# Patient Record
Sex: Female | Born: 1940 | Race: Black or African American | Hispanic: No | Marital: Married | Smoking: Never smoker
Health system: Southern US, Community
[De-identification: ages and names within clinical notes are randomized; demographics above are authoritative.]

## PROBLEM LIST (undated history)

## (undated) DIAGNOSIS — E079 Disorder of thyroid, unspecified: Secondary | ICD-10-CM

## (undated) DIAGNOSIS — G309 Alzheimer's disease, unspecified: Secondary | ICD-10-CM

## (undated) DIAGNOSIS — N189 Chronic kidney disease, unspecified: Secondary | ICD-10-CM

## (undated) DIAGNOSIS — M1990 Unspecified osteoarthritis, unspecified site: Secondary | ICD-10-CM

## (undated) DIAGNOSIS — I10 Essential (primary) hypertension: Secondary | ICD-10-CM

## (undated) DIAGNOSIS — M549 Dorsalgia, unspecified: Secondary | ICD-10-CM

## (undated) DIAGNOSIS — H5462 Unqualified visual loss, left eye, normal vision right eye: Secondary | ICD-10-CM

## (undated) DIAGNOSIS — N390 Urinary tract infection, site not specified: Secondary | ICD-10-CM

## (undated) DIAGNOSIS — E785 Hyperlipidemia, unspecified: Secondary | ICD-10-CM

## (undated) DIAGNOSIS — W19XXXA Unspecified fall, initial encounter: Secondary | ICD-10-CM

## (undated) DIAGNOSIS — R1310 Dysphagia, unspecified: Secondary | ICD-10-CM

## (undated) DIAGNOSIS — S42309A Unspecified fracture of shaft of humerus, unspecified arm, initial encounter for closed fracture: Secondary | ICD-10-CM

## (undated) DIAGNOSIS — E860 Dehydration: Secondary | ICD-10-CM

## (undated) DIAGNOSIS — D509 Iron deficiency anemia, unspecified: Secondary | ICD-10-CM

## (undated) DIAGNOSIS — F0390 Unspecified dementia without behavioral disturbance: Secondary | ICD-10-CM

## (undated) DIAGNOSIS — F0280 Dementia in other diseases classified elsewhere without behavioral disturbance: Secondary | ICD-10-CM

## (undated) DIAGNOSIS — M2550 Pain in unspecified joint: Secondary | ICD-10-CM

## (undated) DIAGNOSIS — R296 Repeated falls: Secondary | ICD-10-CM

## (undated) DIAGNOSIS — R6251 Failure to thrive (child): Secondary | ICD-10-CM

## (undated) DIAGNOSIS — N289 Disorder of kidney and ureter, unspecified: Secondary | ICD-10-CM

## (undated) DIAGNOSIS — M069 Rheumatoid arthritis, unspecified: Secondary | ICD-10-CM

## (undated) HISTORY — DX: Disorder of kidney and ureter, unspecified: N28.9

## (undated) HISTORY — DX: Hyperlipidemia, unspecified: E78.5

## (undated) HISTORY — DX: Pain in unspecified joint: M25.50

## (undated) HISTORY — PX: TOTAL ABDOMINAL HYSTERECTOMY: SHX209

## (undated) HISTORY — DX: Unspecified dementia, unspecified severity, without behavioral disturbance, psychotic disturbance, mood disturbance, and anxiety: F03.90

## (undated) HISTORY — PX: ESOPHAGOGASTRODUODENOSCOPY: SHX1529

## (undated) HISTORY — DX: Unqualified visual loss, left eye, normal vision right eye: H54.62

## (undated) HISTORY — PX: COLONOSCOPY: SHX174

---

## 2001-01-12 ENCOUNTER — Encounter (HOSPITAL_COMMUNITY): Admission: RE | Admit: 2001-01-12 | Discharge: 2001-02-11 | Payer: Self-pay | Admitting: Orthopedic Surgery

## 2001-10-09 ENCOUNTER — Inpatient Hospital Stay (HOSPITAL_COMMUNITY): Admission: AD | Admit: 2001-10-09 | Discharge: 2001-10-13 | Payer: Self-pay | Admitting: Family Medicine

## 2001-10-12 ENCOUNTER — Encounter: Payer: Self-pay | Admitting: Family Medicine

## 2002-01-30 ENCOUNTER — Encounter: Payer: Self-pay | Admitting: Internal Medicine

## 2002-01-30 ENCOUNTER — Emergency Department (HOSPITAL_COMMUNITY): Admission: EM | Admit: 2002-01-30 | Discharge: 2002-01-30 | Payer: Self-pay | Admitting: Internal Medicine

## 2002-03-04 ENCOUNTER — Ambulatory Visit (HOSPITAL_COMMUNITY): Admission: RE | Admit: 2002-03-04 | Discharge: 2002-03-04 | Payer: Self-pay | Admitting: Family Medicine

## 2002-03-04 ENCOUNTER — Encounter: Payer: Self-pay | Admitting: Family Medicine

## 2003-07-07 ENCOUNTER — Encounter (HOSPITAL_COMMUNITY): Admission: RE | Admit: 2003-07-07 | Discharge: 2003-08-06 | Payer: Self-pay | Admitting: Rheumatology

## 2004-10-31 ENCOUNTER — Emergency Department (HOSPITAL_COMMUNITY): Admission: EM | Admit: 2004-10-31 | Discharge: 2004-10-31 | Payer: Self-pay | Admitting: Emergency Medicine

## 2007-01-28 ENCOUNTER — Emergency Department (HOSPITAL_COMMUNITY): Admission: EM | Admit: 2007-01-28 | Discharge: 2007-01-28 | Payer: Self-pay | Admitting: Emergency Medicine

## 2007-10-22 ENCOUNTER — Ambulatory Visit (HOSPITAL_COMMUNITY): Admission: RE | Admit: 2007-10-22 | Discharge: 2007-10-22 | Payer: Self-pay | Admitting: Family Medicine

## 2008-04-15 ENCOUNTER — Encounter: Payer: Self-pay | Admitting: Orthopedic Surgery

## 2008-04-15 ENCOUNTER — Ambulatory Visit (HOSPITAL_COMMUNITY): Admission: RE | Admit: 2008-04-15 | Discharge: 2008-04-15 | Payer: Self-pay | Admitting: Family Medicine

## 2008-05-02 ENCOUNTER — Encounter: Payer: Self-pay | Admitting: Orthopedic Surgery

## 2008-05-03 ENCOUNTER — Ambulatory Visit: Payer: Self-pay | Admitting: Orthopedic Surgery

## 2008-05-03 DIAGNOSIS — M25569 Pain in unspecified knee: Secondary | ICD-10-CM | POA: Insufficient documentation

## 2008-06-19 ENCOUNTER — Emergency Department (HOSPITAL_COMMUNITY): Admission: EM | Admit: 2008-06-19 | Discharge: 2008-06-19 | Payer: Self-pay | Admitting: Emergency Medicine

## 2008-07-22 ENCOUNTER — Emergency Department (HOSPITAL_COMMUNITY): Admission: EM | Admit: 2008-07-22 | Discharge: 2008-07-22 | Payer: Self-pay | Admitting: Emergency Medicine

## 2009-04-11 ENCOUNTER — Encounter (INDEPENDENT_AMBULATORY_CARE_PROVIDER_SITE_OTHER): Payer: Self-pay | Admitting: *Deleted

## 2009-04-19 ENCOUNTER — Encounter: Payer: Self-pay | Admitting: Internal Medicine

## 2009-04-20 ENCOUNTER — Ambulatory Visit: Payer: Self-pay | Admitting: Internal Medicine

## 2009-04-20 ENCOUNTER — Encounter: Payer: Self-pay | Admitting: Internal Medicine

## 2009-04-20 ENCOUNTER — Ambulatory Visit (HOSPITAL_COMMUNITY): Admission: RE | Admit: 2009-04-20 | Discharge: 2009-04-20 | Payer: Self-pay | Admitting: Internal Medicine

## 2009-04-20 DIAGNOSIS — Z8719 Personal history of other diseases of the digestive system: Secondary | ICD-10-CM

## 2009-04-26 ENCOUNTER — Ambulatory Visit (HOSPITAL_COMMUNITY): Admission: RE | Admit: 2009-04-26 | Discharge: 2009-04-26 | Payer: Self-pay | Admitting: Family Medicine

## 2009-04-30 ENCOUNTER — Encounter: Payer: Self-pay | Admitting: Internal Medicine

## 2009-05-17 ENCOUNTER — Ambulatory Visit: Payer: Self-pay | Admitting: Internal Medicine

## 2009-05-17 DIAGNOSIS — A048 Other specified bacterial intestinal infections: Secondary | ICD-10-CM | POA: Insufficient documentation

## 2009-05-18 ENCOUNTER — Encounter: Payer: Self-pay | Admitting: Internal Medicine

## 2009-05-22 DIAGNOSIS — D509 Iron deficiency anemia, unspecified: Secondary | ICD-10-CM

## 2009-06-02 ENCOUNTER — Encounter (INDEPENDENT_AMBULATORY_CARE_PROVIDER_SITE_OTHER): Payer: Self-pay

## 2009-06-06 ENCOUNTER — Ambulatory Visit (HOSPITAL_COMMUNITY): Payer: Self-pay | Admitting: Oncology

## 2009-06-06 ENCOUNTER — Encounter (HOSPITAL_COMMUNITY): Admission: RE | Admit: 2009-06-06 | Discharge: 2009-07-05 | Payer: Self-pay | Admitting: Oncology

## 2009-06-26 ENCOUNTER — Ambulatory Visit (HOSPITAL_COMMUNITY): Admission: RE | Admit: 2009-06-26 | Discharge: 2009-06-26 | Payer: Self-pay | Admitting: Rheumatology

## 2009-09-16 ENCOUNTER — Inpatient Hospital Stay (HOSPITAL_COMMUNITY): Admission: EM | Admit: 2009-09-16 | Discharge: 2009-09-22 | Payer: Self-pay | Admitting: Emergency Medicine

## 2009-09-22 ENCOUNTER — Inpatient Hospital Stay: Admission: AD | Admit: 2009-09-22 | Discharge: 2009-10-23 | Payer: Self-pay | Admitting: Internal Medicine

## 2009-10-23 ENCOUNTER — Ambulatory Visit (HOSPITAL_COMMUNITY): Admission: RE | Admit: 2009-10-23 | Discharge: 2009-10-23 | Payer: Self-pay | Admitting: Internal Medicine

## 2009-11-27 ENCOUNTER — Encounter (HOSPITAL_COMMUNITY): Admission: RE | Admit: 2009-11-27 | Discharge: 2009-12-27 | Payer: Self-pay | Admitting: Specialist

## 2009-12-28 ENCOUNTER — Encounter (HOSPITAL_COMMUNITY): Admission: RE | Admit: 2009-12-28 | Discharge: 2010-01-27 | Payer: Self-pay | Admitting: Specialist

## 2010-03-21 ENCOUNTER — Ambulatory Visit (HOSPITAL_COMMUNITY): Admission: RE | Admit: 2010-03-21 | Discharge: 2010-03-21 | Payer: Self-pay | Admitting: Family Medicine

## 2010-04-02 ENCOUNTER — Ambulatory Visit (HOSPITAL_COMMUNITY): Admission: RE | Admit: 2010-04-02 | Discharge: 2010-04-02 | Payer: Self-pay | Admitting: Family Medicine

## 2010-05-18 ENCOUNTER — Ambulatory Visit (HOSPITAL_COMMUNITY): Admission: RE | Admit: 2010-05-18 | Discharge: 2010-05-18 | Payer: Self-pay | Admitting: Family Medicine

## 2010-06-01 ENCOUNTER — Encounter: Payer: Self-pay | Admitting: Internal Medicine

## 2010-07-05 ENCOUNTER — Ambulatory Visit (HOSPITAL_COMMUNITY): Admission: RE | Admit: 2010-07-05 | Discharge: 2010-07-05 | Payer: Self-pay | Admitting: Family Medicine

## 2010-08-06 ENCOUNTER — Telehealth (INDEPENDENT_AMBULATORY_CARE_PROVIDER_SITE_OTHER): Payer: Self-pay

## 2010-08-08 ENCOUNTER — Encounter (INDEPENDENT_AMBULATORY_CARE_PROVIDER_SITE_OTHER): Payer: Self-pay

## 2010-10-28 ENCOUNTER — Encounter: Payer: Self-pay | Admitting: Family Medicine

## 2010-10-29 ENCOUNTER — Encounter (HOSPITAL_COMMUNITY): Payer: Self-pay | Admitting: Oncology

## 2010-11-06 NOTE — Progress Notes (Signed)
Summary: repeat tcs?  Phone Note Outgoing Call   Summary of Call: pt is on recall list for repeat tcs. pt had tcs/egd 04/2009. When is pt due for repeat tcs? Initial call taken by: Hendricks Limes LPN,  August 06, 2010 2:57 PM     Appended Document: repeat tcs? see opt note append

## 2010-11-06 NOTE — Letter (Signed)
Summary: EMSI  EMSI   Imported By: Rexene Alberts 06/01/2010 15:15:26  _____________________________________________________________________  External Attachment:    Type:   Image     Comment:   External Document

## 2010-11-06 NOTE — Letter (Addendum)
Summary: Plan of Care, Need to Discuss  [facility]  [address]   [place], [place] [address]   Phone: [phone]  Fax: [phone]    August 08, 2010  KAYLAANN MOUNTZ [address] [place], [place]  [address] May 31, 1941   Dear Ms. Glasper,   We are writing this letter since we were unable to reach you by phone. Dr. Jena Gauss would like to know what kind of cancer your family had a history of. Please call us at [phone] as soon as possible and give [place] this information. Thank you very much and look forward to hearing  from you soon.     Sincerely,    Cloria Spring LPN  [facility] Ph: [phone]    Fax: [phone]   Appended Document: Plan of Care, Need to Discuss Per pt's husband no family history of cancer.

## 2010-12-22 LAB — GLUCOSE, CAPILLARY
Glucose-Capillary: 103 mg/dL — ABNORMAL HIGH (ref 70–99)
Glucose-Capillary: 103 mg/dL — ABNORMAL HIGH (ref 70–99)
Glucose-Capillary: 106 mg/dL — ABNORMAL HIGH (ref 70–99)
Glucose-Capillary: 106 mg/dL — ABNORMAL HIGH (ref 70–99)
Glucose-Capillary: 118 mg/dL — ABNORMAL HIGH (ref 70–99)
Glucose-Capillary: 130 mg/dL — ABNORMAL HIGH (ref 70–99)
Glucose-Capillary: 138 mg/dL — ABNORMAL HIGH (ref 70–99)
Glucose-Capillary: 140 mg/dL — ABNORMAL HIGH (ref 70–99)
Glucose-Capillary: 152 mg/dL — ABNORMAL HIGH (ref 70–99)
Glucose-Capillary: 152 mg/dL — ABNORMAL HIGH (ref 70–99)
Glucose-Capillary: 188 mg/dL — ABNORMAL HIGH (ref 70–99)
Glucose-Capillary: 243 mg/dL — ABNORMAL HIGH (ref 70–99)
Glucose-Capillary: 75 mg/dL (ref 70–99)
Glucose-Capillary: 89 mg/dL (ref 70–99)
Glucose-Capillary: 91 mg/dL (ref 70–99)
Glucose-Capillary: 92 mg/dL (ref 70–99)

## 2010-12-23 LAB — GLUCOSE, CAPILLARY: Glucose-Capillary: 104 mg/dL — ABNORMAL HIGH (ref 70–99)

## 2011-01-07 LAB — GLUCOSE, CAPILLARY
Glucose-Capillary: 103 mg/dL — ABNORMAL HIGH (ref 70–99)
Glucose-Capillary: 116 mg/dL — ABNORMAL HIGH (ref 70–99)
Glucose-Capillary: 117 mg/dL — ABNORMAL HIGH (ref 70–99)
Glucose-Capillary: 119 mg/dL — ABNORMAL HIGH (ref 70–99)
Glucose-Capillary: 122 mg/dL — ABNORMAL HIGH (ref 70–99)
Glucose-Capillary: 123 mg/dL — ABNORMAL HIGH (ref 70–99)
Glucose-Capillary: 128 mg/dL — ABNORMAL HIGH (ref 70–99)
Glucose-Capillary: 141 mg/dL — ABNORMAL HIGH (ref 70–99)
Glucose-Capillary: 154 mg/dL — ABNORMAL HIGH (ref 70–99)
Glucose-Capillary: 158 mg/dL — ABNORMAL HIGH (ref 70–99)
Glucose-Capillary: 161 mg/dL — ABNORMAL HIGH (ref 70–99)
Glucose-Capillary: 165 mg/dL — ABNORMAL HIGH (ref 70–99)
Glucose-Capillary: 188 mg/dL — ABNORMAL HIGH (ref 70–99)
Glucose-Capillary: 191 mg/dL — ABNORMAL HIGH (ref 70–99)
Glucose-Capillary: 204 mg/dL — ABNORMAL HIGH (ref 70–99)
Glucose-Capillary: 212 mg/dL — ABNORMAL HIGH (ref 70–99)
Glucose-Capillary: 219 mg/dL — ABNORMAL HIGH (ref 70–99)
Glucose-Capillary: 226 mg/dL — ABNORMAL HIGH (ref 70–99)
Glucose-Capillary: 233 mg/dL — ABNORMAL HIGH (ref 70–99)
Glucose-Capillary: 268 mg/dL — ABNORMAL HIGH (ref 70–99)
Glucose-Capillary: 45 mg/dL — ABNORMAL LOW (ref 70–99)
Glucose-Capillary: 74 mg/dL (ref 70–99)
Glucose-Capillary: 86 mg/dL (ref 70–99)
Glucose-Capillary: 88 mg/dL (ref 70–99)
Glucose-Capillary: 95 mg/dL (ref 70–99)
Glucose-Capillary: 97 mg/dL (ref 70–99)
Glucose-Capillary: 120 mg/dL — ABNORMAL HIGH (ref 70–99)
Glucose-Capillary: 169 mg/dL — ABNORMAL HIGH (ref 70–99)
Glucose-Capillary: 266 mg/dL — ABNORMAL HIGH (ref 70–99)
Glucose-Capillary: 74 mg/dL (ref 70–99)
Glucose-Capillary: 83 mg/dL (ref 70–99)

## 2011-01-07 LAB — BASIC METABOLIC PANEL
CO2: 20 mEq/L (ref 19–32)
Calcium: 8.9 mg/dL (ref 8.4–10.5)
Chloride: 111 mEq/L (ref 96–112)
GFR calc Af Amer: 43 mL/min — ABNORMAL LOW (ref >60)
Glucose, Bld: 98 mg/dL (ref 70–99)
Potassium: 4.3 mEq/L (ref 3.5–5.1)
Sodium: 139 mEq/L (ref 135–145)

## 2011-01-08 LAB — BASIC METABOLIC PANEL WITH GFR
BUN: 22 mg/dL (ref 6–23)
CO2: 21 meq/L (ref 19–32)
Calcium: 9.1 mg/dL (ref 8.4–10.5)
Chloride: 103 meq/L (ref 96–112)
Creatinine, Ser: 1.59 mg/dL — ABNORMAL HIGH (ref 0.4–1.2)
GFR calc non Af Amer: 32 mL/min — ABNORMAL LOW
Glucose, Bld: 549 mg/dL (ref 70–99)
Potassium: 5.4 meq/L — ABNORMAL HIGH (ref 3.5–5.1)
Sodium: 133 meq/L — ABNORMAL LOW (ref 135–145)

## 2011-01-08 LAB — GLUCOSE, CAPILLARY
Glucose-Capillary: 105 mg/dL — ABNORMAL HIGH (ref 70–99)
Glucose-Capillary: 110 mg/dL — ABNORMAL HIGH (ref 70–99)
Glucose-Capillary: 123 mg/dL — ABNORMAL HIGH (ref 70–99)
Glucose-Capillary: 126 mg/dL — ABNORMAL HIGH (ref 70–99)
Glucose-Capillary: 129 mg/dL — ABNORMAL HIGH (ref 70–99)
Glucose-Capillary: 131 mg/dL — ABNORMAL HIGH (ref 70–99)
Glucose-Capillary: 151 mg/dL — ABNORMAL HIGH (ref 70–99)
Glucose-Capillary: 155 mg/dL — ABNORMAL HIGH (ref 70–99)
Glucose-Capillary: 172 mg/dL — ABNORMAL HIGH (ref 70–99)
Glucose-Capillary: 178 mg/dL — ABNORMAL HIGH (ref 70–99)
Glucose-Capillary: 211 mg/dL — ABNORMAL HIGH (ref 70–99)
Glucose-Capillary: 241 mg/dL — ABNORMAL HIGH (ref 70–99)
Glucose-Capillary: 244 mg/dL — ABNORMAL HIGH (ref 70–99)
Glucose-Capillary: 248 mg/dL — ABNORMAL HIGH (ref 70–99)
Glucose-Capillary: 299 mg/dL — ABNORMAL HIGH (ref 70–99)
Glucose-Capillary: 436 mg/dL — ABNORMAL HIGH (ref 70–99)
Glucose-Capillary: 455 mg/dL — ABNORMAL HIGH (ref 70–99)
Glucose-Capillary: 564 mg/dL (ref 70–99)
Glucose-Capillary: 95 mg/dL (ref 70–99)
Glucose-Capillary: 96 mg/dL (ref 70–99)
Glucose-Capillary: 97 mg/dL (ref 70–99)

## 2011-01-08 LAB — COMPREHENSIVE METABOLIC PANEL
ALT: 14 U/L (ref 0–35)
AST: 17 U/L (ref 0–37)
Albumin: 2.9 g/dL — ABNORMAL LOW (ref 3.5–5.2)
Alkaline Phosphatase: 99 U/L (ref 39–117)
Chloride: 113 mEq/L — ABNORMAL HIGH (ref 96–112)
Creatinine, Ser: 1.3 mg/dL — ABNORMAL HIGH (ref 0.4–1.2)
GFR calc Af Amer: 50 mL/min — ABNORMAL LOW (ref >60)
Potassium: 4.6 mEq/L (ref 3.5–5.1)
Sodium: 141 mEq/L (ref 135–145)
Total Bilirubin: 0.6 mg/dL (ref 0.3–1.2)

## 2011-01-08 LAB — CBC
HCT: 27 % — ABNORMAL LOW (ref 36.0–46.0)
HCT: 31.2 % — ABNORMAL LOW (ref 36.0–46.0)
Hemoglobin: 10.6 g/dL — ABNORMAL LOW (ref 12.0–15.0)
Hemoglobin: 9.3 g/dL — ABNORMAL LOW (ref 12.0–15.0)
MCHC: 34.1 g/dL (ref 30.0–36.0)
MCHC: 34.3 g/dL (ref 30.0–36.0)
MCV: 83.6 fL (ref 78.0–100.0)
MCV: 83.8 fL (ref 78.0–100.0)
Platelets: 214 K/uL (ref 150–400)
Platelets: 234 10*3/uL (ref 150–400)
RBC: 3.23 MIL/uL — ABNORMAL LOW (ref 3.87–5.11)
RBC: 3.72 MIL/uL — ABNORMAL LOW (ref 3.87–5.11)
RDW: 14.1 % (ref 11.5–15.5)
RDW: 14.1 % (ref 11.5–15.5)
WBC: 13.8 K/uL — ABNORMAL HIGH (ref 4.0–10.5)
WBC: 14.5 10*3/uL — ABNORMAL HIGH (ref 4.0–10.5)

## 2011-01-08 LAB — URINALYSIS, ROUTINE W REFLEX MICROSCOPIC
Bilirubin Urine: NEGATIVE
Glucose, UA: >1000 mg/dL — AB
Hgb urine dipstick: NEGATIVE
Ketones, ur: NEGATIVE mg/dL
Nitrite: NEGATIVE
Protein, ur: NEGATIVE mg/dL
Specific Gravity, Urine: 1.017 (ref 1.005–1.030)
Urobilinogen, UA: 0.2 mg/dL (ref 0.0–1.0)
pH: 6 (ref 5.0–8.0)

## 2011-01-08 LAB — DIFFERENTIAL
Basophils Absolute: 0 10*3/uL (ref 0.0–0.1)
Eosinophils Absolute: 0 10*3/uL (ref 0.0–0.7)
Lymphocytes Relative: 7 % — ABNORMAL LOW (ref 12–46)
Lymphs Abs: 1.1 10*3/uL (ref 0.7–4.0)
Neutrophils Relative %: 88 % — ABNORMAL HIGH (ref 43–77)

## 2011-01-08 LAB — BASIC METABOLIC PANEL
BUN: 29 mg/dL — ABNORMAL HIGH (ref 6–23)
Calcium: 9.2 mg/dL (ref 8.4–10.5)
Chloride: 112 mEq/L (ref 96–112)
GFR calc Af Amer: 40 mL/min — ABNORMAL LOW (ref >60)
GFR calc non Af Amer: 23 mL/min — ABNORMAL LOW (ref >60)
Glucose, Bld: 153 mg/dL — ABNORMAL HIGH (ref 70–99)
Potassium: 4.4 mEq/L (ref 3.5–5.1)

## 2011-01-08 LAB — LIPID PANEL
Cholesterol: 181 mg/dL (ref 0–200)
LDL Cholesterol: 109 mg/dL — ABNORMAL HIGH (ref 0–99)
Total CHOL/HDL Ratio: 3.4 RATIO
Triglycerides: 92 mg/dL (ref <150)

## 2011-01-08 LAB — GLUCOSE, RANDOM: Glucose, Bld: 432 mg/dL — ABNORMAL HIGH (ref 70–99)

## 2011-01-08 LAB — HEMOGLOBIN A1C: Hgb A1c MFr Bld: 12.3 % — ABNORMAL HIGH (ref 4.6–6.1)

## 2011-01-08 LAB — RETICULOCYTES
RBC.: 3.4 MIL/uL — ABNORMAL LOW (ref 3.87–5.11)
Retic Count, Absolute: 54.4 K/uL (ref 19.0–186.0)
Retic Ct Pct: 1.6 % (ref 0.4–3.1)

## 2011-01-08 LAB — TSH: TSH: 1.706 u[IU]/mL (ref 0.350–4.500)

## 2011-01-08 LAB — URINE MICROSCOPIC-ADD ON

## 2011-01-08 LAB — KETONES, QUALITATIVE: Acetone, Bld: NEGATIVE

## 2011-01-08 LAB — FERRITIN: Ferritin: 121 ng/mL (ref 10–291)

## 2011-01-08 LAB — HIV ANTIBODY (ROUTINE TESTING W REFLEX): HIV: NONREACTIVE

## 2011-01-10 ENCOUNTER — Other Ambulatory Visit (HOSPITAL_COMMUNITY): Payer: Self-pay | Admitting: Family Medicine

## 2011-01-10 DIAGNOSIS — F09 Unspecified mental disorder due to known physiological condition: Secondary | ICD-10-CM

## 2011-01-12 LAB — FERRITIN: Ferritin: 134 ng/mL (ref 10–291)

## 2011-01-12 LAB — COMPREHENSIVE METABOLIC PANEL
ALT: 18 U/L (ref 0–35)
AST: 23 U/L (ref 0–37)
Albumin: 3.7 g/dL (ref 3.5–5.2)
CO2: 21 mEq/L (ref 19–32)
Calcium: 9.8 mg/dL (ref 8.4–10.5)
GFR calc Af Amer: 51 mL/min — ABNORMAL LOW (ref >60)
GFR calc non Af Amer: 42 mL/min — ABNORMAL LOW (ref >60)
Sodium: 142 mEq/L (ref 135–145)
Total Protein: 7.8 g/dL (ref 6.0–8.3)

## 2011-01-12 LAB — DIFFERENTIAL
Eosinophils Absolute: 0.1 10*3/uL (ref 0.0–0.7)
Eosinophils Relative: 1 % (ref 0–5)
Lymphs Abs: 4.9 10*3/uL — ABNORMAL HIGH (ref 0.7–4.0)
Monocytes Absolute: 0.6 10*3/uL (ref 0.1–1.0)
Monocytes Relative: 4 % (ref 3–12)

## 2011-01-12 LAB — RETICULOCYTES
RBC.: 3.81 MIL/uL — ABNORMAL LOW (ref 3.87–5.11)
Retic Ct Pct: 0.9 % (ref 0.4–3.1)

## 2011-01-12 LAB — IRON AND TIBC
Saturation Ratios: 26 % (ref 20–55)
UIBC: 231 ug/dL

## 2011-01-12 LAB — CBC
MCHC: 34.4 g/dL (ref 30.0–36.0)
Platelets: 279 10*3/uL (ref 150–400)
RBC: 3.81 MIL/uL — ABNORMAL LOW (ref 3.87–5.11)

## 2011-01-12 LAB — HEMOGLOBINOPATHY EVALUATION
Hgb A2 Quant: 2.9 % (ref 2.2–3.2)
Hgb S Quant: 39.3 % — ABNORMAL HIGH (ref 0.0–0.0)

## 2011-01-12 LAB — LACTATE DEHYDROGENASE: LDH: 164 U/L (ref 94–250)

## 2011-01-12 LAB — DIRECT ANTIGLOBULIN TEST (NOT AT ARMC)
DAT, IgG: NEGATIVE
DAT, complement: NEGATIVE

## 2011-01-12 LAB — HAPTOGLOBIN: Haptoglobin: 321 mg/dL — ABNORMAL HIGH (ref 16–200)

## 2011-01-17 ENCOUNTER — Ambulatory Visit (HOSPITAL_COMMUNITY): Payer: Medicare Other

## 2011-01-24 ENCOUNTER — Encounter (HOSPITAL_COMMUNITY): Payer: Self-pay

## 2011-01-24 ENCOUNTER — Ambulatory Visit (HOSPITAL_COMMUNITY)
Admission: RE | Admit: 2011-01-24 | Discharge: 2011-01-24 | Disposition: A | Discharge status: Home / Self Care | Payer: Medicare Other | Source: Ambulatory Visit | Attending: Family Medicine | Admitting: Family Medicine

## 2011-01-24 DIAGNOSIS — G319 Degenerative disease of nervous system, unspecified: Secondary | ICD-10-CM | POA: Insufficient documentation

## 2011-01-24 DIAGNOSIS — E119 Type 2 diabetes mellitus without complications: Secondary | ICD-10-CM | POA: Insufficient documentation

## 2011-01-24 DIAGNOSIS — R4182 Altered mental status, unspecified: Secondary | ICD-10-CM | POA: Insufficient documentation

## 2011-01-24 DIAGNOSIS — F09 Unspecified mental disorder due to known physiological condition: Secondary | ICD-10-CM

## 2011-02-19 NOTE — Op Note (Signed)
Caitlin Riddle, Caitlin Riddle             ACCOUNT NO.:  [id number]   MEDICAL RECORD NO.:  [id number]          PATIENT TYPE:  AMB   LOCATION:  DAY                           FACILITY:  [facility]   PHYSICIAN:  R. Roetta Sessions, M.D. DATE OF BIRTH:  07-Aug-1941   DATE OF PROCEDURE:  04/20/2009  DATE OF DISCHARGE:                               OPERATIVE REPORT   PROCEDURES:  Diagnostic colonoscopy followed by EGD biopsy.   INDICATIONS FOR PROCEDURE:  A 70 year old [place] American female with  iron-deficiency anemia, some weight loss and vague early satiety  symptoms.  She is not experiencing melena, hematemesis or hematochezia.  Colonoscopy is now being done, with EGD to follow.  The risks, benefits,  alternatives, and limitations have been discussed and questions  answered.  Please see the documentation in the medical record.   PROCEDURE NOTE:  Her O2 saturation, blood pressure, pulse, and  respirations were monitored throughout the entire procedure.   MEDICATIONS:  Conscious sedation Versed 6 mg IV and Demerol 75 mg IV in  divided doses.   INSTRUMENT:  Pentax video chip system.   FINDINGS ON COLONOSCOPY:  Digital rectal exam revealed no abnormalities.  The prep was good.  The colonic mucosa was surveyed from the  rectosigmoid junction through the left transverse and right colon, the  appendiceal orifice, ileocecal valve/cecum.  These structures were well  seen and photographed for the record.  From this level, the scope was  slowly and cautiously withdrawn.  All previously mentioned mucosal  surfaces were again seen.  The colonic mucosa appeared normal.  I did  not note any diverticula, although some diverticula were noted on the  left side on a prior exam some 10 years ago.  The scope was pulled down  into the rectum, with thorough examination of the rectal mucosa,  including retroflexed view of the anal verge demonstrated no  abnormalities.  Cecal withdrawal time was 7 minutes.   EGD:   Subsequent EGD has been performed.  Examination of the tubular  esophagus revealed no mucosal abnormalities and was easily traversed  into the stomach and the gastric cavity was empty and insufflated well  with air.  Thorough examination of the gastric mucosa, including  retroflexion of proximal stomach and esophagogastric junction  demonstrated only a small hiatal hernia and a couple of tiny antral  erosions.  There was no ulcer infiltrating process.  Pylorus patent,  easily traversed and examination of the bulb and second portion revealed  no abnormalities.  Therapeutic/diagnostic maneuvers were performed.  Antral biopsies were taken for histologic study.  The patient tolerated  both procedures and was reactive after endoscopy.   IMPRESSION:  1. Colonoscopy:  Normal rectum. Normal colon.  2. EGD:  Normal esophagus.  Small hiatal hernia.  Tiny antral erosions      of uncertain significance, status post biopsy.  Otherwise normal      stomach, patent pylorus, normal duodenum.   RECOMMENDATIONS:  1. Follow up on path.  2. Further recommendations to follow.      Jonathon Bellows, M.D.  Electronically Signed     RMR/MEDQ  D:  04/20/2009  T:  04/20/2009  Job:  [id number]   cc:   Lorin Picket A. Gerda Diss, MD  Fax: [phone]

## 2011-02-22 NOTE — Discharge Summary (Signed)
[facility]  Patient:    Caitlin Riddle, Caitlin Riddle Visit Number: [id number] MRN: [id number]          Service Type: MED Location: 3A A326 01 Attending Physician:  Harlow Asa Dictated by:   Lilyan Punt, M.D. Admit Date:  10/09/2001 Discharge Date: 10/13/2001                             Discharge Summary  DISCHARGE DIAGNOSIS:  Pyelonephritis.  HOSPITAL COURSE:  A 70 year old black female with diabetes admitted after a several day history of back pain, fever, vomiting, and weakness.  She had been seen approximately 1 week prior to admission in the hospital in [place] and they diagnosed a UTI for which she was given IV fluids and started on Cipro 500 b.i.d.  She had taken 1 weeks worth of this when she presented to the office in fever and discomfort and distress.  She had a temperature of 100.2, and she was felt to be mildly dehydrated and also had flank tenderness but the abdomen itself was soft.  WBC initially 20,000 with a left shift and urinalysis showed multiple WBCs. Urine culture came back being negative but it should be of note that the patient had been on Cipro for 1 week before this was taken.  She did have an ultrasound while in the hospital and that did not show any perinephric abscess and did not show any gallstones.  It did show some small hepatic cysts of nonconsequence.  WBCs were repeated several times and showed a moderate decline to 13,000 on the time of discharge.  The patient was afebrile at the time of discharge.  MEDICATIONS: 1. She was instructed to take Augmentin 875 b.i.d. with a snack for 7 days    along with her 2. Glynase 3 mg q.a.m. and 1/2 of this q. supper. Once her sugars have come    down to a range she normally has them she is to revert back to just Glynase    3 mg q.a.m. 3. The lipitor she may restart in 1 week.  FOLLOWUP:  She is to follow up in the office in approximately 2 weeks or sooner if any problems. Dictated  by:   Lilyan Punt, M.D. Attending Physician:  Harlow Asa DD:  10/13/01 TD:  10/13/01 Job: [id number]

## 2011-02-22 NOTE — Consult Note (Signed)
NAMEKAROLYNE, TIMMONS                         ACCOUNT NO.:  [id number]   MEDICAL RECORD NO.:  [id number]                  PATIENT TYPE:   LOCATION:                                       FACILITY:   PHYSICIAN:  Aundra Dubin, M.D.            DATE OF BIRTH:   DATE OF CONSULTATION:  07/07/2003  DATE OF DISCHARGE:                                   CONSULTATION   CHIEF COMPLAINT:  Hands and wrists.   HISTORY:  Dr. Lorin Picket,  Thank you for allowing me to help in Ms. Bina's care.  She is a 70-year-  old black female with a history of NIDDM.  In June, she started becoming  sore and aching in the sides of her neck, shoulders, and both hands and  wrists.  By early August, she was seen by Dr. Gerda Diss who felt that she had  arthritis, particularly involving her hands.  There was concern that this  may be RA.  He placed her on prednisone at a small dose with some  improvement, but she continued to have some achiness and soreness.  Approximately 10 days ago, she had to go to an emergency room because she  was having hives, itching, throat and tongue swelling, along with her lips  swelling.  There was no shortness of breath.  An IV was placed and she was  given some medicine which I suspect contained steroids.  She was felt to  have an allergic reaction, but the cause was not known.  She is some better  at this time.  Laboratories on May 09, 2003, showed a negative RF and an  ESR 99.   REVIEW OF SYSTEMS:  On further review of systems, she does not feel she has  gained any appreciable weight, but does feel puffy.  Her energy level is  somewhat low.  She has some nonrestorative sleep.  She is stiff in the  mornings for about an hour.  She denies fever or rashes, other than the  hives from last week.  There has been no psoriasis, photosensitivity, oral  ulcers, alopecia, pleurisy, headaches, or Raynaud's.  She has some  constipation, but denies diarrhea or blood in the mucous.  She has had  no  chest pain.  Her feet, ankles, and knees do not bother her.   PAST MEDICAL AND SURGICAL HISTORY:  Hysterectomy, diabetes for nine years,  arthritis as above.   MEDICATIONS:  1. Prednisone 5 mg daily.  2. Lipitor 20 mg daily.  3. Avandia 4 mg daily.  4. Naproxen sodium p.r.n.  5. Calcium 600 mg b.i.d.   DRUG INTOLERANCES:  None.   FAMILY HISTORY:  Her father died of some type of cancer.  Her mother is also  deceased with complications of diabetes.  She has three brothers and one  sister who have had various cancers.   SOCIAL HISTORY:  She is here today with her husband.  She has one child from  a previous relationship.  She has been laid of from [employer] and  is not presently working.  She did not smoke heavily and has not smoked any  for greater than 17 years.  No alcohol.   PHYSICAL EXAMINATION:  VITAL SIGNS:  Weight 143 pounds, height 5 feet 2  inches, blood pressure 120/70, respirations 16.  GENERAL:  She is mildly overweight, but is in no distress.  SKIN:  There are no active rashes and the skin is clear.  No hives.  HEENT:  EOMI.  Mouth clear.  NECK:  No adenopathy, normal thyroid.  LUNGS:  Clear.  HEART:  Regular, no murmur.  ABDOMEN:  Negative HSM, nontender.  MUSCULOSKELETAL:  The hands have a swollen appearance.  They are not overtly  swollen, but there is puffiness.  She is tender across the MCPs bilaterally,  and the wrists also have slight warmth, fullness, and tenderness.  Elbows,  good range of motion.  The shoulders move stiffly.  She can perform internal  and external rotations almost fully.  Abduction is stiff, uncomfortable, and  limited to 130 degrees bilaterally.  Trigger points around the shoulder,  neck, occiput, anterior chest, and upper paraspinous muscles were nontender.  Low back was nontender.  Hips, good range of motion.  The knees were cool  and flexed easily to 135 degrees.  Ankles were nonswollen and nontender.  There was no  tenderness with compression across the MTPs.  NEURO:  Strength is 5/5, DTRs are 2+ throughout, negative SLRs.   ASSESSMENT AND PLAN:  1. Symmetric polyarthritis.  The pattern that I see involves primarily today     hands, wrists, and shoulders to some degree.  She is likely better than I     would have seen a few days ago and she has received a steroid injection.     I have explained to them that I would need to see the swelling more fully     in a pattern that is consistent with rheumatoid arthritis before I would     treat it more vigorously.  The plan is for her to stay on prednisone 5 mg     daily until July 17, 2003.  She will then take 5 mg every other day     for four doses and stop the medicine.  The understanding in knowing that     she may worsen to a degree, but I have explained that if this is     rheumatoid arthritis, then at the point that we change the medicine, she     will likely feel better in a few days.  If this is consistent with     rheumatoid arthritis, then we will move on to a remitting medicine.  I     will not x-ray the hands at this time.  2. Diabetes.  This is always problematic with the potential use of     prednisone.  3. Hysterectomy.  She is taking calcium.   DISCUSSION:  Lorin Picket, as you known, Ms. Engdahl is coming to [place] from  [place].  My clinic is stopping at the end of October, which I have  explained this to them.  I am setting them up for a return visit with me in  [place] for early November.  If she worsens before then, then I may see  her at [facility].  I have also discussed, with them, that there is a  rheumatologist in  [place], and, if needed, she could follow up with him.  I  will be glad to work with them and appreciate the opportunity to do so.   Thank you, sincerely,      ___________________________________________                                           Aundra Dubin, M.D.   WWT/MEDQ  D:  07/07/2003  T:  07/07/2003   Job:  [id number]

## 2011-02-22 NOTE — H&P (Signed)
[facility]  Patient:    Caitlin Riddle, Caitlin Riddle Visit Number: [id number] MRN: [id number]          Service Type: MED Location: 3A A326 01 Attending Physician:  Harlow Asa Dictated by:   Donna Bernard, M.D. Admit Date:  10/09/2001                           History and Physical  CHIEF COMPLAINT:  Back pain, fever, vomiting, weakness.  HISTORY OF PRESENT ILLNESS:  This patient is a 70 year old black female, with a history of type 2 diabetes, hyperlipidemia, who presented to the office in significant distress.  She notes approximately a week prior to admission she went to the hospital in [place], [place].  They diagnosed urinary tract infection.  She was given IV fluids and started on Cipro 500 mg b.i.d.  She has taken a one week course, finished her last tablet this morning.  She states she is feeling terrible.  She has had episodes of vomiting and nausea. She notes frequent urination.  Her sugars are in poor control, often ranging over 400.  The patient has felt achy and extremely weak.  The patient claims compliance with medications.  MEDICATIONS:  1.  Glynase 3 mg p.o. q.d.  2.  Lipitor 20 mg p.o. q.d.  3.  Cipro 500 mg b.i.d. last seven days.  PAST SURGICAL HISTORY:  1.  Remote oophorectomy.  2.  Hysterectomy.  FAMILY HISTORY:  Positive for hypertension, diabetes, and colon cancer.  SOCIAL HISTORY:  The patient is married and has one child.  No tobacco use. No alcohol use.  REVIEW OF SYSTEMS:  Otherwise negative.  PHYSICAL EXAMINATION:  VITAL SIGNS:  TEMP 100.2 degrees.  GENERAL:  The patient is alert, with significant malaise.  Oriented x 3.  HEENT:  Normal.  Mucous membranes quite dry.  NECK:  Supple.  LUNGS:  Clear.  HEART:  Slight tachycardia.  No significant murmurs.  Significant right CVA tenderness.  Minimal left CVA tenderness.  ABDOMEN:  Bowel sounds present.  No organomegaly.  No rebound.  No guarding.  SKIN:  Dry  and tenting.  EXTREMITIES:  Thin.  LABORATORY DATA:  WBC 20,000 with left shift.  Urinalysis, multiple wbc/hpf. Blood culture, urine culture obtained.  MET 7 potassium normal.  Please see chart for further numbers.  IMPRESSION:  1.  Pyelonephritis with failure on outpatient management.  2.  Type 2 diabetes, poor control, aggravating management of #1.  3.  Dehydration secondary to #1 and #2.  4.  Hyperlipidemia.  PLAN:  Admit for IV fluids, IV antibiotics.  Further orders as noted on the chart. Dictated by:   Donna Bernard, M.D. Attending Physician:  Harlow Asa DD:  10/09/01 TD:  10/10/01 Job: [id number]

## 2011-11-12 ENCOUNTER — Other Ambulatory Visit: Payer: Self-pay | Admitting: Family Medicine

## 2011-11-12 ENCOUNTER — Ambulatory Visit (HOSPITAL_COMMUNITY)
Admission: RE | Admit: 2011-11-12 | Discharge: 2011-11-12 | Disposition: A | Discharge status: Home / Self Care | Payer: Medicare Other | Source: Ambulatory Visit | Attending: Family Medicine | Admitting: Family Medicine

## 2011-11-12 DIAGNOSIS — R05 Cough: Secondary | ICD-10-CM | POA: Insufficient documentation

## 2011-11-12 DIAGNOSIS — R059 Cough, unspecified: Secondary | ICD-10-CM | POA: Insufficient documentation

## 2011-11-12 DIAGNOSIS — J189 Pneumonia, unspecified organism: Secondary | ICD-10-CM

## 2011-11-12 DIAGNOSIS — E119 Type 2 diabetes mellitus without complications: Secondary | ICD-10-CM | POA: Insufficient documentation

## 2011-12-10 ENCOUNTER — Emergency Department (HOSPITAL_COMMUNITY): Payer: Medicare Other

## 2011-12-10 ENCOUNTER — Encounter (HOSPITAL_COMMUNITY): Payer: Self-pay

## 2011-12-10 ENCOUNTER — Inpatient Hospital Stay (HOSPITAL_COMMUNITY)
Admission: EM | Admit: 2011-12-10 | Discharge: 2011-12-12 | DRG: 690 | Disposition: A | Discharge status: Home / Self Care | Payer: Medicare Other | Attending: Internal Medicine | Admitting: Internal Medicine

## 2011-12-10 DIAGNOSIS — M1711 Unilateral primary osteoarthritis, right knee: Secondary | ICD-10-CM

## 2011-12-10 DIAGNOSIS — N390 Urinary tract infection, site not specified: Principal | ICD-10-CM | POA: Diagnosis present

## 2011-12-10 DIAGNOSIS — D72829 Elevated white blood cell count, unspecified: Secondary | ICD-10-CM | POA: Diagnosis present

## 2011-12-10 DIAGNOSIS — M25569 Pain in unspecified knee: Secondary | ICD-10-CM | POA: Diagnosis present

## 2011-12-10 DIAGNOSIS — E119 Type 2 diabetes mellitus without complications: Secondary | ICD-10-CM | POA: Diagnosis present

## 2011-12-10 DIAGNOSIS — M069 Rheumatoid arthritis, unspecified: Secondary | ICD-10-CM | POA: Diagnosis present

## 2011-12-10 DIAGNOSIS — I10 Essential (primary) hypertension: Secondary | ICD-10-CM | POA: Diagnosis present

## 2011-12-10 DIAGNOSIS — M1710 Unilateral primary osteoarthritis, unspecified knee: Secondary | ICD-10-CM | POA: Diagnosis present

## 2011-12-10 DIAGNOSIS — R509 Fever, unspecified: Secondary | ICD-10-CM | POA: Diagnosis present

## 2011-12-10 DIAGNOSIS — F068 Other specified mental disorders due to known physiological condition: Secondary | ICD-10-CM | POA: Diagnosis present

## 2011-12-10 DIAGNOSIS — E860 Dehydration: Secondary | ICD-10-CM | POA: Diagnosis present

## 2011-12-10 DIAGNOSIS — F0390 Unspecified dementia without behavioral disturbance: Secondary | ICD-10-CM | POA: Diagnosis present

## 2011-12-10 HISTORY — DX: Essential (primary) hypertension: I10

## 2011-12-10 HISTORY — DX: Unspecified osteoarthritis, unspecified site: M19.90

## 2011-12-10 LAB — URINALYSIS, ROUTINE W REFLEX MICROSCOPIC
Glucose, UA: >1000 mg/dL — AB
Specific Gravity, Urine: 1.01 (ref 1.005–1.030)

## 2011-12-10 LAB — DIFFERENTIAL
Basophils Absolute: 0 10*3/uL (ref 0.0–0.1)
Eosinophils Relative: 0 % (ref 0–5)
Lymphocytes Relative: 8 % — ABNORMAL LOW (ref 12–46)
Lymphs Abs: 1.4 10*3/uL (ref 0.7–4.0)
Monocytes Absolute: 1 10*3/uL (ref 0.1–1.0)
Neutro Abs: 15.4 10*3/uL — ABNORMAL HIGH (ref 1.7–7.7)

## 2011-12-10 LAB — COMPREHENSIVE METABOLIC PANEL
AST: 18 U/L (ref 0–37)
Albumin: 3.2 g/dL — ABNORMAL LOW (ref 3.5–5.2)
Alkaline Phosphatase: 119 U/L — ABNORMAL HIGH (ref 39–117)
CO2: 21 mEq/L (ref 19–32)
Chloride: 101 mEq/L (ref 96–112)
Creatinine, Ser: 1.3 mg/dL — ABNORMAL HIGH (ref 0.50–1.10)
GFR calc non Af Amer: 41 mL/min — ABNORMAL LOW (ref >90)
Potassium: 3.8 mEq/L (ref 3.5–5.1)
Total Bilirubin: 0.3 mg/dL (ref 0.3–1.2)

## 2011-12-10 LAB — CBC
HCT: 32.6 % — ABNORMAL LOW (ref 36.0–46.0)
MCV: 78.9 fL (ref 78.0–100.0)
Platelets: 332 10*3/uL (ref 150–400)
RBC: 4.13 MIL/uL (ref 3.87–5.11)
WBC: 17.9 10*3/uL — ABNORMAL HIGH (ref 4.0–10.5)

## 2011-12-10 LAB — URINE MICROSCOPIC-ADD ON

## 2011-12-10 MED ORDER — SODIUM CHLORIDE 0.9 % IV BOLUS (SEPSIS)
1000.0000 mL | Freq: Once | INTRAVENOUS | Status: AC
  Administered 2011-12-10: 1000 mL via INTRAVENOUS

## 2011-12-10 MED ORDER — DEXTROSE 5 % IV SOLN
INTRAVENOUS | Status: AC
  Filled 2011-12-10: qty 10

## 2011-12-10 MED ORDER — SODIUM CHLORIDE 0.9 % IV SOLN: INTRAVENOUS | Status: DC

## 2011-12-10 MED ORDER — DEXTROSE 5 % IV SOLN
1.0000 g | Freq: Once | INTRAVENOUS | Status: AC
  Administered 2011-12-10: 1 g via INTRAVENOUS
  Filled 2011-12-10: qty 10

## 2011-12-10 MED ORDER — ACETAMINOPHEN 325 MG PO TABS
650.0000 mg | ORAL_TABLET | Freq: Once | ORAL | Status: AC
  Administered 2011-12-10: 650 mg via ORAL

## 2011-12-10 MED ORDER — ACETAMINOPHEN 325 MG PO TABS
ORAL_TABLET | ORAL | Status: AC
  Administered 2011-12-10: 650 mg via ORAL
  Filled 2011-12-10: qty 2

## 2011-12-10 NOTE — ED Provider Notes (Signed)
History  This chart was scribed for Caitlin Lennert, MD by Bennett Scrape. This patient was seen in room [facility] and the patient's care was started at 9:08PM.  CSN: [id number]  Arrival date & time 12/10/11  2030   First MD Initiated Contact with Patient 12/10/11 2105     Level 5 Caveat-Pt has Alzheimer's Disease.   Chief Complaint  Patient presents with  . Fatigue    Patient is a 71 y.o. female presenting with seizures. The history is provided by a relative. No language interpreter was used.  Seizures  This is a new problem. The current episode started less than 1 hour ago. The problem has not changed since onset.There was 1 seizure. The most recent episode lasted 30 to 120 seconds. Associated symptoms include sleepiness and confusion. Characteristics include rhythmic jerking (of bilateral hands). The episode was witnessed. The seizures did not continue in the ED. The maximum temperature recorded prior to her arrival was 103 to 104 F.   Caitlin Riddle is a 71 y.o. female who presents to the Emergency Department complaining of confusion and fatigue after a seizure like episode that occurred about one hour PTA. Pt also has a fever of 103.1 upon arrival to the ED. Per son, pt was watching TV when she appeared to "zone out" and her hands began shaking uncontrollably. Son states that the episode lasted for about a minute. He reports that the pt is still fatigued and mildly confused. She is not oriented to person or time upon arrival to the ED. Son denies any previous episodes of similar symptoms. The pt also has a h/o diabetes and HTN. Son denies current smoking and alcohol use.  Dr. Gerda Diss is Pt's PCP.  Past Medical History  Diagnosis Date  . Diabetes mellitus   . Hypertension   . Arthritis     History reviewed. No pertinent past surgical history.  No family history on file.  History  Substance Use Topics  . Smoking status: Not on file  . Smokeless tobacco: Not on file  .  Alcohol Use:     Review of Systems  Unable to perform ROS: Other  Constitutional: Positive for fever and fatigue.  Neurological: Positive for seizures.  Psychiatric/Behavioral: Positive for confusion.    Allergies  Review of patient's allergies indicates no known allergies.  Home Medications  No current outpatient prescriptions on file.  Triage Vitals: BP 158/82  Pulse 122  Temp(Src) 103.1 F (39.5 C) (Oral)  Resp 20  SpO2 98%  Physical Exam  Nursing note and vitals reviewed. Constitutional: She appears well-developed.  HENT:  Head: Normocephalic and atraumatic.  Eyes: Conjunctivae and EOM are normal. No scleral icterus.       Caitlin Riddle is clouded out on the left side; pt is blind in the left eye  Neck: Neck supple. No thyromegaly present.  Cardiovascular: Normal rate and regular rhythm.  Exam reveals no gallop and no friction rub.   No murmur heard. Pulmonary/Chest: No stridor. She has no wheezes. She has no rales. She exhibits no tenderness.  Abdominal: She exhibits no distension. There is no tenderness. There is no rebound.  Musculoskeletal: Normal range of motion. She exhibits no edema.  Lymphadenopathy:    She has no cervical adenopathy.  Neurological: Coordination normal.       Mildly confused; oriented to place but no person or time  Skin: No rash noted. No erythema.  Psychiatric: She has a normal mood and affect. Her behavior is normal.  ED Course  Procedures (including critical care time)  DIAGNOSTIC STUDIES: Oxygen Saturation is 98% on room air, normal by my interpretation.    COORDINATION OF CARE: 9:13PM-Discussed treatment plan with son and son agreed to plan. 11:02PM-Pt rechecked and she is awake, alert and wants to go home. Discussed radiology results with family and family acknowledged results.     Labs Reviewed  URINALYSIS, ROUTINE W REFLEX MICROSCOPIC  CBC  DIFFERENTIAL  COMPREHENSIVE METABOLIC PANEL   Caitlin Riddle Contrast  12/10/2011   *RADIOLOGY REPORT*  Clinical Data:   Seizure.  MRI HEAD WITHOUT CONTRAST  Technique:  Multiplanar, multiecho pulse sequences of the brain and surrounding structures were obtained according to standard protocol without intravenous contrast.  Comparison: Head CT 01/24/2011  Findings: The study is degraded by motion.  Diffusion imaging does not show any acute or subacute infarction.  The brainstem and cerebellum appear unremarkable.  The cerebral hemispheres show generalized atrophy but there is no evidence of focal infarction, mass lesion, hemorrhage, hydrocephalus or extra-axial collection. The calvarium is unremarkable.  There are some mucosal inflammatory changes of the sphenoid sinus.  IMPRESSION: Motion degraded exam.  No acute or focal finding.  Generalized brain atrophy.  Original Report Authenticated By: Thomasenia Sales, M.D.     No diagnosis found.    MDM  Admit, for uti     The chart was scribed for me under my direct supervision.  I personally performed the history, physical, and medical decision making and all procedures in the evaluation of this patient.Caitlin Lennert, MD 12/10/11 [phone]

## 2011-12-10 NOTE — ED Notes (Signed)
Pt from home where family report pt has been more sleepy lately.   Pt denies complaints, nad on arrival

## 2011-12-10 NOTE — ED Notes (Signed)
Pt transported to radiology for MRI 

## 2011-12-11 ENCOUNTER — Encounter (HOSPITAL_COMMUNITY): Payer: Self-pay | Admitting: *Deleted

## 2011-12-11 DIAGNOSIS — D72829 Elevated white blood cell count, unspecified: Secondary | ICD-10-CM | POA: Diagnosis present

## 2011-12-11 DIAGNOSIS — M25469 Effusion, unspecified knee: Secondary | ICD-10-CM

## 2011-12-11 DIAGNOSIS — M1711 Unilateral primary osteoarthritis, right knee: Secondary | ICD-10-CM | POA: Diagnosis present

## 2011-12-11 DIAGNOSIS — F0390 Unspecified dementia without behavioral disturbance: Secondary | ICD-10-CM | POA: Diagnosis present

## 2011-12-11 DIAGNOSIS — R509 Fever, unspecified: Secondary | ICD-10-CM | POA: Diagnosis present

## 2011-12-11 DIAGNOSIS — N390 Urinary tract infection, site not specified: Secondary | ICD-10-CM | POA: Diagnosis present

## 2011-12-11 DIAGNOSIS — M069 Rheumatoid arthritis, unspecified: Secondary | ICD-10-CM | POA: Diagnosis present

## 2011-12-11 LAB — COMPREHENSIVE METABOLIC PANEL
ALT: 10 U/L (ref 0–35)
AST: 23 U/L (ref 0–37)
CO2: 20 mEq/L (ref 19–32)
Calcium: 8.9 mg/dL (ref 8.4–10.5)
Chloride: 102 mEq/L (ref 96–112)
Creatinine, Ser: 1.21 mg/dL — ABNORMAL HIGH (ref 0.50–1.10)
GFR calc Af Amer: 52 mL/min — ABNORMAL LOW (ref >90)
GFR calc non Af Amer: 45 mL/min — ABNORMAL LOW (ref >90)
Glucose, Bld: 277 mg/dL — ABNORMAL HIGH (ref 70–99)
Sodium: 134 mEq/L — ABNORMAL LOW (ref 135–145)
Total Bilirubin: 0.4 mg/dL (ref 0.3–1.2)

## 2011-12-11 LAB — CBC
MCH: 26.5 pg (ref 26.0–34.0)
MCHC: 33.8 g/dL (ref 30.0–36.0)
Platelets: 318 10*3/uL (ref 150–400)
RDW: 14.2 % (ref 11.5–15.5)

## 2011-12-11 LAB — GLUCOSE, CAPILLARY
Glucose-Capillary: 209 mg/dL — ABNORMAL HIGH (ref 70–99)
Glucose-Capillary: 161 mg/dL — ABNORMAL HIGH (ref 70–99)
Glucose-Capillary: 117 mg/dL — ABNORMAL HIGH (ref 70–99)

## 2011-12-11 LAB — PHOSPHORUS: Phosphorus: 2.5 mg/dL (ref 2.3–4.6)

## 2011-12-11 LAB — APTT: aPTT: 34 seconds (ref 24–37)

## 2011-12-11 LAB — MRSA PCR SCREENING: MRSA by PCR: NEGATIVE

## 2011-12-11 LAB — HEMOGLOBIN A1C: Hgb A1c MFr Bld: 9.4 % — ABNORMAL HIGH (ref <5.7)

## 2011-12-11 LAB — TSH: TSH: 0.963 u[IU]/mL (ref 0.350–4.500)

## 2011-12-11 MED ORDER — ZOLPIDEM TARTRATE 5 MG PO TABS
5.0000 mg | ORAL_TABLET | Freq: Every evening | ORAL | Status: DC | PRN
  Administered 2011-12-11: 5 mg via ORAL
  Filled 2011-12-11: qty 1

## 2011-12-11 MED ORDER — HYDROCODONE-ACETAMINOPHEN 5-325 MG PO TABS: 1.0000 | ORAL_TABLET | ORAL | Status: DC | PRN

## 2011-12-11 MED ORDER — PNEUMOCOCCAL VAC POLYVALENT 25 MCG/0.5ML IJ INJ
0.5000 mL | INJECTION | INTRAMUSCULAR | Status: AC
  Administered 2011-12-12: 0.5 mL via INTRAMUSCULAR
  Filled 2011-12-11: qty 0.5

## 2011-12-11 MED ORDER — INSULIN ASPART 100 UNIT/ML ~~LOC~~ SOLN
0.0000 [IU] | Freq: Three times a day (TID) | SUBCUTANEOUS | Status: DC
  Administered 2011-12-11: 3 [IU] via SUBCUTANEOUS
  Administered 2011-12-11: 2 [IU] via SUBCUTANEOUS
  Administered 2011-12-11: 5 [IU] via SUBCUTANEOUS
  Filled 2011-12-11: qty 3

## 2011-12-11 MED ORDER — MORPHINE SULFATE 2 MG/ML IJ SOLN
2.0000 mg | INTRAMUSCULAR | Status: DC | PRN
  Administered 2011-12-11: 2 mg via INTRAVENOUS
  Filled 2011-12-11: qty 1

## 2011-12-11 MED ORDER — ONDANSETRON HCL 4 MG/2ML IJ SOLN: 4.0000 mg | Freq: Four times a day (QID) | INTRAMUSCULAR | Status: DC | PRN

## 2011-12-11 MED ORDER — VANCOMYCIN HCL IN DEXTROSE 1-5 GM/200ML-% IV SOLN
INTRAVENOUS | Status: AC
  Filled 2011-12-11: qty 200

## 2011-12-11 MED ORDER — ASPIRIN EC 81 MG PO TBEC
81.0000 mg | DELAYED_RELEASE_TABLET | Freq: Every day | ORAL | Status: DC
  Administered 2011-12-11 – 2011-12-12 (×2): 81 mg via ORAL
  Filled 2011-12-11 (×2): qty 1

## 2011-12-11 MED ORDER — ACETAMINOPHEN 650 MG RE SUPP: 650.0000 mg | Freq: Four times a day (QID) | RECTAL | Status: DC | PRN

## 2011-12-11 MED ORDER — SODIUM CHLORIDE 0.9 % IV SOLN
INTRAVENOUS | Status: DC
  Administered 2011-12-11: 75 mL via INTRAVENOUS
  Administered 2011-12-11: 03:00:00 via INTRAVENOUS

## 2011-12-11 MED ORDER — VANCOMYCIN HCL 1000 MG IV SOLR
750.0000 mg | INTRAVENOUS | Status: DC
  Administered 2011-12-11: 750 mg via INTRAVENOUS
  Filled 2011-12-11 (×2): qty 750

## 2011-12-11 MED ORDER — ADULT MULTIVITAMIN W/MINERALS CH
1.0000 | ORAL_TABLET | Freq: Every day | ORAL | Status: DC
  Administered 2011-12-11 – 2011-12-12 (×2): 1 via ORAL
  Filled 2011-12-11 (×2): qty 1

## 2011-12-11 MED ORDER — SENNA 8.6 MG PO TABS
1.0000 | ORAL_TABLET | Freq: Two times a day (BID) | ORAL | Status: DC
  Administered 2011-12-11 – 2011-12-12 (×3): 8.6 mg via ORAL
  Filled 2011-12-11 (×3): qty 1

## 2011-12-11 MED ORDER — VANCOMYCIN HCL IN DEXTROSE 1-5 GM/200ML-% IV SOLN
1000.0000 mg | Freq: Once | INTRAVENOUS | Status: AC
  Administered 2011-12-11: 1000 mg via INTRAVENOUS
  Filled 2011-12-11: qty 200

## 2011-12-11 MED ORDER — LEVOFLOXACIN IN D5W 500 MG/100ML IV SOLN
500.0000 mg | Freq: Once | INTRAVENOUS | Status: AC
  Administered 2011-12-11: 500 mg via INTRAVENOUS
  Filled 2011-12-11: qty 100

## 2011-12-11 MED ORDER — DOCUSATE SODIUM 100 MG PO CAPS
100.0000 mg | ORAL_CAPSULE | Freq: Two times a day (BID) | ORAL | Status: DC
  Administered 2011-12-11 – 2011-12-12 (×3): 100 mg via ORAL
  Filled 2011-12-11 (×3): qty 1

## 2011-12-11 MED ORDER — ACETAMINOPHEN 325 MG PO TABS: 650.0000 mg | ORAL_TABLET | Freq: Four times a day (QID) | ORAL | Status: DC | PRN

## 2011-12-11 MED ORDER — INSULIN GLARGINE 100 UNIT/ML ~~LOC~~ SOLN
4.0000 [IU] | Freq: Every day | SUBCUTANEOUS | Status: DC
  Administered 2011-12-11: 4 [IU] via SUBCUTANEOUS
  Filled 2011-12-11: qty 3

## 2011-12-11 MED ORDER — METOPROLOL TARTRATE 25 MG PO TABS
12.5000 mg | ORAL_TABLET | Freq: Two times a day (BID) | ORAL | Status: DC
  Administered 2011-12-11 – 2011-12-12 (×4): 12.5 mg via ORAL
  Filled 2011-12-11 (×4): qty 1

## 2011-12-11 MED ORDER — ALUM & MAG HYDROXIDE-SIMETH 200-200-20 MG/5ML PO SUSP: 30.0000 mL | Freq: Four times a day (QID) | ORAL | Status: DC | PRN

## 2011-12-11 MED ORDER — LEVOFLOXACIN IN D5W 500 MG/100ML IV SOLN
INTRAVENOUS | Status: AC
  Filled 2011-12-11: qty 100

## 2011-12-11 MED ORDER — PANTOPRAZOLE SODIUM 40 MG PO TBEC
40.0000 mg | DELAYED_RELEASE_TABLET | Freq: Every day | ORAL | Status: DC
  Administered 2011-12-11: 40 mg via ORAL
  Filled 2011-12-11: qty 1

## 2011-12-11 MED ORDER — LEVOFLOXACIN IN D5W 250 MG/50ML IV SOLN
250.0000 mg | INTRAVENOUS | Status: DC
  Filled 2011-12-11: qty 50

## 2011-12-11 MED ORDER — ONDANSETRON HCL 4 MG PO TABS: 4.0000 mg | ORAL_TABLET | Freq: Four times a day (QID) | ORAL | Status: DC | PRN

## 2011-12-11 NOTE — Progress Notes (Signed)
ANTIBIOTIC CONSULT NOTE  Pharmacy Consult for Vancomycin and Levaquin Indication:   No Known Allergies  Patient Measurements: Height: 5\' 5"  (165.1 cm) Weight: 125 lb 10.6 oz (57 kg) IBW/kg (Calculated) : 57  Adjusted Body Weight:   Vital Signs: Temp: 99.2 F (37.3 C) (03/06 0400) Temp src: Oral (03/06 0400) BP: 151/79 mmHg (03/06 0400) Pulse Rate: 108  (03/06 0400) Intake/Output from previous day: 03/05 0701 - 03/06 0700 In: 496.3 [P.O.:240; I.V.:6.3] Out: -  Intake/Output from this shift:    Labs:  Basename 12/11/11 0325 12/10/11 2205  WBC 20.1* 17.9*  HGB 10.0* 11.0*  PLT 318 332  LABCREA -- --  CREATININE 1.21* 1.30*   Estimated Creatinine Clearance: 40 ml/min (by C-G formula based on Cr of 1.21).    Microbiology: Recent Results (from the past 720 hour(s))  MRSA PCR SCREENING     Status: Normal   Collection Time   12/11/11  1:15 AM      Component Value Range Status Comment   MRSA by PCR NEGATIVE  NEGATIVE  Final   CULTURE, BLOOD (ROUTINE X 2)     Status: Normal (Preliminary result)   Collection Time   12/11/11  2:49 AM      Component Value Range Status Comment   Specimen Description BLOOD LEFT ARM   Final    Special Requests BOTTLES DRAWN AEROBIC AND ANAEROBIC [facility] EACH   Final    Culture PENDING   Incomplete    Report Status PENDING   Incomplete   CULTURE, BLOOD (ROUTINE X 2)     Status: Normal (Preliminary result)   Collection Time   12/11/11  3:25 AM      Component Value Range Status Comment   Specimen Description BLOOD RIGHT ANTECUBITAL   Final    Special Requests BOTTLES DRAWN AEROBIC ONLY 2CC   Final    Culture PENDING   Incomplete    Report Status PENDING   Incomplete     Medical History: Past Medical History  Diagnosis Date  . Diabetes mellitus   . Hypertension   . Arthritis     Medications:  Scheduled:    . acetaminophen  650 mg Oral Once  . aspirin EC  81 mg Oral Daily  . cefTRIAXone (ROCEPHIN) 1 GM IVPB  1 g Intravenous Once  .  cefTRIAXone (ROCEPHIN) IVPB 1 gram/50 mL D5W      . docusate sodium  100 mg Oral BID  . insulin aspart  0-9 Units Subcutaneous TID WC  . insulin glargine  4 Units Subcutaneous Q breakfast  . levofloxacin (LEVAQUIN) IV  250 mg Intravenous Q24H  . levofloxacin (LEVAQUIN) IV  500 mg Intravenous Once  . metoprolol tartrate  12.5 mg Oral BID  . mulitivitamin with minerals  1 tablet Oral Daily  . pantoprazole  40 mg Oral Q1200  . pneumococcal 23 valent vaccine  0.5 mL Intramuscular Tomorrow-1000  . senna  1 tablet Oral BID  . sodium chloride  1,000 mL Intravenous Once  . vancomycin  750 mg Intravenous Q24H  . vancomycin  1,000 mg Intravenous Once  . DISCONTD: sodium chloride   Intravenous STAT   Assessment: Empiric therapy for UTI  Goal of Therapy:  Vancomycin trough level 10-15 mcg/ml  Plan:  Vancomycin 750 mg IV every 24 hours. Levaquin 500 mg IV x 1 then 250 mg IV every 24 hours. Vancomycin level at steady state.  Caryl Asp 12/11/2011,8:03 AM

## 2011-12-11 NOTE — Consult Note (Signed)
Reason for Consult:knee pain swelling  Referring Physician:  Dr  Antony Haste Caitlin Riddle is an 71 y.o. female.  HPI: as noted in hpi   H/o uti sepsis work up for sepsis ? 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 Diabetes mellitus   . Hypertension   . Arthritis     History reviewed. No pertinent past surgical history.  History reviewed. No pertinent family history.  Social History:  reports that she has never smoked. She does not have any smokeless tobacco history on file. She reports that she does not drink alcohol or use illicit drugs.  Allergies: No Known Allergies  Medications: I have reviewed the patient's current medications.  Results for orders placed during the hospital encounter of 12/10/11 (from the past 48 hour(s))  CBC     Status: Abnormal   Collection Time   12/10/11 10:05 PM      Component Value Range Comment   WBC 17.9 (*) 4.0 - 10.5 (K/uL)    RBC 4.13  3.87 - 5.11 (MIL/uL)    Hemoglobin 11.0 (*) 12.0 - 15.0 (g/dL)    HCT 40.9 (*) 81.1 - 46.0 (%)    MCV 78.9  78.0 - 100.0 (fL)    MCH 26.6  26.0 - 34.0 (pg)    MCHC 33.7  30.0 - 36.0 (g/dL)    RDW 91.4  78.2 - 95.6 (%)    Platelets 332  150 - 400 (K/uL)   DIFFERENTIAL     Status: Abnormal   Collection Time   12/10/11 10:05 PM      Component Value Range Comment   Neutrophils Relative 86 (*) 43 - 77 (%)    Neutro Abs 15.4 (*) 1.7 - 7.7 (K/uL)    Lymphocytes Relative 8 (*) 12 - 46 (%)    Lymphs Abs 1.4  0.7 - 4.0 (K/uL)    Monocytes Relative 6  3 - 12 (%)    Monocytes Absolute 1.0  0.1 - 1.0 (K/uL)    Eosinophils Relative 0  0 - 5 (%)    Eosinophils Absolute 0.0  0.0 - 0.7 (K/uL)    Basophils Relative 0  0 - 1 (%)    Basophils Absolute 0.0  0.0 - 0.1 (K/uL)    WBC Morphology TOXIC GRANULATION     COMPREHENSIVE METABOLIC PANEL     Status: Abnormal   Collection Time   12/10/11 10:05 PM   Component Value Range Comment   Sodium 134 (*) 135 - 145 (mEq/L)    Potassium 3.8  3.5 - 5.1 (mEq/L)    Chloride 101  96 - 112 (mEq/L)    CO2 21  19 - 32 (mEq/L)    Glucose, Bld 290 (*) 70 - 99 (mg/dL)    BUN 13  6 - 23 (mg/dL)    Creatinine, Ser 2.13 (*) 0.50 - 1.10 (mg/dL)    Calcium 9.8  8.4 - 10.5 (mg/dL)    Total Protein 7.8  6.0 - 8.3 (g/dL)    Albumin 3.2 (*) 3.5 - 5.2 (g/dL)    AST 18  0 - 37 (U/L)    ALT 9  0 - 35 (U/L)    Alkaline Phosphatase 119 (*) 39 - 117 (U/L)    Total Bilirubin 0.3  0.3 - 1.2 (mg/dL)    GFR calc non Af Amer 41 (*) >  90 (mL/min)    GFR calc Af Amer 48 (*) >90 (mL/min)   URINALYSIS, ROUTINE W REFLEX MICROSCOPIC     Status: Abnormal   Collection Time   12/10/11 10:42 PM      Component Value Range Comment   Color, Urine YELLOW  YELLOW     APPearance HAZY (*) CLEAR     Specific Gravity, Urine 1.010  1.005 - 1.030     pH 6.0  5.0 - 8.0     Glucose, UA >1000 (*) NEGATIVE (mg/dL)    Hgb urine dipstick TRACE (*) NEGATIVE     Bilirubin Urine NEGATIVE  NEGATIVE     Ketones, ur NEGATIVE  NEGATIVE (mg/dL)    Protein, ur 213 (*) NEGATIVE (mg/dL)    Urobilinogen, UA 0.2  0.0 - 1.0 (mg/dL)    Nitrite POSITIVE (*) NEGATIVE     Leukocytes, UA TRACE (*) NEGATIVE    URINE MICROSCOPIC-ADD ON     Status: Abnormal   Collection Time   12/10/11 10:42 PM      Component Value Range Comment   WBC, UA TOO NUMEROUS TO COUNT  <3 (WBC/hpf)    Bacteria, UA MANY (*) RARE    MRSA PCR SCREENING     Status: Normal   Collection Time   12/11/11  1:15 AM      Component Value Range Comment   MRSA by PCR NEGATIVE  NEGATIVE    CULTURE, BLOOD (ROUTINE X 2)     Status: Normal (Preliminary result)   Collection Time   12/11/11  2:49 AM      Component Value Range Comment   Specimen Description BLOOD LEFT ARM      Special Requests BOTTLES DRAWN AEROBIC AND ANAEROBIC 2CC EACH      Culture NO GROWTH <24 HRS      Report Status PENDING     MAGNESIUM     Status: Normal   Collection Time    12/11/11  3:00 AM      Component Value Range Comment   Magnesium 1.6  1.5 - 2.5 (mg/dL)   PHOSPHORUS     Status: Normal   Collection Time   12/11/11  3:00 AM      Component Value Range Comment   Phosphorus 2.5  2.3 - 4.6 (mg/dL)   TSH     Status: Normal   Collection Time   12/11/11  3:00 AM      Component Value Range Comment   TSH 0.963  0.350 - 4.500 (uIU/mL)   PROTIME-INR     Status: Normal   Collection Time   12/11/11  3:00 AM      Component Value Range Comment   Prothrombin Time 15.1  11.6 - 15.2 (seconds)    INR 1.17  0.00 - 1.49    APTT     Status: Normal   Collection Time   12/11/11  3:00 AM      Component Value Range Comment   aPTT 34  24 - 37 (seconds)   HEMOGLOBIN A1C     Status: Abnormal   Collection Time   12/11/11  3:00 AM      Component Value Range Comment   Hemoglobin A1C 9.4 (*) <5.7 (%)    Mean Plasma Glucose 223 (*) <117 (mg/dL)   COMPREHENSIVE METABOLIC PANEL     Status: Abnormal   Collection Time   12/11/11  3:25 AM      Component Value Range Comment   Sodium 134 (*) 135 -  145 (mEq/L)    Potassium 4.0  3.5 - 5.1 (mEq/L)    Chloride 102  96 - 112 (mEq/L)    CO2 20  19 - 32 (mEq/L)    Glucose, Bld 277 (*) 70 - 99 (mg/dL)    BUN 11  6 - 23 (mg/dL)    Creatinine, Ser 1.61 (*) 0.50 - 1.10 (mg/dL)    Calcium 8.9  8.4 - 10.5 (mg/dL)    Total Protein 7.1  6.0 - 8.3 (g/dL)    Albumin 2.9 (*) 3.5 - 5.2 (g/dL)    AST 23  0 - 37 (U/L)    ALT 10  0 - 35 (U/L)    Alkaline Phosphatase 108  39 - 117 (U/L)    Total Bilirubin 0.4  0.3 - 1.2 (mg/dL)    GFR calc non Af Amer 45 (*) >90 (mL/min)    GFR calc Af Amer 52 (*) >90 (mL/min)   CBC     Status: Abnormal   Collection Time   12/11/11  3:25 AM      Component Value Range Comment   WBC 20.1 (*) 4.0 - 10.5 (K/uL)    RBC 3.77 (*) 3.87 - 5.11 (MIL/uL)    Hemoglobin 10.0 (*) 12.0 - 15.0 (g/dL)    HCT 09.6 (*) 04.5 - 46.0 (%)    MCV 78.5  78.0 - 100.0 (fL)    MCH 26.5  26.0 - 34.0 (pg)    MCHC 33.8  30.0 - 36.0 (g/dL)    RDW  40.9  81.1 - 91.4 (%)    Platelets 318  150 - 400 (K/uL)   CULTURE, BLOOD (ROUTINE X 2)     Status: Normal (Preliminary result)   Collection Time   12/11/11  3:25 AM      Component Value Range Comment   Specimen Description BLOOD RIGHT ANTECUBITAL      Special Requests BOTTLES DRAWN AEROBIC ONLY 2CC      Culture NO GROWTH <24 HRS      Report Status PENDING     GLUCOSE, CAPILLARY     Status: Abnormal   Collection Time   12/11/11  7:17 AM      Component Value Range Comment   Glucose-Capillary 277 (*) 70 - 99 (mg/dL)    Comment 1 Notify RN      Comment 2 Documented in Chart     GLUCOSE, CAPILLARY     Status: Abnormal   Collection Time   12/11/11 11:45 AM      Component Value Range Comment   Glucose-Capillary 209 (*) 70 - 99 (mg/dL)    Comment 1 Notify RN      Comment 2 Documented in Chart       Mr Brain Wo Contrast  12/10/2011  *RADIOLOGY REPORT*  Clinical Data:   Seizure.  MRI HEAD WITHOUT CONTRAST  Technique:  Multiplanar, multiecho pulse sequences of the brain and surrounding structures were obtained according to standard protocol without intravenous contrast.  Comparison: Head CT 01/24/2011  Findings: The study is degraded by motion.  Diffusion imaging does not show any acute or subacute infarction.  The brainstem and cerebellum appear unremarkable.  The cerebral hemispheres show generalized atrophy but there is no evidence of focal infarction, mass lesion, hemorrhage, hydrocephalus or extra-axial collection. The calvarium is unremarkable.  There are some mucosal inflammatory changes of the sphenoid sinus.  IMPRESSION: Motion degraded exam.  No acute or focal finding.  Generalized brain atrophy.  Original Report Authenticated By: Janeece Riggers  [facility], M.D.   Dg Chest Portable 1 View  12/10/2011  *RADIOLOGY REPORT*  Clinical Data: Fever, fatigue, history hypertension, diabetes  PORTABLE CHEST - 1 VIEW  Comparison: Portable exam 2325 hours compared to 11/12/2011  Findings: Upper-normal size of cardiac  silhouette. Mediastinal contours and pulmonary vascularity normal. Lungs clear. No pleural effusion or pneumothorax. Bones demineralized. Left glenohumeral degenerative changes.  IMPRESSION: No acute abnormalities.  Original Report Authenticated By: Lollie Marrow, M.D.    Review of Systems  Constitutional: Positive for fever, chills and malaise/fatigue.       These have resolved   HENT: Negative.   Eyes: Negative.   Respiratory: Negative.   Cardiovascular: Negative.   Gastrointestinal: Negative.   Genitourinary: Positive for dysuria.  Musculoskeletal: Positive for myalgias and joint pain.  Skin: Negative.   Neurological: Negative.   Psychiatric/Behavioral: Negative.    Blood pressure 130/66, pulse 93, temperature 98.4 F (36.9 C), temperature source Oral, resp. rate 20, height 5\' 5"  (1.651 m), weight 57 kg (125 lb 10.6 oz), SpO2 96.00%. Physical Exam  Constitutional: She is oriented to person, place, and time. She appears well-developed and well-nourished.  Musculoskeletal:       Vital signs are stable as recorded  General appearance is normal  The patient is alert and oriented x3  The patient's mood and affect are normal  Gait assessment: lying in bed  The cardiovascular exam reveals normal pulses and temperature without edema swelling.  The lymphatic system is negative for palpable lymph nodes  The sensory exam is normal.  There are no pathologic reflexes.  Balance is normal.   Exam of the right knee  Inspection knee effusion 1+, no tenderness  Range of motion 5-115, no pain  Stability normal  Strength normal  Skin normal   Left knee .  rom normal  Strength normal Stability normal  No tenderness    Neurological: She is alert and oriented to person, place, and time. She has normal reflexes.    Assessment/Plan: Knee effusion   Not septic knee   Signing off   Xray if further work up required   [employer] 12/11/2011, 5:35 PM

## 2011-12-11 NOTE — Plan of Care (Signed)
Called Dr. Mort Sawyers office and spoke with Steward Drone to notify Dr. Romeo Apple of request for consult for questionable right knee sepsis.

## 2011-12-11 NOTE — Progress Notes (Signed)
CARE MANAGEMENT NOTE 12/11/2011  Patient:  Caitlin Riddle, Caitlin Riddle   Account Number:  [id number]  Date Initiated:  12/11/2011  Documentation initiated by:  Rosemary Holms  Subjective/Objective Assessment:   Pt admitted with UTI, swollen knee. PTA pt, spouse and son live together.     Action/Plan:   Spoke to spouse at bedside. Husband asked if the staff knew pt had Alzheimers. He states that he is ok w/ PO meds for BS and checking her BS but did not know anything past that.   Anticipated DC Date:  12/16/2011   Anticipated DC Plan:  HOME W HOME HEALTH SERVICES      DC Planning Services  CM consult      Choice offered to / List presented to:          [facility] arranged  HH-1 RN  HH-10 DISEASE MANAGEMENT      HH agency  [facility]   Status of service:  In process, will continue to follow Medicare Important Message given?   (If response is "NO", the following Medicare IM given date fields will be blank) Date Medicare IM given:   Date Additional Medicare IM given:    Discharge Disposition:    Per UR Regulation:    Comments:  12/11/11 [address] Leanord Hawking RN BSN Husband picked [facility] for [facility] RN needs. If she is DC'd home, would like home PT as well. He stated that he is concerned that because of her knee she is not getting up and will get weak.

## 2011-12-11 NOTE — Progress Notes (Signed)
Patient admitted earlier this morning for fever and possible seizure activity.  Patient seen and examined, database reviewed Husband at bedside, able to give more history  Work up has revealed a urinary tract infection, will follow up culture, she is on antibiotics.  There was concern for possible septic arthritis.  She has a right knee joint effusion that is warm, not particularly tender, no erythema.  She does have a history of rheumatoid arthritis. Her husband reports she has had joint effusions on and off for many years now.  I am doubtful that the source of her infection is a septic joint.  Orthopedic consultation has been ordered to see if patient requires arthrocentesis.  If joint infection felt to be less likely, will de escalate antibiotics.  She is on chronic medications that will need to be verified by pharmacy

## 2011-12-11 NOTE — H&P (Signed)
PCP:   Lilyan Punt, MD, MD   Chief Complaint:  "knee pain"  HPI: 71 year old female with dementia, admitted with initial suspicion of a seizure. Apparently she was shaking, which could be due to the high grade fever. Patient denies any chills, she seems not to know much, and does not know for how long the knees have been hurting.She was given ceftriaxone and referred for admission.  Review of Systems:  The patient denies anorexia,  weight loss,, vision loss, decreased hearing, hoarseness, chest pain, syncope, dyspnea on exertion, peripheral edema, balance deficits, hemoptysis, abdominal pain, melena, hematochezia, severe indigestion/heartburn, hematuria, incontinence, genital sores, muscle weakness, suspicious skin lesions, transient blindness, difficulty walking, depression, unusual weight change, abnormal bleeding, enlarged lymph nodes, angioedema, and breast masses.  Past Medical History: Past Medical History  Diagnosis Date  . Diabetes mellitus   . Hypertension   . Arthritis    History reviewed. No pertinent past surgical history.  Medications: Prior to Admission medications   Not on File    Allergies:  No Known Allergies  Social History:  does not have a smoking history on file. She does not have any smokeless tobacco history on file. She reports that she does not use illicit drugs. Her alcohol history not on file.   Family History: History reviewed. No pertinent family history.  Physical Exam: Filed Vitals:   12/10/11 2039 12/10/11 2222 12/10/11 2241 12/11/11 0120  BP: 158/82  160/69 148/72  Pulse: 122  114 102  Temp: 103.1 F (39.5 C) 100 F (37.8 C)  99.5 F (37.5 C)  TempSrc: Oral Oral Oral Oral  Resp: 20  20 20   Height:    5\' 5"  (1.651 m)  Weight:    56.5 kg (124 lb 9 oz)  SpO2: 98%  98% 98%   Sitting up in bed. Not in distress. RS- Bilateral air entry. Basilar rhonchi, more on the left side. No jvd or carotid bruits. S1S2.Regular tachycardia. No murmurs.  Abdomen- unremarkable. CNS- pleasantly confused. Moving all extremities. Extremities- swollen right knee, somewhat tender to palpation. No pedal edema.   Labs on Admission:   [facility] 12/10/11 2205  NA 134*  K 3.8  CL 101  CO2 21  GLUCOSE 290*  BUN 13  CREATININE 1.30*  CALCIUM 9.8  MG --  PHOS --    Basename 12/10/11 2205  AST 18  ALT 9  ALKPHOS 119*  BILITOT 0.3  PROT 7.8  ALBUMIN 3.2*   No results found for this basename: LIPASE:2,AMYLASE:2 in the last 72 hours  Basename 12/10/11 2205  WBC 17.9*  NEUTROABS 15.4*  HGB 11.0*  HCT 32.6*  MCV 78.9  PLT 332   No results found for this basename: CKTOTAL:3,CKMB:3,CKMBINDEX:3,TROPONINI:3 in the last 72 hours No results found for this basename: TSH,T4TOTAL,FREET3,T3FREE,THYROIDAB in the last 72 hours No results found for this basename: VITAMINB12:2,FOLATE:2,FERRITIN:2,TIBC:2,IRON:2,RETICCTPCT:2 in the last 72 hours  Radiological Exams on Admission: Dg Chest 2 View  11/12/2011  *RADIOLOGY REPORT*  Clinical Data: Cough and congestion for 4 weeks, history diabetes, question pneumonia  CHEST - 2 VIEW  Comparison: 07/05/2010  Findings: Upper-normal size of cardiac silhouette. Mediastinal contours and pulmonary vascularity normal. Lungs clear. No pleural effusion or pneumothorax. Bones demineralized.  IMPRESSION: No acute abnormalities.  Original Report Authenticated By: Lollie Marrow, M.D.   Mr Brain Wo Contrast  12/10/2011  *RADIOLOGY REPORT*  Clinical Data:   Seizure.  MRI HEAD WITHOUT CONTRAST  Technique:  Multiplanar, multiecho pulse sequences of the brain and surrounding  structures were obtained according to standard protocol without intravenous contrast.  Comparison: Head CT 01/24/2011  Findings: The study is degraded by motion.  Diffusion imaging does not show any acute or subacute infarction.  The brainstem and cerebellum appear unremarkable.  The cerebral hemispheres show generalized atrophy but there is no evidence of focal  infarction, mass lesion, hemorrhage, hydrocephalus or extra-axial collection. The calvarium is unremarkable.  There are some mucosal inflammatory changes of the sphenoid sinus.  IMPRESSION: Motion degraded exam.  No acute or focal finding.  Generalized brain atrophy.  Original Report Authenticated By: Thomasenia Sales, M.D.   Dg Chest Portable 1 View  12/10/2011  *RADIOLOGY REPORT*  Clinical Data: Fever, fatigue, history hypertension, diabetes  PORTABLE CHEST - 1 VIEW  Comparison: Portable exam 2325 hours compared to 11/12/2011  Findings: Upper-normal size of cardiac silhouette. Mediastinal contours and pulmonary vascularity normal. Lungs clear. No pleural effusion or pneumothorax. Bones demineralized. Left glenohumeral degenerative changes.  IMPRESSION: No acute abnormalities.  Original Report Authenticated By: Lollie Marrow, M.D.    Assessment Elderly lady with dementia, hence unreliable historian, came in with shaking, initially thought to be seizure activity. However, she was found tp have fever/leukocytosis/uti. She also has swollen right knee. The differential is sepsis of urinary source versus septic arthritis. She is also coughing but cxr unremarkable so far. Plan .UTI (lower urinary tract infection)- will pursue septic work up. Vanc/zosyn, as high wbc. Blood culturesx2 sets. .Arthritis of knee, right- consult ortho for arthrocentesis if they confer. Pain control. .Dementia- stable. .Fever- as above .Leukocytosis-as above. .DM2 uncontrolled- ?home meds. Lantus/aspart for now. Check hba1c .htn- uncontrolled. Add beta blocker. Will ask pharmacy to confirm home meds.   Condition guarded. Camila Maita [id number].. 12/11/2011, 2:43 AM

## 2011-12-12 LAB — BASIC METABOLIC PANEL
Chloride: 109 mEq/L (ref 96–112)
GFR calc Af Amer: 57 mL/min — ABNORMAL LOW (ref >90)
GFR calc non Af Amer: 49 mL/min — ABNORMAL LOW (ref >90)
Potassium: 3.7 mEq/L (ref 3.5–5.1)
Sodium: 139 mEq/L (ref 135–145)

## 2011-12-12 LAB — CBC
Platelets: 306 10*3/uL (ref 150–400)
RDW: 14 % (ref 11.5–15.5)
WBC: 13.8 10*3/uL — ABNORMAL HIGH (ref 4.0–10.5)

## 2011-12-12 MED ORDER — LEVOFLOXACIN 750 MG PO TABS: 750.0000 mg | ORAL_TABLET | Freq: Every day | ORAL | Status: AC

## 2011-12-12 MED ORDER — METOPROLOL TARTRATE 12.5 MG HALF TABLET: 25.0000 mg | ORAL_TABLET | Freq: Two times a day (BID) | ORAL | Status: DC

## 2011-12-12 MED ORDER — METFORMIN HCL 500 MG PO TABS: 500.0000 mg | ORAL_TABLET | Freq: Two times a day (BID) | ORAL | Status: DC

## 2011-12-12 NOTE — Discharge Summary (Signed)
Physician Discharge Summary  Patient ID: Caitlin Riddle MRN: [id number] DOB/AGE: [birth year] 69 y.o.  Admit date: 12/10/2011 Discharge date: 12/12/2011  Primary Care Physician:  Lilyan Punt, MD, MD   Discharge Diagnoses:    Active Problems:  UTI (lower urinary tract infection)  Arthritis of knee, right  Dementia  Fever  Leukocytosis  Rheumatoid arthritis Metabolic encephalopathy due to underlying infection, improved Dehydration, improved with IVF HTN Diabetes  Medication List  As of 12/12/2011  9:48 AM   TAKE these medications         donepezil 10 MG tablet   Commonly known as: ARICEPT   Take 10 mg by mouth at bedtime.      HYDROcodone-acetaminophen 7.5-325 MG per tablet   Commonly known as: NORCO   Take 1 tablet by mouth 4 (four) times daily.      levofloxacin 750 MG tablet   Commonly known as: LEVAQUIN   Take 1 tablet (750 mg total) by mouth daily.      metFORMIN 500 MG tablet   Commonly known as: GLUCOPHAGE   Take 1 tablet (500 mg total) by mouth 2 (two) times daily with a meal.      metoprolol tartrate 12.5 mg Tabs   Commonly known as: LOPRESSOR   Take 1 tablet (25 mg total) by mouth 2 (two) times daily.           Discharge Exam: Blood pressure 158/72, pulse 92, temperature 98.4 F (36.9 C), temperature source Oral, resp. rate 18, height 5\' 5"  (1.651 m), weight 59.4 kg (130 lb 15.3 oz), SpO2 98.00%. Exam unchanged from 12/11/11  Disposition and Follow-up:  Follow up with primary doctor in 1-2 weeks  Consults:  Orthopedics, Dr. Romeo Apple   Significant Diagnostic Studies:  Mr Brain Wo Contrast  12/10/2011  *RADIOLOGY REPORT*  Clinical Data:   Seizure.  MRI HEAD WITHOUT CONTRAST  Technique:  Multiplanar, multiecho pulse sequences of the brain and surrounding structures were obtained according to standard protocol without intravenous contrast.  Comparison: Head CT 01/24/2011  Findings: The study is degraded by motion.  Diffusion imaging does not show any acute  or subacute infarction.  The brainstem and cerebellum appear unremarkable.  The cerebral hemispheres show generalized atrophy but there is no evidence of focal infarction, mass lesion, hemorrhage, hydrocephalus or extra-axial collection. The calvarium is unremarkable.  There are some mucosal inflammatory changes of the sphenoid sinus.  IMPRESSION: Motion degraded exam.  No acute or focal finding.  Generalized brain atrophy.  Original Report Authenticated By: Thomasenia Sales, M.D.   Dg Chest Portable 1 View  12/10/2011  *RADIOLOGY REPORT*  Clinical Data: Fever, fatigue, history hypertension, diabetes  PORTABLE CHEST - 1 VIEW  Comparison: Portable exam 2325 hours compared to 11/12/2011  Findings: Upper-normal size of cardiac silhouette. Mediastinal contours and pulmonary vascularity normal. Lungs clear. No pleural effusion or pneumothorax. Bones demineralized. Left glenohumeral degenerative changes.  IMPRESSION: No acute abnormalities.  Original Report Authenticated By: Lollie Marrow, M.D.    Brief H and P: For complete details please refer to admission H and P, but in brief 71 year old female with dementia, admitted with initial suspicion of a seizure. Apparently she was shaking, which could be due to the high grade fever. Patient denies any chills, she seems not to know much, and does not know for how long the knees have been hurting.She was given ceftriaxone and referred for admission.   Hospital Course:  Active Problems:  UTI (lower urinary tract infection)  Arthritis  of knee, right  Dementia  Fever  Leukocytosis  Rheumatoid arthritis  This is a 71 year old female who was brought into the emergency room with fever, change in mental status. She was admitted to the hospital for further evaluation. Patient was found to be dehydrated and have a urinary tract infection. Urine culture final results are currently pending. She was placed on broad spectrum antibiotics and has significantly improved. She  has remained afebrile. Her mental status is currently returned to baseline. There was concern that patient may have a septic joint. She does have a right knee effusion. She was evaluated by Dr. Romeo Apple from orthopedics and did not feel that this was an infected joint. I agree with his assessment as this effusion has been chronic in nature, was nontender, no erythema. She will be transitioned to oral Levaquin for empiric coverage and she may follow up with her primary care doctor for further management. She was also started on metoprolol for hypertension. Her blood sugars were mildly elevated. We have started metformin and this can be further followed up by her primary care doctor. Patient will be discharged home today in the care of her husband.   Time spent on Discharge:  Signed: Lillie Bollig [facility] Pager: [phone] 12/12/2011, 9:48 AM

## 2011-12-12 NOTE — Progress Notes (Signed)
Pt to be discharged home per MD order. All discharge instructions and prescriptions given and gone over with patient and family members. All questions and concerns answered. Pt discharged home via wheelchair.

## 2011-12-12 NOTE — Progress Notes (Signed)
CARE MANAGEMENT NOTE 12/12/2011  Patient:  Caitlin Riddle, Caitlin Riddle   Account Number:  [id number]  Date Initiated:  12/11/2011  Documentation initiated by:  Caitlin Riddle  Subjective/Objective Assessment:   Pt admitted with UTI, swollen knee. PTA pt, spouse and son live together.     Action/Plan:   Spoke to spouse at bedside. Husband asked if the staff knew pt had Alzheimers. He states that he is ok w/ PO meds for BS and checking her BS but did not know anything past that.   Anticipated DC Date:  12/16/2011   Anticipated DC Plan:  HOME W HOME HEALTH SERVICES      DC Planning Services  CM consult      Choice offered to / List presented to:          [facility] arranged  HH-1 RN  HH-10 DISEASE MANAGEMENT      HH agency  [facility]   Status of service:  Completed, signed off Medicare Important Message given?   (If response is "NO", the following Medicare IM given date fields will be blank) Date Medicare IM given:   Date Additional Medicare IM given:    Discharge Disposition:  HOME W HOME HEALTH SERVICES  Per UR Regulation:    Comments:  12/12/11 1000 Terisa Belardo Leanord Hawking RN BSN CM DCing with [facility] in place   12/11/11 1600 Jacquelinne Speak Leanord Hawking RN BSN Husband picked [facility] for [facility] RN needs. If she is DC'd home, would like home PT as well. He stated that he is concerned that because of her knee she is not getting up and will get weak.

## 2011-12-12 NOTE — Progress Notes (Signed)
Utilization review completed.  

## 2011-12-14 LAB — URINE CULTURE
Colony Count: >=100000
Culture  Setup Time: 201303060110

## 2011-12-16 LAB — CULTURE, BLOOD (ROUTINE X 2)

## 2012-12-24 ENCOUNTER — Telehealth: Payer: Self-pay | Admitting: Family Medicine

## 2012-12-24 NOTE — Telephone Encounter (Signed)
Written.

## 2012-12-24 NOTE — Telephone Encounter (Signed)
Patient notified that the rx is ready for pickup.

## 2012-12-24 NOTE — Telephone Encounter (Signed)
Pt. Needs a refill of her Oxycodone 5-325 mg

## 2012-12-25 ENCOUNTER — Telehealth: Payer: Self-pay | Admitting: Family Medicine

## 2013-01-07 NOTE — Telephone Encounter (Signed)
Closed encounter - erroneous error

## 2013-01-25 ENCOUNTER — Telehealth: Payer: Self-pay | Admitting: Family Medicine

## 2013-01-25 MED ORDER — OXYCODONE-ACETAMINOPHEN 5-325 MG PO TABS: 1.0000 | ORAL_TABLET | Freq: Four times a day (QID) | ORAL | Status: DC | PRN

## 2013-01-25 NOTE — Telephone Encounter (Signed)
Rx upfront for pickup. Patient notified and also advised to schedule mandatory OV asap. Patient verbalized understanding.

## 2013-01-25 NOTE — Telephone Encounter (Signed)
Needs refill on Oxycodone

## 2013-01-25 NOTE — Telephone Encounter (Signed)
#  1-schedule office visit mandatory. #2-may have refill of oxycodone 5 mg/325 mg #120. 1   4 times a day when necessary area

## 2013-02-23 ENCOUNTER — Telehealth: Payer: Self-pay | Admitting: Family Medicine

## 2013-02-23 NOTE — Telephone Encounter (Signed)
Message to pt of rx to be picked up at office

## 2013-02-23 NOTE — Telephone Encounter (Signed)
Ok may refill but needs ov

## 2013-02-23 NOTE — Telephone Encounter (Signed)
Patient needs a refill of oxycodone.

## 2013-03-26 ENCOUNTER — Telehealth: Payer: Self-pay | Admitting: Family Medicine

## 2013-03-26 NOTE — Telephone Encounter (Signed)
Percocet #60, needs OV before more

## 2013-03-26 NOTE — Telephone Encounter (Signed)
Last OV 10/26/12, Last Percocet RX written on 02/23/13 (takes 5/325 #120 1 po QID)

## 2013-03-26 NOTE — Telephone Encounter (Signed)
Pt needs refill on percocet 5/325

## 2013-03-26 NOTE — Telephone Encounter (Signed)
RX ready for pickup. Front desk notified patient only wrote for 60 because needs to schedule OV.

## 2013-04-12 ENCOUNTER — Ambulatory Visit (INDEPENDENT_AMBULATORY_CARE_PROVIDER_SITE_OTHER): Payer: Medicare Other | Admitting: Family Medicine

## 2013-04-12 ENCOUNTER — Encounter: Payer: Self-pay | Admitting: Family Medicine

## 2013-04-12 VITALS — BP 160/70 | HR 60 | Wt 109.8 lb

## 2013-04-12 DIAGNOSIS — E119 Type 2 diabetes mellitus without complications: Secondary | ICD-10-CM

## 2013-04-12 DIAGNOSIS — F0390 Unspecified dementia without behavioral disturbance: Secondary | ICD-10-CM

## 2013-04-12 DIAGNOSIS — Z0000 Encounter for general adult medical examination without abnormal findings: Secondary | ICD-10-CM

## 2013-04-12 DIAGNOSIS — M069 Rheumatoid arthritis, unspecified: Secondary | ICD-10-CM

## 2013-04-12 MED ORDER — OXYCODONE-ACETAMINOPHEN 5-325 MG PO TABS: 1.0000 | ORAL_TABLET | Freq: Four times a day (QID) | ORAL | Status: DC | PRN

## 2013-04-12 NOTE — Progress Notes (Signed)
  Subjective:    Patient ID: [name], female    DOB: 1941/07/25, 72 y.o.   MRN: [id number]  HPIHere for a check up. A1C 8.1. Concerns bilateral knee swelling. Patient has history of rheumatoid arthritis. Has a lot of pain discomfort takes Percocet for this up to 4 times a day does not cause diarrhea constipation or drowsiness does not abuse it her husband helps her with this Patient also has history of Alzheimer's difficulty staying focused. Gets easily confused. Husband helps her. Past medical history benign   Review of Systems see above.     Objective:   Physical Exam Lungs are clear hearts regular pulse normal extremities no edema severe osteo-arthritis and rheumatoid arthritis noted no significant swelling in the knees.       Assessment & Plan:  1- subpar DM control- improve diet/ avoid sugary drinks 2- pain meds for rheum given 3- alz/dementia stable 4 recheck 3 months mamo ordered

## 2013-04-12 NOTE — Progress Notes (Deleted)
  Subjective:    Patient ID: Caitlin Riddle, female    DOB: [birth year], 72 y.o.   MRN: [id number]  Diabetes  Bilateral knee pain and swelling    Review of Systems     Objective:   Physical Exam        Assessment & Plan:

## 2013-04-12 NOTE — Patient Instructions (Signed)
Diabetes Meal Planning Guide The diabetes meal planning guide is a tool to help you plan your meals and snacks. It is important for people with diabetes to manage their blood glucose (sugar) levels. Choosing the right foods and the right amounts throughout your day will help control your blood glucose. Eating right can even help you improve your blood pressure and reach or maintain a healthy weight. CARBOHYDRATE COUNTING MADE EASY When you eat carbohydrates, they turn to sugar. This raises your blood glucose level. Counting carbohydrates can help you control this level so you feel better. When you plan your meals by counting carbohydrates, you can have more flexibility in what you eat and balance your medicine with your food intake. Carbohydrate counting simply means adding up the total amount of carbohydrate grams in your meals and snacks. Try to eat about the same amount at each meal. Foods with carbohydrates are listed below. Each portion below is 1 carbohydrate serving or 15 grams of carbohydrates. Ask your dietician how many grams of carbohydrates you should eat at each meal or snack. Grains and Starches  1 slice bread.   English muffin or hotdog/hamburger bun.   cup cold cereal (unsweetened).   cup cooked pasta or rice.   cup starchy vegetables (corn, potatoes, peas, beans, winter squash).  1 tortilla (6 inches).   bagel.  1 waffle or pancake (size of a CD).   cup cooked cereal.  4 to 6 small crackers. *Whole grain is recommended. Fruit  1 cup fresh unsweetened berries, melon, papaya, pineapple.  1 small fresh fruit.   banana or mango.   cup fruit juice (4 oz unsweetened).   cup canned fruit in natural juice or water.  2 tbs dried fruit.  12 to 15 grapes or cherries. Milk and Yogurt  1 cup fat-free or 1% milk.  1 cup soy milk.  6 oz light yogurt with sugar-free sweetener.  6 oz low-fat soy yogurt.  6 oz plain yogurt. Vegetables  1 cup raw or  cup  cooked is counted as 0 carbohydrates or a "free" food.  If you eat 3 or more servings at 1 meal, count them as 1 carbohydrate serving. Other Carbohydrates   oz chips or pretzels.   cup ice cream or frozen yogurt.   cup sherbet or sorbet.  2 inch square cake, no frosting.  1 tbs honey, sugar, jam, jelly, or syrup.  2 small cookies.  3 squares of graham crackers.  3 cups popcorn.  6 crackers.  1 cup broth-based soup.  Count 1 cup casserole or other mixed foods as 2 carbohydrate servings.  Foods with less than 20 calories in a serving may be counted as 0 carbohydrates or a "free" food. You may want to purchase a book or computer software that lists the carbohydrate gram counts of different foods. In addition, the nutrition facts panel on the labels of the foods you eat are a good source of this information. The label will tell you how big the serving size is and the total number of carbohydrate grams you will be eating per serving. Divide this number by 15 to obtain the number of carbohydrate servings in a portion. Remember, 1 carbohydrate serving equals 15 grams of carbohydrate. SERVING SIZES Measuring foods and serving sizes helps you make sure you are getting the right amount of food. The list below tells how big or small some common serving sizes are.  1 oz.........4 stacked dice.  3 oz.........Deck of cards.  1 tsp........Tip   of little finger.  1 tbs........Thumb.  2 tbs........Golf ball.   cup.......Half of a fist.  1 cup........A fist. SAMPLE DIABETES MEAL PLAN Below is a sample meal plan that includes foods from the grain and starches, dairy, vegetable, fruit, and meat groups. A dietician can individualize a meal plan to fit your calorie needs and tell you the number of servings needed from each food group. However, controlling the total amount of carbohydrates in your meal or snack is more important than making sure you include all of the food groups at every  meal. You may interchange carbohydrate containing foods (dairy, starches, and fruits). The meal plan below is an example of a 2000 calorie diet using carbohydrate counting. This meal plan has 17 carbohydrate servings. Breakfast  1 cup oatmeal (2 carb servings).   cup light yogurt (1 carb serving).  1 cup blueberries (1 carb serving).   cup almonds. Snack  1 large apple (2 carb servings).  1 low-fat string cheese stick. Lunch  Chicken breast salad.  1 cup spinach.   cup chopped tomatoes.  2 oz chicken breast, sliced.  2 tbs low-fat Italian dressing.  12 whole-wheat crackers (2 carb servings).  12 to 15 grapes (1 carb serving).  1 cup low-fat milk (1 carb serving). Snack  1 cup carrots.   cup hummus (1 carb serving). Dinner  3 oz broiled salmon.  1 cup brown rice (3 carb servings). Snack  1  cups steamed broccoli (1 carb serving) drizzled with 1 tsp olive oil and lemon juice.  1 cup light pudding (2 carb servings). DIABETES MEAL PLANNING WORKSHEET Your dietician can use this worksheet to help you decide how many servings of foods and what types of foods are right for you.  BREAKFAST Food Group and Servings / Carb Servings Grain/Starches __________________________________ Dairy __________________________________________ Vegetable ______________________________________ Fruit ___________________________________________ Meat __________________________________________ Fat ____________________________________________ LUNCH Food Group and Servings / Carb Servings Grain/Starches ___________________________________ Dairy ___________________________________________ Fruit ____________________________________________ Meat ___________________________________________ Fat _____________________________________________ DINNER Food Group and Servings / Carb Servings Grain/Starches ___________________________________ Dairy  ___________________________________________ Fruit ____________________________________________ Meat ___________________________________________ Fat _____________________________________________ SNACKS Food Group and Servings / Carb Servings Grain/Starches ___________________________________ Dairy ___________________________________________ Vegetable _______________________________________ Fruit ____________________________________________ Meat ___________________________________________ Fat _____________________________________________ DAILY TOTALS Starches _________________________ Vegetable ________________________ Fruit ____________________________ Dairy ____________________________ Meat ____________________________ Fat ______________________________ Document Released: 06/20/2005 Document Revised: 12/16/2011 Document Reviewed: 05/01/2009 ExitCare Patient Information 2014 [employer]. DASH Diet The DASH diet stands for "Dietary Approaches to Stop Hypertension." It is a healthy eating plan that has been shown to reduce high blood pressure (hypertension) in as little as 14 days, while also possibly providing other significant health benefits. These other health benefits include reducing the risk of breast cancer after menopause and reducing the risk of type 2 diabetes, heart disease, colon cancer, and stroke. Health benefits also include weight loss and slowing kidney failure in patients with chronic kidney disease.  DIET GUIDELINES  Limit salt (sodium). Your diet should contain less than 1500 mg of sodium daily.  Limit refined or processed carbohydrates. Your diet should include mostly whole grains. Desserts and added sugars should be used sparingly.  Include small amounts of heart-healthy fats. These types of fats include nuts, oils, and tub margarine. Limit saturated and trans fats. These fats have been shown to be harmful in the body. CHOOSING FOODS  The following food groups  are based on a 2000 calorie diet. See your Registered Dietitian for individual calorie needs. Grains and Grain Products (6 to 8 servings daily)  Eat More Often:   Whole-wheat bread, brown rice, whole-grain or wheat pasta, quinoa, popcorn without added fat or salt (air popped).  Eat Less Often: White bread, white pasta, white rice, cornbread. Vegetables (4 to 5 servings daily)  Eat More Often: Fresh, frozen, and canned vegetables. Vegetables may be raw, steamed, roasted, or grilled with a minimal amount of fat.  Eat Less Often/Avoid: Creamed or fried vegetables. Vegetables in a cheese sauce. Fruit (4 to 5 servings daily)  Eat More Often: All fresh, canned (in natural juice), or frozen fruits. Dried fruits without added sugar. One hundred percent fruit juice ( cup [237 mL] daily).  Eat Less Often: Dried fruits with added sugar. Canned fruit in light or heavy syrup. Lean Meats, Fish, and Poultry (2 servings or less daily. One serving is 3 to 4 oz [85-114 g]).  Eat More Often: Ninety percent or leaner ground beef, tenderloin, sirloin. Round cuts of beef, chicken breast, turkey breast. All fish. Grill, bake, or broil your meat. Nothing should be fried.  Eat Less Often/Avoid: Fatty cuts of meat, turkey, or chicken leg, thigh, or wing. Fried cuts of meat or fish. Dairy (2 to 3 servings)  Eat More Often: Low-fat or fat-free milk, low-fat plain or light yogurt, reduced-fat or part-skim cheese.  Eat Less Often/Avoid: Milk (whole, 2%).Whole milk yogurt. Full-fat cheeses. Nuts, Seeds, and Legumes (4 to 5 servings per week)  Eat More Often: All without added salt.  Eat Less Often/Avoid: Salted nuts and seeds, canned beans with added salt. Fats and Sweets (limited)  Eat More Often: Vegetable oils, tub margarines without trans fats, sugar-free gelatin. Mayonnaise and salad dressings.  Eat Less Often/Avoid: Coconut oils, palm oils, butter, stick margarine, cream, half and half, cookies, candy,  pie. FOR MORE INFORMATION The Dash Diet Eating Plan: www.dashdiet.org Document Released: 09/12/2011 Document Revised: 12/16/2011 Document Reviewed: 09/12/2011 ExitCare Patient Information 2014 [employer].  

## 2013-04-13 ENCOUNTER — Encounter: Payer: Self-pay | Admitting: *Deleted

## 2013-04-16 ENCOUNTER — Encounter: Payer: Self-pay | Admitting: *Deleted

## 2013-04-16 ENCOUNTER — Ambulatory Visit (HOSPITAL_COMMUNITY): Payer: Medicare Other

## 2013-05-12 ENCOUNTER — Telehealth: Payer: Self-pay | Admitting: Family Medicine

## 2013-05-12 MED ORDER — OXYCODONE-ACETAMINOPHEN 5-325 MG PO TABS: 1.0000 | ORAL_TABLET | Freq: Four times a day (QID) | ORAL | Status: DC | PRN

## 2013-05-12 NOTE — Telephone Encounter (Signed)
Patient's husband states Caitlin Riddle lost her prescription for oxyCODONE-acetaminophen (PERCOCET/ROXICET) 5-325 MG per tablet.  Needs another prescription.  Please call Patient. Thanks

## 2013-05-12 NOTE — Telephone Encounter (Signed)
May refill medications Percocet 5 mg/325 mg, #120, one 4 times a day when necessary

## 2013-05-12 NOTE — Telephone Encounter (Signed)
Last filled and picked up percocet 5/325 #120 on 04/12/13

## 2013-05-12 NOTE — Telephone Encounter (Signed)
Rx up front for pick up 

## 2013-05-21 ENCOUNTER — Other Ambulatory Visit: Payer: Self-pay | Admitting: *Deleted

## 2013-05-21 MED ORDER — OXYCODONE-ACETAMINOPHEN 5-325 MG PO TABS: 1.0000 | ORAL_TABLET | Freq: Four times a day (QID) | ORAL | Status: DC | PRN

## 2013-05-26 ENCOUNTER — Other Ambulatory Visit: Payer: Self-pay | Admitting: Family Medicine

## 2013-06-17 ENCOUNTER — Other Ambulatory Visit: Payer: Self-pay | Admitting: Family Medicine

## 2013-06-17 NOTE — Telephone Encounter (Signed)
Ok plus 2 ref 

## 2013-06-25 ENCOUNTER — Other Ambulatory Visit: Payer: Self-pay | Admitting: Family Medicine

## 2013-07-14 ENCOUNTER — Encounter: Payer: Self-pay | Admitting: Family Medicine

## 2013-07-14 ENCOUNTER — Ambulatory Visit (INDEPENDENT_AMBULATORY_CARE_PROVIDER_SITE_OTHER): Payer: Medicare Other | Admitting: Family Medicine

## 2013-07-14 VITALS — BP 138/78 | Ht 65.0 in | Wt 104.0 lb

## 2013-07-14 DIAGNOSIS — Z79899 Other long term (current) drug therapy: Secondary | ICD-10-CM

## 2013-07-14 DIAGNOSIS — E119 Type 2 diabetes mellitus without complications: Secondary | ICD-10-CM

## 2013-07-14 DIAGNOSIS — E782 Mixed hyperlipidemia: Secondary | ICD-10-CM

## 2013-07-14 DIAGNOSIS — F0390 Unspecified dementia without behavioral disturbance: Secondary | ICD-10-CM

## 2013-07-14 DIAGNOSIS — M069 Rheumatoid arthritis, unspecified: Secondary | ICD-10-CM

## 2013-07-14 DIAGNOSIS — M25569 Pain in unspecified knee: Secondary | ICD-10-CM

## 2013-07-14 DIAGNOSIS — D509 Iron deficiency anemia, unspecified: Secondary | ICD-10-CM

## 2013-07-14 DIAGNOSIS — M25561 Pain in right knee: Secondary | ICD-10-CM

## 2013-07-14 DIAGNOSIS — R634 Abnormal weight loss: Secondary | ICD-10-CM

## 2013-07-14 MED ORDER — MEMANTINE HCL 10 MG PO TABS: 10.0000 mg | ORAL_TABLET | Freq: Two times a day (BID) | ORAL | Status: DC

## 2013-07-14 NOTE — Progress Notes (Signed)
  Subjective:    Patient ID: Caitlin Riddle, female    DOB: June 16, 1941, 72 y.o.   MRN: [id number]  HPI Patient arrives for a diabetic check up. Patient is here today with family members a sister and brother. They state that they're really not aware of her day-to-day condition but they do check in on her. They states that she seems to have right knee pain when she gets up and moves around they also state that her appetite doesn't seem to be as good as what they think it should be. Significant amount of time was spent discussing with the family this patient's past medical history.  Review of Systems Patient is unable to give a review of systems because of her dementia yeah but she denies being having any headaches chest pain shortness of breath denies rectal bleeding    Objective:   Physical Exam  Lungs are clear hearts regular severe rheumatoid arthritis as noted specifically in the right knee the wrists elbows diabetic foot exam normal patient unable to do neurosensory      Assessment & Plan:  #1 diabetes good control #2 monitor weight loss. Await findings of lab work I believe this is all due to dementia #3 dementia restart Namenda 10 mg twice a day. I offered to the brother and sister of the patient that they could come in to discuss end-of-life issues with dementia. Otherwise see patient back in 3 months

## 2013-07-16 LAB — CBC WITH DIFFERENTIAL/PLATELET
Basophils Absolute: 0 10*3/uL (ref 0.0–0.1)
Basophils Relative: 0 % (ref 0–1)
HCT: 32.8 % — ABNORMAL LOW (ref 36.0–46.0)
Lymphocytes Relative: 32 % (ref 12–46)
Lymphs Abs: 3.2 10*3/uL (ref 0.7–4.0)
MCV: 76.5 fL — ABNORMAL LOW (ref 78.0–100.0)
Neutro Abs: 5.7 10*3/uL (ref 1.7–7.7)
Platelets: 336 10*3/uL (ref 150–400)
RBC: 4.29 MIL/uL (ref 3.87–5.11)
RDW: 16.7 % — ABNORMAL HIGH (ref 11.5–15.5)
WBC: 10.1 10*3/uL (ref 4.0–10.5)

## 2013-07-16 LAB — HEPATIC FUNCTION PANEL
ALT: <8 U/L (ref 0–35)
AST: 16 U/L (ref 0–37)
Albumin: 3.7 g/dL (ref 3.5–5.2)
Alkaline Phosphatase: 103 U/L (ref 39–117)
Indirect Bilirubin: 0.2 mg/dL (ref 0.0–0.9)
Total Bilirubin: 0.3 mg/dL (ref 0.3–1.2)
Total Protein: 7.2 g/dL (ref 6.0–8.3)

## 2013-07-16 LAB — BASIC METABOLIC PANEL
Calcium: 9.3 mg/dL (ref 8.4–10.5)
Chloride: 107 mEq/L (ref 96–112)
Creat: 1.3 mg/dL — ABNORMAL HIGH (ref 0.50–1.10)
Sodium: 140 mEq/L (ref 135–145)

## 2013-07-16 LAB — LIPID PANEL
HDL: 44 mg/dL (ref >39)
Total CHOL/HDL Ratio: 4.6 Ratio
Triglycerides: 113 mg/dL (ref <150)

## 2013-07-20 ENCOUNTER — Other Ambulatory Visit: Payer: Self-pay

## 2013-07-20 MED ORDER — PRAVASTATIN SODIUM 20 MG PO TABS: 20.0000 mg | ORAL_TABLET | Freq: Every day | ORAL | Status: DC

## 2013-08-06 ENCOUNTER — Other Ambulatory Visit: Payer: Self-pay | Admitting: Family Medicine

## 2013-08-06 NOTE — Telephone Encounter (Signed)
Refill times 4 

## 2013-08-10 ENCOUNTER — Encounter: Payer: Self-pay | Admitting: Family Medicine

## 2013-08-10 ENCOUNTER — Ambulatory Visit (INDEPENDENT_AMBULATORY_CARE_PROVIDER_SITE_OTHER): Payer: Medicare Other | Admitting: Family Medicine

## 2013-08-10 VITALS — Ht 65.0 in | Wt 104.0 lb

## 2013-08-10 DIAGNOSIS — F0280 Dementia in other diseases classified elsewhere without behavioral disturbance: Secondary | ICD-10-CM

## 2013-08-10 DIAGNOSIS — M1711 Unilateral primary osteoarthritis, right knee: Secondary | ICD-10-CM

## 2013-08-10 DIAGNOSIS — M25569 Pain in unspecified knee: Secondary | ICD-10-CM

## 2013-08-10 DIAGNOSIS — M25561 Pain in right knee: Secondary | ICD-10-CM

## 2013-08-10 DIAGNOSIS — M1710 Unilateral primary osteoarthritis, unspecified knee: Secondary | ICD-10-CM

## 2013-08-10 NOTE — Progress Notes (Signed)
  Subjective:    Patient ID: Caitlin Riddle, female    DOB: 15-Feb-1941, 72 y.o.   MRN: [id number]  HPI 40 minutes was spent with the family discussing her dementia as well as her care. The patient is able to verbalize a little bit but cannot carry a conversation. In addition to this the patient is unable to do much in the way of walking and less assistance with getting up she is able to feed herself but does not eat much in addition to this she also has lack of bowel and bladder control. She is low weight she also has rheumatoid arthritis. Family had numerous questions regarding dementia as well as treatment and prognosis. Over half the time was spent discussing all of these aspects with them.  Past medical history regarding dementia yeah rheumatoid arthritis family history noncontributory patient does not smoke  She was under the care of her husband but it was very suspect that she was ever getting medicines he was not taking good care of her and could have been diverting her pain medicine. Review of Systems No history of headache chest pain shortness of breath nausea vomiting diarrhea.    Objective:   Physical Exam  Patient with severe osteoarthritis in her knees but she also has severe rheumatoid arthritis in her hands elbows and knees. Patient also affected significantly by dementia yeah she is pleasant she answers questions but she has not capable of representing herself or making decisions.      Assessment & Plan:  Dementia-40 minutes spent with family discussing these different aspects and issues. Currently she is in assisted living and that seems to be the best place for her. She may end up needing to be in a nursing home at some point. In addition to this it is possible that the assisted-living might have to do more frequent bathing plus also prompting her for bathroom use.  Severe osteoarthritis with rheumatoid arthritis of the right knee I did discuss the possibility of seeing a  rheumatologist family is going to hold off on this currently she does not have transportation to [place]. Therefore we'll set up with orthopedics for an injection in the knee we'll set up with Dr. Romeo Apple.

## 2013-08-19 DIAGNOSIS — F0280 Dementia in other diseases classified elsewhere without behavioral disturbance: Secondary | ICD-10-CM | POA: Insufficient documentation

## 2013-08-25 ENCOUNTER — Ambulatory Visit: Payer: PRIVATE HEALTH INSURANCE | Admitting: Family Medicine

## 2013-09-08 ENCOUNTER — Ambulatory Visit (HOSPITAL_COMMUNITY)
Admission: RE | Admit: 2013-09-08 | Discharge: 2013-09-08 | Disposition: A | Discharge status: Home / Self Care | Payer: Medicare Other | Source: Ambulatory Visit | Attending: Orthopedic Surgery | Admitting: Orthopedic Surgery

## 2013-09-08 ENCOUNTER — Ambulatory Visit (INDEPENDENT_AMBULATORY_CARE_PROVIDER_SITE_OTHER): Payer: Medicare Other | Admitting: Orthopedic Surgery

## 2013-09-08 ENCOUNTER — Other Ambulatory Visit: Payer: Self-pay | Admitting: Orthopedic Surgery

## 2013-09-08 VITALS — BP 144/86 | Ht 65.0 in | Wt 104.0 lb

## 2013-09-08 DIAGNOSIS — M25561 Pain in right knee: Secondary | ICD-10-CM

## 2013-09-08 DIAGNOSIS — M25469 Effusion, unspecified knee: Secondary | ICD-10-CM | POA: Insufficient documentation

## 2013-09-08 DIAGNOSIS — M259 Joint disorder, unspecified: Secondary | ICD-10-CM | POA: Insufficient documentation

## 2013-09-08 DIAGNOSIS — M25569 Pain in unspecified knee: Secondary | ICD-10-CM | POA: Insufficient documentation

## 2013-09-08 DIAGNOSIS — M1711 Unilateral primary osteoarthritis, right knee: Secondary | ICD-10-CM

## 2013-09-08 DIAGNOSIS — M1710 Unilateral primary osteoarthritis, unspecified knee: Secondary | ICD-10-CM

## 2013-09-08 NOTE — Patient Instructions (Signed)
Reschedule after x-ray

## 2013-09-08 NOTE — Progress Notes (Signed)
Patient ID: Caitlin Riddle, female   DOB: 03/03/1941, 72 y.o.   MRN: [id number] The patient will be seen in another time. She presented with x-ray. Does not walk does not stand. Her x-ray will have to be done at the hospital. We advised this for some reason that facility did not get this completed and dropped her off at the office. We will see her after her x-ray has been done

## 2013-09-09 ENCOUNTER — Telehealth: Payer: Self-pay | Admitting: Family Medicine

## 2013-09-09 NOTE — Telephone Encounter (Signed)
Patients guardian(her sister) calls with the concern that she is not sure if she should tell [place] that her husband(Caitlin Riddle) passed on 2023-02-26 night or if she should take her to the funeral. She would like Dr Lorin Picket to call her back with advice on how she should handle this situation with [place].

## 2013-09-09 NOTE — Telephone Encounter (Signed)
This patient has severe dementia. She does not remember anything minutes a minute. I talked for length of time with the sister. It does not appear that it would be of any benefit for this patient to be told that her former husband has passed away. Even though she would probably forget that I think it would cause significant emotional strain even  if for the time being. So therefore this topic will not be covered, family agrees

## 2013-09-09 NOTE — Telephone Encounter (Signed)
She said she needs answered today due to him being buried tomorrow.

## 2013-09-16 ENCOUNTER — Telehealth: Payer: Self-pay | Admitting: Orthopedic Surgery

## 2013-09-16 ENCOUNTER — Telehealth: Payer: Self-pay | Admitting: Family Medicine

## 2013-09-16 ENCOUNTER — Ambulatory Visit (INDEPENDENT_AMBULATORY_CARE_PROVIDER_SITE_OTHER): Payer: Medicare Other | Admitting: Orthopedic Surgery

## 2013-09-16 VITALS — Ht 65.0 in | Wt 104.0 lb

## 2013-09-16 DIAGNOSIS — R29898 Other symptoms and signs involving the musculoskeletal system: Secondary | ICD-10-CM

## 2013-09-16 DIAGNOSIS — IMO0002 Reserved for concepts with insufficient information to code with codable children: Secondary | ICD-10-CM

## 2013-09-16 DIAGNOSIS — M1710 Unilateral primary osteoarthritis, unspecified knee: Secondary | ICD-10-CM

## 2013-09-16 DIAGNOSIS — M1711 Unilateral primary osteoarthritis, right knee: Secondary | ICD-10-CM | POA: Insufficient documentation

## 2013-09-16 NOTE — Telephone Encounter (Signed)
Well, Dr. Mort Sawyers office should go ahead and set this up. I believe he is seeing her. If he has seen her then certainly his office can set this up. If for some reason this cannot be done by Dr. Mort Sawyers office then we can do so but it seems to me that a lot of extra work that may not be necessary on our part

## 2013-09-16 NOTE — Telephone Encounter (Signed)
Received call from [facility] from Dr. Lorin Picket Luk[place]'s office, ph# [phone], relaying that facility, [facility], ph# [phone], called Dr. Gerda Diss to request physical therapy orders for [facility], per Dr. Romeo Apple seeing patient today, 09/16/13;  Dr. Gerda Diss said he is fine to have Dr. Romeo Apple handle the therapy order.  Please advise.

## 2013-09-16 NOTE — Telephone Encounter (Signed)
Notified Lupita Leash that Dr. Mort Sawyers office should go ahead and set this up. She verbalized understanding.

## 2013-09-16 NOTE — Telephone Encounter (Signed)
Returned call to [facility], and spoke with [place]. I explained to her that the order for PT was written on the order form they send and signed by Dr. Romeo Apple. I asked what else they needed, and she said she didn't know and she would have Lupita Leash call me tomorrow 09/17/13.

## 2013-09-16 NOTE — Progress Notes (Signed)
Patient ID: Caitlin Riddle, female   DOB: 06/29/1941, 72 y.o.   MRN: [id number]  Chief Complaint  Patient presents with  . Knee Pain    Right knee pain. Referred by Dr. Lilyan Punt    HISTORY: I saw this patient without the ability to x-ray her last time so in review cubic centimeter by Dr. Lilyan Punt This patient fell 3 years ago fractured her proximal tibia he was treated closed secondary to hypoglycemia. The last month and a half the patient reports that she can ambulate. She can stand and pivot but cannot ambulate as she did prior to that. She complains of 10 out of 10 pain with walking no pain at rest pain is constant she is currently a nursing facility  Her reported review of systems is negative except for the musculoskeletal system  She has no allergies  She lists medical problems of dementia and diabetes she did not list any surgeries  Family history of arthritis and cancer diabetes. Social history disabled and again dementia and in nursing facility  Ht 5\' 5"  (1.651 m)  Wt 104 lb (47.174 kg)  BMI 17.31 kg/m2 She sits comfortably in the wheelchair she does not make any abnormal sounds. She answers questions appropriately. She is oriented to person and place. Her mood seems pleasant. She does not ambulate. She has tenderness and decreased range of motion with weakness in the right knee inability to fully extend the knee. She is tender around the joint without swelling skin is intact the knee is stable. She has good distal pulse normal sensation no lymphadenopathy no pathologic reflexes we cannot assess her balance Upper extremity exam  The right and left upper extremity:   Inspection and palpation revealed no abnormalities in the upper extremities.   Range of motion is full without contracture.  Motor exam is normal with grade 5 strength.  The joints are fully reduced without subluxation.  There is no atrophy or tremor and muscle tone is normal.  All joints are  stable.    Her x-rays show a healed proximal tibia fracture with some mild malalignment but overall alignment is acceptable  She has osteoarthritis of the joint  Encounter Diagnoses  Name Primary?  . Osteoarthritis of right knee Yes  . Weakness of right leg     At this point her symptoms x-rays and clinical picture do not match. She's not a candidate for knee replacement surgery without further workup. She is to have a neurologist evaluate her overall condition as well as a preop medical consult in case she needs surgery. She also needs physical therapy to see why she's not walking assess her gait problem and tried to address it with exercise.

## 2013-09-16 NOTE — Telephone Encounter (Signed)
Caitlin Riddle @ [facility] called to say that Dr. Romeo Apple would like pt to have PT for gait assessment & gait training.  Please advise

## 2013-09-17 ENCOUNTER — Telehealth: Payer: Self-pay | Admitting: Family Medicine

## 2013-09-17 NOTE — Telephone Encounter (Signed)
PATIENT SISTER CALLED TO REMIND YOU TO SEND ORDER TO NURSING HOME FOR PAITENT TO HAVE BATH EVERYDAY AND PLEASE SEND HER A COPY ALSO AND SHE WANTS SOMETHING ON DEMENTIA MAILED TO HER.

## 2013-09-28 ENCOUNTER — Emergency Department (HOSPITAL_COMMUNITY): Payer: Medicare Other

## 2013-09-28 ENCOUNTER — Emergency Department (HOSPITAL_COMMUNITY)
Admission: EM | Admit: 2013-09-28 | Discharge: 2013-09-28 | Disposition: A | Discharge status: Home / Self Care | Payer: Medicare Other | Attending: Emergency Medicine | Admitting: Emergency Medicine

## 2013-09-28 DIAGNOSIS — R55 Syncope and collapse: Secondary | ICD-10-CM | POA: Diagnosis not present

## 2013-09-28 DIAGNOSIS — E785 Hyperlipidemia, unspecified: Secondary | ICD-10-CM | POA: Diagnosis not present

## 2013-09-28 DIAGNOSIS — R1110 Vomiting, unspecified: Secondary | ICD-10-CM | POA: Diagnosis not present

## 2013-09-28 DIAGNOSIS — Z8669 Personal history of other diseases of the nervous system and sense organs: Secondary | ICD-10-CM | POA: Insufficient documentation

## 2013-09-28 DIAGNOSIS — R404 Transient alteration of awareness: Secondary | ICD-10-CM | POA: Diagnosis present

## 2013-09-28 DIAGNOSIS — E119 Type 2 diabetes mellitus without complications: Secondary | ICD-10-CM | POA: Diagnosis not present

## 2013-09-28 DIAGNOSIS — F0390 Unspecified dementia without behavioral disturbance: Secondary | ICD-10-CM | POA: Insufficient documentation

## 2013-09-28 DIAGNOSIS — Z87448 Personal history of other diseases of urinary system: Secondary | ICD-10-CM | POA: Insufficient documentation

## 2013-09-28 DIAGNOSIS — Z79899 Other long term (current) drug therapy: Secondary | ICD-10-CM | POA: Insufficient documentation

## 2013-09-28 DIAGNOSIS — I10 Essential (primary) hypertension: Secondary | ICD-10-CM | POA: Insufficient documentation

## 2013-09-28 DIAGNOSIS — Z8739 Personal history of other diseases of the musculoskeletal system and connective tissue: Secondary | ICD-10-CM | POA: Insufficient documentation

## 2013-09-28 LAB — CBC WITH DIFFERENTIAL/PLATELET
Basophils Relative: 0 % (ref 0–1)
Eosinophils Absolute: 0.3 10*3/uL (ref 0.0–0.7)
Eosinophils Relative: 3 % (ref 0–5)
MCH: 26.5 pg (ref 26.0–34.0)
MCHC: 33 g/dL (ref 30.0–36.0)
MCV: 80.2 fL (ref 78.0–100.0)
Monocytes Relative: 4 % (ref 3–12)
Neutrophils Relative %: 68 % (ref 43–77)
Platelets: 293 10*3/uL (ref 150–400)
RBC: 4.3 MIL/uL (ref 3.87–5.11)

## 2013-09-28 LAB — COMPREHENSIVE METABOLIC PANEL
ALT: 8 U/L (ref 0–35)
Albumin: 3.2 g/dL — ABNORMAL LOW (ref 3.5–5.2)
Alkaline Phosphatase: 144 U/L — ABNORMAL HIGH (ref 39–117)
BUN: 12 mg/dL (ref 6–23)
CO2: 24 mEq/L (ref 19–32)
Calcium: 9.3 mg/dL (ref 8.4–10.5)
Creatinine, Ser: 1.25 mg/dL — ABNORMAL HIGH (ref 0.50–1.10)
GFR calc Af Amer: 49 mL/min — ABNORMAL LOW (ref >90)
Potassium: 4.3 mEq/L (ref 3.5–5.1)
Sodium: 141 mEq/L (ref 135–145)
Total Protein: 7.8 g/dL (ref 6.0–8.3)

## 2013-09-28 LAB — URINALYSIS, ROUTINE W REFLEX MICROSCOPIC
Bilirubin Urine: NEGATIVE
Glucose, UA: NEGATIVE mg/dL
Ketones, ur: NEGATIVE mg/dL
Leukocytes, UA: NEGATIVE
Nitrite: NEGATIVE
Specific Gravity, Urine: 1.015 (ref 1.005–1.030)
Urobilinogen, UA: 0.2 mg/dL (ref 0.0–1.0)
pH: 6 (ref 5.0–8.0)

## 2013-09-28 LAB — URINE MICROSCOPIC-ADD ON

## 2013-09-28 LAB — GLUCOSE, CAPILLARY: Glucose-Capillary: 91 mg/dL (ref 70–99)

## 2013-09-28 LAB — TROPONIN I: Troponin I: <0.3 ng/mL (ref <0.30)

## 2013-09-28 MED ORDER — SODIUM CHLORIDE 0.9 % IV BOLUS (SEPSIS)
1000.0000 mL | Freq: Once | INTRAVENOUS | Status: AC
  Administered 2013-09-28: 1000 mL via INTRAVENOUS

## 2013-09-28 NOTE — ED Notes (Signed)
Pt continent of urine and bowel. Pt asked if needed to urinate. Pt denies having to void at this time. Will re-assess after returns from xray.

## 2013-09-28 NOTE — ED Notes (Signed)
MRI reported would get pt in about 20 minutes. Pt and EDP aware of delay.

## 2013-09-28 NOTE — ED Notes (Signed)
RN called [employer] for d/c transportation.

## 2013-09-28 NOTE — ED Provider Notes (Signed)
CSN: [id number]     Arrival date & time 09/28/13  0845 History   This chart was scribed for Caitlin Lennert, MD, by Yevette Edwards, ED Scribe. This patient was seen in room APA19/APA19 and the patient's care was started at 8:53 AM.  None    Chief Complaint  Patient presents with  . Loss of Consciousness    Patient is a 72 y.o. female presenting with syncope. The history is provided by the patient, medical records and the EMS personnel. No language interpreter was used.  Loss of Consciousness Episode history:  Single Most recent episode:  Today Duration:  2 minutes Timing:  Rare Progression:  Resolved Chronicity:  New Context: sitting down   Witnessed: yes   Worsened by:  Nothing tried Associated symptoms: dizziness and vomiting   Associated symptoms: no nausea and no weakness   Risk factors: no seizures     HPI Comments: Caitlin Riddle is a 72 y.o. female, with a h/o HTN and DM,  who was brought in via EMS  to the Emergency Department presenting with a witnessed syncopal episode which occurred at [employer] this morning and which lasted a few minutes. The episode occurred after the pt had taken medication at breakfast. She reports she experienced dizziness prior to the LOC.  Upon rousing, the pt experienced one episode of emesis. She denies any current pain, nausea, dizziness, or weakness. She also denies a h/o prior syncope. The pt is a non-smoker.   Past Medical History  Diagnosis Date  . Diabetes mellitus   . Hypertension   . Arthritis   . Hyperlipidemia   . Vision loss of left eye   . Arthralgia   . Renal insufficiency   . Dementia    Past Surgical History  Procedure Laterality Date  . Total abdominal hysterectomy    . Colonoscopy    . Esophagogastroduodenoscopy     No family history on file. History  Substance Use Topics  . Smoking status: Never Smoker   . Smokeless tobacco: Not on file  . Alcohol Use: No   No OB history provided.  Review of Systems   Cardiovascular: Positive for syncope.  Gastrointestinal: Positive for vomiting. Negative for nausea.  Neurological: Positive for dizziness. Negative for weakness.  All other systems reviewed and are negative.   Allergies  Review of patient's allergies indicates no known allergies.  Home Medications   Current Outpatient Rx  Name  Route  Sig  Dispense  Refill  . memantine (NAMENDA) 10 MG tablet   Oral   Take 1 tablet (10 mg total) by mouth 2 (two) times daily.   60 tablet   4   . metFORMIN (GLUCOPHAGE) 500 MG tablet      TAKE 1/2 TABLET (250mg ) BY MOUTH TWICE DAILY.   30 tablet   1   . pravastatin (PRAVACHOL) 20 MG tablet   Oral   Take 1 tablet (20 mg total) by mouth daily.   30 tablet   5   . traMADol (ULTRAM) 50 MG tablet      TAKE (1) TABLET BY MOUTH EVERY (6) HOURS AS NEEDED FOR PAIN.   30 tablet   4    Triage Vitals: BP 141/76  Pulse 96  Temp(Src) 98.5 F (36.9 C) (Oral)  Resp 13  Ht 5\' 5"  (1.651 m)  Wt 104 lb (47.174 kg)  BMI 17.31 kg/m2  SpO2 98%  Physical Exam  Nursing note and vitals reviewed. Constitutional: She is oriented to  person, place, and time.  HENT:  Head: Normocephalic.  Eyes: Conjunctivae and EOM are normal. No scleral icterus.  Neck: Neck supple. No thyromegaly present.  Cardiovascular: Normal rate and regular rhythm.  Exam reveals no gallop and no friction rub.   No murmur heard. Pulmonary/Chest: No stridor. She has no wheezes. She has no rales. She exhibits no tenderness.  Abdominal: She exhibits no distension. There is no tenderness. There is no rebound.  Musculoskeletal: Normal range of motion. She exhibits no edema.  Lymphadenopathy:    She has no cervical adenopathy.  Neurological: She is oriented to person, place, and time. She exhibits normal muscle tone. Coordination normal.  Skin: No rash noted. No erythema.  Psychiatric: She has a normal mood and affect. Her behavior is normal.    ED Course  Procedures (including  critical care time)  DIAGNOSTIC STUDIES: Oxygen Saturation is 98% on room air, normal by my interpretation.    COORDINATION OF CARE:  8:58 AM- Discussed treatment plan with patient, and the patient agreed to the plan.   Labs Review Labs Reviewed  CBC WITH DIFFERENTIAL - Abnormal; Notable for the following:    Hemoglobin 11.4 (*)    HCT 34.5 (*)    All other components within normal limits  COMPREHENSIVE METABOLIC PANEL - Abnormal; Notable for the following:    Glucose, Bld 114 (*)    Creatinine, Ser 1.25 (*)    Albumin 3.2 (*)    Alkaline Phosphatase 144 (*)    GFR calc non Af Amer 43 (*)    GFR calc Af Amer 49 (*)    All other components within normal limits  URINALYSIS, ROUTINE W REFLEX MICROSCOPIC - Abnormal; Notable for the following:    Protein, ur 100 (*)    All other components within normal limits  TROPONIN I  GLUCOSE, CAPILLARY  URINE MICROSCOPIC-ADD ON   Imaging Review Dg Chest 1 View  09/28/2013   CLINICAL DATA:  Loss of consciousness.  EXAM: CHEST - 1 VIEW  COMPARISON:  05/21/2013  FINDINGS: Heart, mediastinum and hila are within normal limits. Lungs are clear. No pleural effusion or pneumothorax.  Bony thorax is diffusely demineralized but intact.  IMPRESSION: No active disease.   Electronically Signed   By: Amie Portland M.D.   On: 09/28/2013 10:01   Ct Head Wo Contrast  09/28/2013   CLINICAL DATA:  Dizziness and syncope  EXAM: CT HEAD WITHOUT CONTRAST  TECHNIQUE: Contiguous axial images were obtained from the base of the skull through the vertex without intravenous contrast. Study was obtained within 24 hr of patient's arrival at the emergency department  COMPARISON:  Brain CT January 24, 2011 and brain MRI December 10, 2011  FINDINGS: There is moderate diffuse atrophy. There is no mass, hemorrhage, extra-axial fluid collection, or midline shift. There is a focal infarct in the inferolateral right putamen which may be recent. It was not present on prior studies. There is  a more subtle area of decreased attenuation in the right thalamus, felt to represent acute infarct. There is moderate small vessel disease throughout the centra semiovale bilaterally.  Bony calvarium appears intact. The mastoid air cells are clear. There is extensive sphenoid sinus disease, particularly on the right. There is an air-fluid level in the left sphenoid sinus. There is patchy ethmoid sinus disease bilaterally. There is a retention cyst in the visualize left maxillary antrum.  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 No hemorrhage. Atrophy with moderate periventricular  small vessel disease. Extensive sinusitis with air-fluid level in the left sphenoid sinus.   Electronically Signed   By: Bretta Bang M.D.   On: 09/28/2013 09:54    EKG Interpretation    Date/Time:  Tuesday September 28 2013 08:45:38 EST Ventricular Rate:  93 PR Interval:  124 QRS Duration: 76 QT Interval:  354 QTC Calculation: 440 R Axis:   37 Text Interpretation:  Normal sinus rhythm Normal ECG No previous ECGs available Confirmed by Kendryck Lacroix  MD, Malary Aylesworth ([id number]) on 09/28/2013 2:35:13 PM            MDM  Syncope,  Nl tests will have pt follow up with pcp.  Possible hypoglycemia    Caitlin Lennert, MD 09/28/13 1435

## 2013-09-28 NOTE — ED Notes (Signed)
Per EMS, pt had witnessed syncopal episode by [employer]. Pt was eating breakfast and took medicine and lost consciousness. When pt come to pt vomited x1. Per Facility staff upon EMS arrival pt at baseline now. Pt denies any pain,nausea,dizziness or weakness at this time. nad noted.

## 2013-10-01 NOTE — Telephone Encounter (Signed)
The letter was dictated. Please send it to the nursing home. Also send information that was printed off to the sister. This is coming to you on the front of the chart. Thank you.

## 2013-10-05 ENCOUNTER — Emergency Department (HOSPITAL_COMMUNITY): Payer: Medicare Other

## 2013-10-05 ENCOUNTER — Encounter (HOSPITAL_COMMUNITY): Payer: Self-pay | Admitting: Emergency Medicine

## 2013-10-05 ENCOUNTER — Emergency Department (HOSPITAL_COMMUNITY)
Admission: EM | Admit: 2013-10-05 | Discharge: 2013-10-05 | Disposition: A | Discharge status: Home / Self Care | Payer: Medicare Other | Attending: Emergency Medicine | Admitting: Emergency Medicine

## 2013-10-05 DIAGNOSIS — Z8669 Personal history of other diseases of the nervous system and sense organs: Secondary | ICD-10-CM | POA: Insufficient documentation

## 2013-10-05 DIAGNOSIS — M129 Arthropathy, unspecified: Secondary | ICD-10-CM | POA: Insufficient documentation

## 2013-10-05 DIAGNOSIS — E119 Type 2 diabetes mellitus without complications: Secondary | ICD-10-CM

## 2013-10-05 DIAGNOSIS — R748 Abnormal levels of other serum enzymes: Secondary | ICD-10-CM | POA: Insufficient documentation

## 2013-10-05 DIAGNOSIS — R509 Fever, unspecified: Secondary | ICD-10-CM

## 2013-10-05 DIAGNOSIS — R1110 Vomiting, unspecified: Secondary | ICD-10-CM | POA: Diagnosis not present

## 2013-10-05 DIAGNOSIS — M1710 Unilateral primary osteoarthritis, unspecified knee: Secondary | ICD-10-CM

## 2013-10-05 DIAGNOSIS — E785 Hyperlipidemia, unspecified: Secondary | ICD-10-CM | POA: Insufficient documentation

## 2013-10-05 DIAGNOSIS — R5381 Other malaise: Secondary | ICD-10-CM | POA: Diagnosis present

## 2013-10-05 DIAGNOSIS — I10 Essential (primary) hypertension: Secondary | ICD-10-CM | POA: Insufficient documentation

## 2013-10-05 DIAGNOSIS — Z79899 Other long term (current) drug therapy: Secondary | ICD-10-CM | POA: Insufficient documentation

## 2013-10-05 DIAGNOSIS — Z87448 Personal history of other diseases of urinary system: Secondary | ICD-10-CM | POA: Insufficient documentation

## 2013-10-05 DIAGNOSIS — F0390 Unspecified dementia without behavioral disturbance: Secondary | ICD-10-CM | POA: Insufficient documentation

## 2013-10-05 LAB — CBC WITH DIFFERENTIAL/PLATELET
Basophils Absolute: 0 10*3/uL (ref 0.0–0.1)
Basophils Relative: 0 % (ref 0–1)
Eosinophils Absolute: 0.2 10*3/uL (ref 0.0–0.7)
Eosinophils Relative: 1 % (ref 0–5)
HCT: 37.5 % (ref 36.0–46.0)
Hemoglobin: 12.5 g/dL (ref 12.0–15.0)
Lymphocytes Relative: 6 % — ABNORMAL LOW (ref 12–46)
MCH: 26.9 pg (ref 26.0–34.0)
MCHC: 33.3 g/dL (ref 30.0–36.0)
MCV: 80.8 fL (ref 78.0–100.0)
Monocytes Absolute: 0.4 10*3/uL (ref 0.1–1.0)
Monocytes Relative: 2 % — ABNORMAL LOW (ref 3–12)
Neutro Abs: 13.4 10*3/uL — ABNORMAL HIGH (ref 1.7–7.7)
Platelets: 294 10*3/uL (ref 150–400)
RDW: 14.9 % (ref 11.5–15.5)
WBC: 14.7 10*3/uL — ABNORMAL HIGH (ref 4.0–10.5)

## 2013-10-05 LAB — COMPREHENSIVE METABOLIC PANEL
ALT: 10 U/L (ref 0–35)
AST: 18 U/L (ref 0–37)
Albumin: 3 g/dL — ABNORMAL LOW (ref 3.5–5.2)
Alkaline Phosphatase: 135 U/L — ABNORMAL HIGH (ref 39–117)
BUN: 15 mg/dL (ref 6–23)
Calcium: 9.6 mg/dL (ref 8.4–10.5)
Creatinine, Ser: 1.25 mg/dL — ABNORMAL HIGH (ref 0.50–1.10)
GFR calc non Af Amer: 43 mL/min — ABNORMAL LOW (ref >90)
Glucose, Bld: 109 mg/dL — ABNORMAL HIGH (ref 70–99)
Potassium: 4.8 mEq/L (ref 3.7–5.3)
Sodium: 140 mEq/L (ref 137–147)
Total Bilirubin: 0.3 mg/dL (ref 0.3–1.2)
Total Protein: 8.1 g/dL (ref 6.0–8.3)

## 2013-10-05 LAB — URINALYSIS, ROUTINE W REFLEX MICROSCOPIC
Bilirubin Urine: NEGATIVE
Glucose, UA: NEGATIVE mg/dL
Protein, ur: 30 mg/dL — AB
Specific Gravity, Urine: 1.01 (ref 1.005–1.030)
Urobilinogen, UA: 0.2 mg/dL (ref 0.0–1.0)

## 2013-10-05 LAB — URINE MICROSCOPIC-ADD ON

## 2013-10-05 MED ORDER — PROMETHAZINE HCL 25 MG PO TABS: 25.0000 mg | ORAL_TABLET | Freq: Four times a day (QID) | ORAL | Status: DC | PRN

## 2013-10-05 MED ORDER — ONDANSETRON HCL 4 MG/2ML IJ SOLN
4.0000 mg | Freq: Once | INTRAMUSCULAR | Status: AC
  Administered 2013-10-05: 4 mg via INTRAVENOUS
  Filled 2013-10-05: qty 2

## 2013-10-05 MED ORDER — SODIUM CHLORIDE 0.9 % IV BOLUS (SEPSIS)
1000.0000 mL | Freq: Once | INTRAVENOUS | Status: AC
  Administered 2013-10-05: 1000 mL via INTRAVENOUS

## 2013-10-05 MED ORDER — HYDROCODONE-ACETAMINOPHEN 5-325 MG PO TABS: 1.0000 | ORAL_TABLET | ORAL | Status: DC | PRN

## 2013-10-05 MED ORDER — SODIUM CHLORIDE 0.9 % IV SOLN: Freq: Once | INTRAVENOUS | Status: DC

## 2013-10-05 MED ORDER — IOHEXOL 300 MG/ML  SOLN
100.0000 mL | Freq: Once | INTRAMUSCULAR | Status: AC | PRN
  Administered 2013-10-05: 100 mL via INTRAVENOUS

## 2013-10-05 NOTE — ED Notes (Signed)
Consulted EDP concerning CT with contrast. Pt lethargic and pt not alert enough to drink contrast. EDP give verbal order to do contrast with IV dye.

## 2013-10-05 NOTE — ED Notes (Signed)
Report called to staff at [facility]. Pt will be transported back to facility by EMS as facility has no personal available to transport.

## 2013-10-05 NOTE — ED Notes (Signed)
Pt from [facility]. Per EMS, pt sitting at lunch table and slid down in chair and vomited x2. Per EMS, facility denies LOC,syncope. Pt denies any pain at this time. Pt at baseline mentation. nad noted.

## 2013-10-05 NOTE — ED Notes (Signed)
EDP aware of pt rectal temperature. EDP reported to not discharge pt and that pt would be admitted per further tests. Pt family aware and verbalized understanding.

## 2013-10-05 NOTE — ED Notes (Signed)
Performed an In-and Out Cath on pt. Pt had a bowel movement and needed to be cleaned. Pt urinated on bed pad while getting cleaned and I was unable to get urine in catheter.

## 2013-10-05 NOTE — ED Provider Notes (Addendum)
CSN: [id number]     Arrival date & time 10/05/13  1239 History  This chart was scribed for Donnetta Hutching, MD by Dorothey Baseman, ED Scribe. This patient was seen in room APA03/APA03 and the patient's care was started at 1:48 PM.    Chief Complaint  Patient presents with  . Weakness  . Emesis   The history is provided by the patient and a relative (sister).   Level 5 caveat- dementia  HPI Comments: [place] T Bolander is a 72 y.o. female with a history of dementia who presents to the Emergency Department complaining of diffuse weakness with 2 episodes of associated emesis onset earlier today. Her sister states that the patient suddenly appeared to become very weak, so she helped the patient into a chair and the patient slid down in the chair. She denies falling or hitting her head. She states that the patient appears well and to be acting appropriately for her baseline. Patient also has a history of DM, HTN, hyperlipidemia.   Past Medical History  Diagnosis Date  . Diabetes mellitus   . Hypertension   . Arthritis   . Hyperlipidemia   . Vision loss of left eye   . Arthralgia   . Renal insufficiency   . Dementia    Past Surgical History  Procedure Laterality Date  . Total abdominal hysterectomy    . Colonoscopy    . Esophagogastroduodenoscopy     History reviewed. No pertinent family history. History  Substance Use Topics  . Smoking status: Never Smoker   . Smokeless tobacco: Not on file  . Alcohol Use: No   OB History   Grav Para Term Preterm Abortions TAB SAB Ect Mult Living                 Review of Systems  Unable to perform ROS   Allergies  Review of patient's allergies indicates no known allergies.  Home Medications   Current Outpatient Rx  Name  Route  Sig  Dispense  Refill  . memantine (NAMENDA) 10 MG tablet   Oral   Take 10 mg by mouth 2 (two) times daily.         . metFORMIN (GLUCOPHAGE) 500 MG tablet   Oral   Take 250 mg by mouth 2 (two) times daily with  a meal.         . oxyCODONE-acetaminophen (PERCOCET/ROXICET) 5-325 MG per tablet   Oral   Take 1 tablet by mouth 3 (three) times daily as needed for moderate pain or severe pain.         . pravastatin (PRAVACHOL) 20 MG tablet   Oral   Take 20 mg by mouth every evening.         . traMADol (ULTRAM) 50 MG tablet   Oral   Take 50 mg by mouth every 6 (six) hours as needed for moderate pain.         . promethazine (PHENERGAN) 25 MG tablet   Oral   Take 1 tablet (25 mg total) by mouth every 6 (six) hours as needed for nausea or vomiting.   10 tablet   0   . promethazine (PHENERGAN) 25 MG tablet   Oral   Take 1 tablet (25 mg total) by mouth every 6 (six) hours as needed for nausea or vomiting.   15 tablet   0    Triage Vitals: BP 105/88  Pulse 90  Temp(Src) 97.5 F (36.4 C) (Oral)  Resp 18  Ht 5'  5" (1.651 m)  Wt 105 lb (47.628 kg)  BMI 17.47 kg/m2  SpO2 95%  Physical Exam  Nursing note and vitals reviewed. Constitutional: She is oriented to person, place, and time. She appears well-developed and well-nourished. No distress.  HENT:  Head: Normocephalic and atraumatic.  Eyes: Conjunctivae are normal.  Neck: Normal range of motion. Neck supple.  Pulmonary/Chest: Effort normal. No respiratory distress.  Abdominal: She exhibits no distension.  Musculoskeletal: Normal range of motion.  Neurological: She is alert and oriented to person, place, and time.  Skin: Skin is warm and dry.  Psychiatric: She has a normal mood and affect. Her behavior is normal.    ED Course  Procedures (including critical care time)  DIAGNOSTIC STUDIES: Oxygen Saturation is 95% on room air, normal by my interpretation.    COORDINATION OF CARE: 1:50 PM- Will order blood labs. Will order IV fluids and Zofran to manage symptoms. Discussed treatment plan with patient at bedside and patient verbalized agreement.     Labs Review Labs Reviewed  COMPREHENSIVE METABOLIC PANEL - Abnormal;  Notable for the following:    Glucose, Bld 109 (*)    Creatinine, Ser 1.25 (*)    Albumin 3.0 (*)    Alkaline Phosphatase 135 (*)    GFR calc non Af Amer 43 (*)    GFR calc Af Amer 49 (*)    All other components within normal limits  CBC WITH DIFFERENTIAL - Abnormal; Notable for the following:    WBC 14.7 (*)    Neutrophils Relative % 91 (*)    Neutro Abs 13.4 (*)    Lymphocytes Relative 6 (*)    Monocytes Relative 2 (*)    All other components within normal limits  LIPASE, BLOOD - Abnormal; Notable for the following:    Lipase 113 (*)    All other components within normal limits  URINALYSIS, ROUTINE W REFLEX MICROSCOPIC - Abnormal; Notable for the following:    Hgb urine dipstick TRACE (*)    Protein, ur 30 (*)    All other components within normal limits  URINE MICROSCOPIC-ADD ON   Imaging Review Ct Abdomen Pelvis W Contrast  10/05/2013   CLINICAL DATA:  Elevated lipase, nausea  EXAM: CT ABDOMEN AND PELVIS WITH CONTRAST  TECHNIQUE: Multidetector CT imaging of the abdomen and pelvis was performed using the standard protocol following bolus administration of intravenous contrast.  CONTRAST:  OMNIPAQUE IOHEXOL 300 MG/ML  SOLN  COMPARISON:  CT 04/26/2009  FINDINGS: Lung bases are clear.  No pericardial fluid.  There are multiple innumerable hepatic cysts the throughout the liver. These reach confluence within the left lateral hepatic lobe. This pattern of cystic change is similar to comparison exam. The gallbladder, common bile duct, and pancreas are normal without evidence of inflammation. No intrahepatic biliary duct dilatation. The spleen and adrenal glands are normal. The kidneys have a lobular contour which is a combination of cortical scarring and cortical thinning. No focal mass lesions identified no hydronephrosis.  The stomach, small bowel, and colon are unremarkable.  The abdominal aorta is normal caliber. No retroperitoneal periportal lymphadenopathy. No free fluid the  pelvis. Post hysterectomy anatomy. The bladder is normal. No pelvic lymphadenopathy. There is remote fracture of the right inferior pubic ramus and superior pubic ramus with partial healing. Severe degenerate changes of the hips. No aggressive osseous lesion.  IMPRESSION: 1. Normal pancreas without evidence of pancreatitis. 2. Extensive multi cystic change within the liver is similar to comparison CT of 04/26/2009  3. Bilateral renal cortical thinning and scarring. 4. Fractures of the right superior and inferior pubic rami with partial union.   Electronically Signed   By: Genevive Bi M.D.   On: 10/05/2013 19:40   Dg Chest Portable 1 View  10/05/2013   CLINICAL DATA:  Weakness, vomiting.  EXAM: PORTABLE CHEST - 1 VIEW  COMPARISON:  09/28/2013  FINDINGS: Mild peribronchial thickening and interstitial prominence, likely mild bronchitic changes. Heart is normal size. Low lung volumes. No confluent opacities or effusions. No acute bony abnormality.  IMPRESSION: Mild bronchitic changes.   Electronically Signed   By: Charlett Nose M.D.   On: 10/05/2013 19:20    EKG Interpretation   None       MDM   1. Elevated lipase   2. Fever    Patient has no obvious epigastric pain. No vomiting in the emergency department. Discussed elevated lipase with the sister.   2130    Recheck chest x-ray negative. CT A/P shows no pancreatitis. Urinalysis normal. No acute abdomen. No vomiting. We'll discharge back to nursing home. Discharge medications Phenergan 25 mg I personally performed the services described in this documentation, which was scribed in my presence. The recorded information has been reviewed and is accurate.     Donnetta Hutching, MD 10/05/13 1757  Donnetta Hutching, MD 10/05/13 2132

## 2013-10-05 NOTE — ED Notes (Signed)
Lab obtained blood work earlier but sample was hemolyzed. Lab at bedside attempting to obtain another sample.

## 2013-10-12 ENCOUNTER — Ambulatory Visit (INDEPENDENT_AMBULATORY_CARE_PROVIDER_SITE_OTHER): Payer: Medicare Other | Admitting: Family Medicine

## 2013-10-12 ENCOUNTER — Encounter: Payer: Self-pay | Admitting: Family Medicine

## 2013-10-12 ENCOUNTER — Other Ambulatory Visit: Payer: Self-pay | Admitting: *Deleted

## 2013-10-12 VITALS — BP 128/86 | Ht 65.0 in | Wt 94.0 lb

## 2013-10-12 DIAGNOSIS — F0280 Dementia in other diseases classified elsewhere without behavioral disturbance: Secondary | ICD-10-CM

## 2013-10-12 DIAGNOSIS — E119 Type 2 diabetes mellitus without complications: Secondary | ICD-10-CM

## 2013-10-12 DIAGNOSIS — G309 Alzheimer's disease, unspecified: Secondary | ICD-10-CM

## 2013-10-12 DIAGNOSIS — R634 Abnormal weight loss: Secondary | ICD-10-CM

## 2013-10-12 LAB — POCT GLYCOSYLATED HEMOGLOBIN (HGB A1C): Hemoglobin A1C: 8

## 2013-10-12 MED ORDER — MEMANTINE HCL 5 MG PO TABS: 5.0000 mg | ORAL_TABLET | Freq: Two times a day (BID) | ORAL | Status: DC

## 2013-10-12 NOTE — Progress Notes (Signed)
   Subjective:    Patient ID: Caitlin Riddle, female    DOB: [birth year], 73 y.o.   MRN: [id number]  HPIHere for a check up. Was seen in ED for weakness on 10/05/13. No concerns. A1C today 8.0. Long discussion held with the sister as well as the nurse's aide patient seems to be eating fairly well but not as much is normal in addition to this dementia yeah fairly severe in addition to this walking is poor with the rheumatoid arthritis in her weakness. She often uses a wheelchair to get around.   Review of Systems Patient unable to give any review of systems but she politely answers. She denies any pain or discomfort currently family relates other than her getting weak with walking they feel like things are hanging in there.    Objective:   Physical Exam Her lungs are clear hearts regular neck no masses pulse normal she is getting thinner muscles are getting smaller       Assessment & Plan:  #1 Alzheimer's-progressive weight loss more than likely do to loss of appetite but could be related to the Namenda reduce the Namenda 5 mg twice daily. Recheck patient in 4 weeks. I don't find any evidence of any underlying cancer #2 weakness this is related to the fact that she is having a more difficult time eating she should walk as tolerated but use wheelchair when necessary  #3 I talked with the sister about the patient's condition and stated to her I would be open to having her come and talk with me further regarding the dementia yeah. As best I can tell they understand that this is a degenerative condition that has poor long-term outlook. This condition is terminal.  Check thyroid function do to weight loss  25 minutes spent with family followup 4 weeks

## 2013-10-12 NOTE — Progress Notes (Signed)
plz do, ok with me

## 2013-10-12 NOTE — Progress Notes (Signed)
Faxed over and notified

## 2013-10-12 NOTE — Telephone Encounter (Signed)
High Caitlin Riddle called and stated that [place] will not drink the Glucerna, nor can she afford the cost. They would like to know if you could write a prescription and fax over [phone]) for Sugar Free Carnation breakfast mix to try instead.

## 2013-10-15 ENCOUNTER — Ambulatory Visit (INDEPENDENT_AMBULATORY_CARE_PROVIDER_SITE_OTHER): Payer: Medicare Other | Admitting: Family Medicine

## 2013-10-15 ENCOUNTER — Encounter: Payer: Self-pay | Admitting: Family Medicine

## 2013-10-15 VITALS — BP 120/76 | Ht 65.0 in

## 2013-10-15 DIAGNOSIS — L8990 Pressure ulcer of unspecified site, unspecified stage: Secondary | ICD-10-CM

## 2013-10-15 LAB — BASIC METABOLIC PANEL
BUN: 13 mg/dL (ref 6–23)
CHLORIDE: 106 meq/L (ref 96–112)
CO2: 27 meq/L (ref 19–32)
Calcium: 9.6 mg/dL (ref 8.4–10.5)
Creat: 1.28 mg/dL — ABNORMAL HIGH (ref 0.50–1.10)
GLUCOSE: 99 mg/dL (ref 70–99)
Potassium: 5.6 mEq/L — ABNORMAL HIGH (ref 3.5–5.3)
SODIUM: 139 meq/L (ref 135–145)

## 2013-10-15 MED ORDER — CEPHALEXIN 500 MG PO CAPS: 500.0000 mg | ORAL_CAPSULE | Freq: Four times a day (QID) | ORAL | Status: AC

## 2013-10-15 MED ORDER — MUPIROCIN 2 % EX OINT: TOPICAL_OINTMENT | CUTANEOUS | Status: DC

## 2013-10-15 NOTE — Progress Notes (Signed)
   Subjective:    Patient ID: Caitlin Riddle, female    DOB: 1941/04/21, 73 y.o.   MRN: [id number]  HPI Patient has a sore on her right foot that has been present for 2-3 days now. Patient states that it is painful and swelling is noted. No fevers noted at this time.  Patient relates some tenderness she does not know how it got there PMH benign/Alzheimer's Review of Systems Patient has dementia cannot do review of systems    Objective:   Physical Exam  On examination she has a small pressure sore on the right side of the ankle there is no sign of any type of inflammation otherwise. Cath is normal the foot is normal.      Assessment & Plan:  This is a pressure sore it is very small padding recommended padding on the mattress recommended avoiding crossing feet recommended all of this was written down her sister will bring it back to the long-term care facility she is at. We'll also have home health come out to see them.

## 2013-10-16 LAB — TSH: TSH: 12.275 u[IU]/mL — ABNORMAL HIGH (ref 0.350–4.500)

## 2013-10-17 ENCOUNTER — Encounter: Payer: Self-pay | Admitting: Family Medicine

## 2013-10-17 DIAGNOSIS — E039 Hypothyroidism, unspecified: Secondary | ICD-10-CM | POA: Insufficient documentation

## 2013-10-22 ENCOUNTER — Other Ambulatory Visit: Payer: Self-pay | Admitting: Family Medicine

## 2013-10-28 ENCOUNTER — Other Ambulatory Visit: Payer: Self-pay | Admitting: Family Medicine

## 2013-10-29 DIAGNOSIS — E119 Type 2 diabetes mellitus without complications: Secondary | ICD-10-CM

## 2013-10-29 DIAGNOSIS — L89509 Pressure ulcer of unspecified ankle, unspecified stage: Secondary | ICD-10-CM

## 2013-10-29 DIAGNOSIS — G309 Alzheimer's disease, unspecified: Secondary | ICD-10-CM

## 2013-10-29 DIAGNOSIS — F0280 Dementia in other diseases classified elsewhere without behavioral disturbance: Secondary | ICD-10-CM

## 2013-10-29 DIAGNOSIS — L8992 Pressure ulcer of unspecified site, stage 2: Secondary | ICD-10-CM

## 2013-10-29 NOTE — Telephone Encounter (Signed)
Ok times 4 

## 2013-11-03 ENCOUNTER — Other Ambulatory Visit: Payer: Self-pay | Admitting: *Deleted

## 2013-11-15 ENCOUNTER — Ambulatory Visit: Payer: Medicare Other | Admitting: Family Medicine

## 2013-11-15 ENCOUNTER — Ambulatory Visit (INDEPENDENT_AMBULATORY_CARE_PROVIDER_SITE_OTHER): Payer: Medicare Other | Admitting: Family Medicine

## 2013-11-15 ENCOUNTER — Encounter: Payer: Self-pay | Admitting: Family Medicine

## 2013-11-15 VITALS — BP 112/78 | Ht 65.0 in | Wt 98.0 lb

## 2013-11-15 DIAGNOSIS — R634 Abnormal weight loss: Secondary | ICD-10-CM

## 2013-11-15 DIAGNOSIS — G309 Alzheimer's disease, unspecified: Principal | ICD-10-CM

## 2013-11-15 DIAGNOSIS — F0280 Dementia in other diseases classified elsewhere without behavioral disturbance: Secondary | ICD-10-CM

## 2013-11-15 NOTE — Progress Notes (Signed)
   Subjective:    Patient ID: Caitlin Riddle, female    DOB: July 04, 1941, 73 y.o.   MRN: [id number]  HPI Patient is here today for a 4 week f/u.  They said she has a sore on her right foot that the home health nurse has wrapped. It is currently wrapped right now.   They said she is not eating well. Apparently patient feeds herself without assistance. It was recommended that they do try to cut her food up and assist    Review of Systems Patient denies any problems currently. Unable to give a full review of systems because of her dementia    Objective:   Physical Exam Patient alert but not oriented able to answer simple questions Lungs clear no crackles respiratory rate normal Heart regular no murmurs Extremities no edema skin warm dry       Assessment & Plan:  Ankle sore-home health was appropriately bandaging medicine putting a compression wrap on it. It should progressively get better we will connect with home health to have them send [place] digital pictures if possible of this area  Alzheimer's to progressive patient does not do well with eating. Losing weight. I recommended that they assist her with her meals. G-tube is not an option. The progressive nature of Alzheimer's was discussed with the in rest home caretaker as well as family member. Eventually this patient may progress to the point where she will need full nursing care  Greater than half the time was spent in counseling recommend followup in 4 weeks

## 2013-11-17 ENCOUNTER — Telehealth: Payer: Self-pay | Admitting: Family Medicine

## 2013-11-17 NOTE — Telephone Encounter (Signed)
Lupita Leash from [facility] sent a form over (attached to chart) and had said person to tell us That this needs to be signed now. She wanted me to get you out of a room an do it while she Waited.  Informed her I would put in a note send back the form and you would get to it as soon as You could today, if you could get to it today.

## 2013-11-17 NOTE — Telephone Encounter (Signed)
This form was signed 

## 2013-11-17 NOTE — Telephone Encounter (Signed)
Form up front ready for pick up

## 2013-11-26 ENCOUNTER — Telehealth: Payer: Self-pay | Admitting: *Deleted

## 2013-11-26 NOTE — Telephone Encounter (Signed)
Miranda at home health called and stated the patients wound on her right ankle is no better- has thick layer over it. Advised Miranda patient would need office visit to see wound. Miranda advised Lupita Leash at [facility] to call our office to schedule office visit.

## 2013-11-26 NOTE — Telephone Encounter (Signed)
When we see patient we can make referral to wound center at [facility]

## 2013-11-29 ENCOUNTER — Ambulatory Visit (INDEPENDENT_AMBULATORY_CARE_PROVIDER_SITE_OTHER): Payer: Medicare Other | Admitting: Family Medicine

## 2013-11-29 ENCOUNTER — Encounter: Payer: Self-pay | Admitting: Family Medicine

## 2013-11-29 VITALS — Ht 65.0 in | Wt 98.0 lb

## 2013-11-29 DIAGNOSIS — L89519 Pressure ulcer of right ankle, unspecified stage: Secondary | ICD-10-CM | POA: Insufficient documentation

## 2013-11-29 DIAGNOSIS — L89509 Pressure ulcer of unspecified ankle, unspecified stage: Secondary | ICD-10-CM

## 2013-11-29 DIAGNOSIS — L8990 Pressure ulcer of unspecified site, unspecified stage: Secondary | ICD-10-CM

## 2013-11-29 MED ORDER — MEMANTINE HCL 5 MG PO TABS
5.0000 mg | ORAL_TABLET | Freq: Two times a day (BID) | ORAL | Status: DC
Start: 2013-11-29 — End: 2017-07-25

## 2013-11-29 NOTE — Progress Notes (Signed)
   Subjective:    Patient ID: Caitlin Riddle, female    DOB: 02-25-1941, 73 y.o.   MRN: [id number]  HPI Patient arrives with sore on right ankle for a while. Been being dressed by home health but they report no improvement. There is a small sore on the right it out her aspect of the right ankle. This is noted to be stage II pressure ulcer. The staff is trying to had been getting to prevent further breakdown they are trying to help her with nutrition but she has Alzheimer's and eats poorly no fevers  Review of Systems No fevers no other reported issues    Objective:   Physical Exam Lungs are clear heart is regular right ankle stage II pressure ulcer noted on the right side       Assessment & Plan:  santel ointment-I. spoke with home health they will try this. If ongoing troubles to followup. Warnings discussed. F/u 3 to 4 weeks  No sign of osteomyelitis currently

## 2013-11-30 ENCOUNTER — Telehealth: Payer: Self-pay | Admitting: Family Medicine

## 2013-11-30 NOTE — Telephone Encounter (Signed)
Patients guardian/sister calling because she has questions for Dr Lorin Picket and would like for him to call her back. That's all the info she gave.

## 2013-12-03 NOTE — Telephone Encounter (Signed)
A discussion was held with the sister regarding what was going on. It sounds that they're trying to do the best he can to help with her condition the importance of proper nutrition and keeping her dry was discussed also how Alzheimer's tends to be a progressive illness that eventually leads to poor nature addition in other health problems was discussed as well

## 2013-12-13 ENCOUNTER — Ambulatory Visit: Payer: Medicare Other | Admitting: Family Medicine

## 2013-12-15 ENCOUNTER — Ambulatory Visit: Payer: Medicare Other | Admitting: Family Medicine

## 2013-12-20 ENCOUNTER — Other Ambulatory Visit: Payer: Self-pay | Admitting: Family Medicine

## 2013-12-28 ENCOUNTER — Encounter: Payer: Self-pay | Admitting: Family Medicine

## 2013-12-28 ENCOUNTER — Ambulatory Visit (INDEPENDENT_AMBULATORY_CARE_PROVIDER_SITE_OTHER): Payer: Medicare Other | Admitting: Family Medicine

## 2013-12-28 VITALS — BP 112/72 | Ht 65.0 in | Wt 97.0 lb

## 2013-12-28 DIAGNOSIS — E039 Hypothyroidism, unspecified: Secondary | ICD-10-CM

## 2013-12-28 DIAGNOSIS — L89509 Pressure ulcer of unspecified ankle, unspecified stage: Secondary | ICD-10-CM

## 2013-12-28 DIAGNOSIS — G309 Alzheimer's disease, unspecified: Secondary | ICD-10-CM

## 2013-12-28 DIAGNOSIS — L89519 Pressure ulcer of right ankle, unspecified stage: Secondary | ICD-10-CM

## 2013-12-28 DIAGNOSIS — L8990 Pressure ulcer of unspecified site, unspecified stage: Secondary | ICD-10-CM

## 2013-12-28 DIAGNOSIS — F0280 Dementia in other diseases classified elsewhere without behavioral disturbance: Secondary | ICD-10-CM

## 2013-12-28 MED ORDER — LEVOTHYROXINE SODIUM 25 MCG PO TABS: 25.0000 ug | ORAL_TABLET | Freq: Every day | ORAL | Status: DC

## 2013-12-28 NOTE — Addendum Note (Signed)
Addended by: Lilyan Punt A on: 12/28/2013 06:30 PM   Modules accepted: Orders

## 2013-12-28 NOTE — Progress Notes (Addendum)
   Subjective:    Patient ID: Caitlin Riddle, female    DOB: 1940/12/06, 73 y.o.   MRN: [id number]  HPI Patient arrives for a follow up on right ankle sore. [facility] said the sore is getting better. The patient is unable to give any history she is very pleasant not in any pain looks in good condition currently. Caretaker is with her as well as a sister. All questions were asked and answered appropriately.  Review of Systems See above no reported fevers coughing chest pain shortness of breath    Objective:   Physical Exam  Lungs clear heart regular wound is noted on the side of the foot      Assessment & Plan:  Alzheimer's-no loss but no progression. Keep everything as is. Encourage eating  Subpar nutrition this as part of the Alzheimer's she just doesn't have the urge to eat staff will try to encourage her as best as possible long-term outlook is toward weight loss and eventually dying.  Persistent pressure sore-this area is just not healing it is doubtful that it will heal up totally but I did leave this patient deserves to have a wound care center look at it we will set her up at [facility] hospital wound center  Patient was diagnosed with hypothyroidism a couple months ago the order was sent in and apparently patient has not ever gotten it so therefore we'll send the order in again in 25 mcg a day recheck TSH in 3 months

## 2014-01-05 DIAGNOSIS — G309 Alzheimer's disease, unspecified: Secondary | ICD-10-CM

## 2014-01-05 DIAGNOSIS — L8992 Pressure ulcer of unspecified site, stage 2: Secondary | ICD-10-CM

## 2014-01-05 DIAGNOSIS — E119 Type 2 diabetes mellitus without complications: Secondary | ICD-10-CM

## 2014-01-05 DIAGNOSIS — F0280 Dementia in other diseases classified elsewhere without behavioral disturbance: Secondary | ICD-10-CM

## 2014-01-05 DIAGNOSIS — L89509 Pressure ulcer of unspecified ankle, unspecified stage: Secondary | ICD-10-CM

## 2014-01-17 ENCOUNTER — Emergency Department (HOSPITAL_COMMUNITY): Payer: Medicare Other

## 2014-01-17 ENCOUNTER — Encounter (HOSPITAL_COMMUNITY): Payer: Self-pay | Admitting: Emergency Medicine

## 2014-01-17 ENCOUNTER — Emergency Department (HOSPITAL_COMMUNITY)
Admission: EM | Admit: 2014-01-17 | Discharge: 2014-01-17 | Disposition: A | Discharge status: Home / Self Care | Payer: Medicare Other | Attending: Emergency Medicine | Admitting: Emergency Medicine

## 2014-01-17 DIAGNOSIS — Y92129 Unspecified place in nursing home as the place of occurrence of the external cause: Secondary | ICD-10-CM

## 2014-01-17 DIAGNOSIS — E119 Type 2 diabetes mellitus without complications: Secondary | ICD-10-CM | POA: Insufficient documentation

## 2014-01-17 DIAGNOSIS — I10 Essential (primary) hypertension: Secondary | ICD-10-CM | POA: Insufficient documentation

## 2014-01-17 DIAGNOSIS — J3489 Other specified disorders of nose and nasal sinuses: Secondary | ICD-10-CM | POA: Insufficient documentation

## 2014-01-17 DIAGNOSIS — Y9210 Unspecified residential institution as the place of occurrence of the external cause: Secondary | ICD-10-CM | POA: Insufficient documentation

## 2014-01-17 DIAGNOSIS — F0390 Unspecified dementia without behavioral disturbance: Secondary | ICD-10-CM | POA: Insufficient documentation

## 2014-01-17 DIAGNOSIS — R55 Syncope and collapse: Secondary | ICD-10-CM | POA: Insufficient documentation

## 2014-01-17 DIAGNOSIS — W1809XA Striking against other object with subsequent fall, initial encounter: Secondary | ICD-10-CM | POA: Insufficient documentation

## 2014-01-17 DIAGNOSIS — Z79899 Other long term (current) drug therapy: Secondary | ICD-10-CM | POA: Insufficient documentation

## 2014-01-17 DIAGNOSIS — Y939 Activity, unspecified: Secondary | ICD-10-CM | POA: Insufficient documentation

## 2014-01-17 DIAGNOSIS — N390 Urinary tract infection, site not specified: Secondary | ICD-10-CM | POA: Insufficient documentation

## 2014-01-17 DIAGNOSIS — Z8739 Personal history of other diseases of the musculoskeletal system and connective tissue: Secondary | ICD-10-CM | POA: Insufficient documentation

## 2014-01-17 DIAGNOSIS — S0990XA Unspecified injury of head, initial encounter: Secondary | ICD-10-CM | POA: Insufficient documentation

## 2014-01-17 DIAGNOSIS — H269 Unspecified cataract: Secondary | ICD-10-CM | POA: Insufficient documentation

## 2014-01-17 DIAGNOSIS — W19XXXA Unspecified fall, initial encounter: Secondary | ICD-10-CM

## 2014-01-17 DIAGNOSIS — E785 Hyperlipidemia, unspecified: Secondary | ICD-10-CM | POA: Insufficient documentation

## 2014-01-17 LAB — CBC WITH DIFFERENTIAL/PLATELET
BASOS ABS: 0 10*3/uL (ref 0.0–0.1)
Basophils Relative: 0 % (ref 0–1)
EOS ABS: 0.3 10*3/uL (ref 0.0–0.7)
EOS PCT: 3 % (ref 0–5)
HCT: 32.4 % — ABNORMAL LOW (ref 36.0–46.0)
Hemoglobin: 10.8 g/dL — ABNORMAL LOW (ref 12.0–15.0)
LYMPHS PCT: 21 % (ref 12–46)
Lymphs Abs: 1.7 10*3/uL (ref 0.7–4.0)
MCH: 26.2 pg (ref 26.0–34.0)
MCHC: 33.3 g/dL (ref 30.0–36.0)
MCV: 78.5 fL (ref 78.0–100.0)
MONO ABS: 0.5 10*3/uL (ref 0.1–1.0)
Monocytes Relative: 6 % (ref 3–12)
Neutro Abs: 5.8 10*3/uL (ref 1.7–7.7)
Neutrophils Relative %: 70 % (ref 43–77)
Platelets: 275 10*3/uL (ref 150–400)
RBC: 4.13 MIL/uL (ref 3.87–5.11)
RDW: 15.1 % (ref 11.5–15.5)
WBC: 8.3 10*3/uL (ref 4.0–10.5)

## 2014-01-17 LAB — COMPREHENSIVE METABOLIC PANEL
ALT: 8 U/L (ref 0–35)
AST: 18 U/L (ref 0–37)
Albumin: 3.3 g/dL — ABNORMAL LOW (ref 3.5–5.2)
Alkaline Phosphatase: 99 U/L (ref 39–117)
BUN: 15 mg/dL (ref 6–23)
CALCIUM: 9.5 mg/dL (ref 8.4–10.5)
CO2: 24 meq/L (ref 19–32)
Chloride: 104 mEq/L (ref 96–112)
Creatinine, Ser: 1.35 mg/dL — ABNORMAL HIGH (ref 0.50–1.10)
GFR, EST AFRICAN AMERICAN: 45 mL/min — AB (ref >90)
GFR, EST NON AFRICAN AMERICAN: 39 mL/min — AB (ref >90)
Glucose, Bld: 95 mg/dL (ref 70–99)
Potassium: 4.4 mEq/L (ref 3.7–5.3)
Sodium: 141 mEq/L (ref 137–147)
Total Bilirubin: 0.2 mg/dL — ABNORMAL LOW (ref 0.3–1.2)
Total Protein: 8 g/dL (ref 6.0–8.3)

## 2014-01-17 LAB — URINALYSIS, ROUTINE W REFLEX MICROSCOPIC
Bilirubin Urine: NEGATIVE
Glucose, UA: NEGATIVE mg/dL
Ketones, ur: NEGATIVE mg/dL
NITRITE: NEGATIVE
Protein, ur: 30 mg/dL — AB
SPECIFIC GRAVITY, URINE: 1.01 (ref 1.005–1.030)
UROBILINOGEN UA: 0.2 mg/dL (ref 0.0–1.0)
pH: 7 (ref 5.0–8.0)

## 2014-01-17 LAB — TROPONIN I

## 2014-01-17 LAB — URINE MICROSCOPIC-ADD ON

## 2014-01-17 MED ORDER — SODIUM CHLORIDE 0.9 % IV SOLN
Freq: Once | INTRAVENOUS | Status: AC
  Administered 2014-01-17: 11:00:00 via INTRAVENOUS

## 2014-01-17 MED ORDER — CEPHALEXIN 500 MG PO CAPS
500.0000 mg | ORAL_CAPSULE | Freq: Once | ORAL | Status: AC
  Administered 2014-01-17: 500 mg via ORAL
  Filled 2014-01-17: qty 1

## 2014-01-17 MED ORDER — CEPHALEXIN 500 MG PO CAPS: 500.0000 mg | ORAL_CAPSULE | Freq: Four times a day (QID) | ORAL | Status: DC

## 2014-01-17 NOTE — ED Provider Notes (Signed)
CSN: [id number]     Arrival date & time 01/17/14  6004 History   This chart was scribed for Pauline Aus, PA-C by Ladona Ridgel Day, ED scribe. This patient was seen in room APA06/APA06 and the patient's care was started at 0843.  Chief Complaint  Patient presents with  . Near Syncope  . Fall   Level 5 Caveat to Pt's Dementia  The history is provided by the patient and the EMS personnel. The history is limited by the condition of the patient. No language interpreter was used.   HPI Comments: [place] T Hoogendoorn is a 73 y.o. female with a hx of Dementia and DM brought in by ambulance, who presents to the Emergency Department from [facility] nursing home for reports that she fell this AM. Reports from staff that pt "falls randomly a lot." She denies any pain and denies any HA, chest pain, hip pain, dizziness, vomiting, visual changes or abdominal pain. She does not recall the events of her fall. Sister of the pt reports that due to her dementia, pt frequently denies pain.  Spoke w/staff at [facility] and pt has had a couple of falls recently in which she just drops to the floor. On this instance, nurse reports fall was witnessed by another nursing home resident who stated she fell from a standing position, struck her head on a cabinet, did not lose consciousness and was moving around but unable to get up.  Nurse reported that pt c/o her head hurting and EMS was called for transport.  Past Medical History  Diagnosis Date  . Diabetes mellitus   . Hypertension   . Arthritis   . Hyperlipidemia   . Vision loss of left eye   . Arthralgia   . Renal insufficiency   . Dementia    Past Surgical History  Procedure Laterality Date  . Total abdominal hysterectomy    . Colonoscopy    . Esophagogastroduodenoscopy     No family history on file. History  Substance Use Topics  . Smoking status: Never Smoker   . Smokeless tobacco: Never Used  . Alcohol Use: No   OB History   Grav Para Term Preterm  Abortions TAB SAB Ect Mult Living                 Review of Systems  Unable to perform ROS  Allergies  Review of patient's allergies indicates no known allergies.  Home Medications   Current Outpatient Rx  Name  Route  Sig  Dispense  Refill  . levothyroxine (SYNTHROID, LEVOTHROID) 25 MCG tablet   Oral   Take 1 tablet (25 mcg total) by mouth daily before breakfast.   90 tablet   2   . memantine (NAMENDA) 5 MG tablet   Oral   Take 1 tablet (5 mg total) by mouth 2 (two) times daily.   60 tablet   6   . metFORMIN (GLUCOPHAGE) 500 MG tablet   Oral   Take 250 mg by mouth 3 (three) times daily with meals.         . pravastatin (PRAVACHOL) 20 MG tablet      TAKE 1 TABLET BY MOUTH ONCE DAILY.   30 tablet   2   . promethazine (PHENERGAN) 25 MG tablet   Oral   Take 1 tablet (25 mg total) by mouth every 6 (six) hours as needed for nausea or vomiting.   15 tablet   0   . Propylene Glycol (SYSTANE BALANCE OP)  Ophthalmic   Apply 1 drop to eye 3 (three) times daily.         . traMADol (ULTRAM) 50 MG tablet      TAKE (1) TABLET BY MOUTH EVERY (6) HOURS AS NEEDED FOR PAIN.   30 tablet   4    Triage Vitals: BP 138/58  Pulse 93  Temp(Src) 98.7 F (37.1 C) (Oral)  Resp 18  Ht 5\' 5"  (1.651 m)  Wt 100 lb (45.36 kg)  BMI 16.64 kg/m2  SpO2 99%  Physical Exam  Nursing note and vitals reviewed. Constitutional: She is oriented to person, place, and time. She appears well-developed and well-nourished. No distress.  Well appearing but frail  HENT:  Head: Normocephalic and atraumatic.  Mucous membranes are dry  Eyes:  Cataract left eye  Neck: Neck supple.  C-collar applied at triage  Cardiovascular: Normal rate, regular rhythm and normal heart sounds.   No murmur heard. Pulmonary/Chest: Effort normal and breath sounds normal. No respiratory distress. She has no wheezes. She has no rales. She exhibits no tenderness.  Abdominal: Soft. She exhibits no distension and  no mass. There is no tenderness. There is no rebound and no guarding.  Musculoskeletal: Normal range of motion. She exhibits no edema and no tenderness.  Full ROM x4 extremities.  Mild STS of the left distal wrist.  Radial pulse brisk, sensation intact.  No bony deformity  Lymphadenopathy:    She has no cervical adenopathy.  Neurological: She is alert and oriented to person, place, and time. She exhibits normal muscle tone. Coordination normal.  4/5 strength of the bilateral upper and lower extremities.  Oriented x 2  Skin: Skin is warm and dry.  Dime sized healing ulceration to lateral right ankle  Psychiatric: Her behavior is normal.    ED Course  Procedures (including critical care time) DIAGNOSTIC STUDIES: Oxygen Saturation is 99% on room air, normal by my interpretation.    COORDINATION OF CARE: At 930 AM Discussed treatment plan with patient which includes EKG, IV fluids, blood work. Patient and sister agrees.   Labs Review Labs Reviewed  CBC WITH DIFFERENTIAL - Abnormal; Notable for the following:    Hemoglobin 10.8 (*)    HCT 32.4 (*)    All other components within normal limits  COMPREHENSIVE METABOLIC PANEL - Abnormal; Notable for the following:    Creatinine, Ser 1.35 (*)    Albumin 3.3 (*)    Total Bilirubin 0.2 (*)    GFR calc non Af Amer 39 (*)    GFR calc Af Amer 45 (*)    All other components within normal limits  URINALYSIS, ROUTINE W REFLEX MICROSCOPIC - Abnormal; Notable for the following:    Hgb urine dipstick TRACE (*)    Protein, ur 30 (*)    Leukocytes, UA SMALL (*)    All other components within normal limits  URINE MICROSCOPIC-ADD ON - Abnormal; Notable for the following:    Squamous Epithelial / LPF FEW (*)    All other components within normal limits  URINE CULTURE  TROPONIN I   Imaging Review Dg Chest 1 View  01/17/2014   CLINICAL DATA:  Fall, syncope  EXAM: CHEST - 1 VIEW  COMPARISON:  10/05/2013  FINDINGS: Cardiomediastinal silhouette is  stable. No acute infiltrate or pleural effusion. Probable chronic mild interstitial prominence. No pulmonary edema.  IMPRESSION: No acute infiltrate or pulmonary edema. Probable chronic mild interstitial prominence.   Electronically Signed   By: 10/07/2013.D.  On: 01/17/2014 11:31   Dg Pelvis 1-2 Views  01/17/2014   CLINICAL DATA:  Fall.  EXAM: PELVIS - 1-2 VIEW  COMPARISON:  None.  FINDINGS: No acute fracture or dislocation is noted. Probable old fracture of right inferior pubic ramus is noted. Severe narrowing of both hip joints is noted consistent with degenerative joint disease.  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 Probable old fracture of right inferior pubic ramus is noted. No definite evidence of acute fracture or dislocation is noted.   Electronically Signed   By: Roque Lias M.D.   On: 01/17/2014 11:36   Dg Wrist Complete Left  01/20/2014   CLINICAL DATA:  Pain after a fall.  EXAM: LEFT WRIST - COMPLETE 3+ VIEW  COMPARISON:  None.  FINDINGS: Severe osteopenia. Severe joint space narrowing. Subchondral cyst formation. Soft tissue swelling. No definite fracture.  IMPRESSION: Negative for fracture.   Electronically Signed   By: Davonna Belling M.D.   On: 01/20/2014 09:49   Ct Head Wo Contrast  01/17/2014   CLINICAL DATA:  Larey Seat and hit head. Patient does not remember accident.  EXAM: CT HEAD WITHOUT CONTRAST  CT CERVICAL SPINE WITHOUT CONTRAST  TECHNIQUE: Multidetector CT imaging of the head and cervical spine was performed following the standard protocol without intravenous contrast. Multiplanar CT image reconstructions of the cervical spine were also generated.  COMPARISON:  09/28/2013  FINDINGS: CT HEAD FINDINGS  Ventricles are normal in configuration. There is ventricular and sulcal enlargement reflecting moderate atrophy, without change. No convincing hydrocephalus.  No parenchymal masses or mass effect. There is no evidence of a recent cortical infarct. Old  right basal ganglia infarct, stable. Patchy white matter hypoattenuation is noted consistent with moderate chronic microvascular ischemic change, also stable.  There are no extra-axial masses or abnormal fluid collections.  No intracranial hemorrhage.  There are fluid levels in the sphenoid sinuses. There is minor left maxillary and mild bilateral ethmoid sinus mucosal thickening with mild inferior frontal sinus mucosal thickening. Clear mastoid air cells.  No skull fracture.  CT CERVICAL SPINE FINDINGS  No fracture or spondylolisthesis.  Moderate loss of disc height at C5-C6. Mild reversal of the normal cervical lordosis centered at C5.  Bones are demineralized. The central spinal canal and neural foramina are well preserved.  Small hypo attenuating nodule in the left thyroid lobe. Soft tissues are otherwise unremarkable. Clear lung apices.  IMPRESSION: HEAD CT:  No acute intracranial abnormalities.  No skull fracture.  CERVICAL CT:  No fracture or acute finding.   Electronically Signed   By: Amie Portland M.D.   On: 01/17/2014 11:21   Ct Cervical Spine Wo Contrast  01/17/2014   CLINICAL DATA:  Larey Seat and hit head. Patient does not remember accident.  EXAM: CT HEAD WITHOUT CONTRAST  CT CERVICAL SPINE WITHOUT CONTRAST  TECHNIQUE: Multidetector CT imaging of the head and cervical spine was performed following the standard protocol without intravenous contrast. Multiplanar CT image reconstructions of the cervical spine were also generated.  COMPARISON:  09/28/2013  FINDINGS: CT HEAD FINDINGS  Ventricles are normal in configuration. There is ventricular and sulcal enlargement reflecting moderate atrophy, without change. No convincing hydrocephalus.  No parenchymal masses or mass effect. There is no evidence of a recent cortical infarct. Old right basal ganglia infarct, stable. Patchy white matter hypoattenuation is noted consistent with moderate chronic microvascular ischemic change, also stable.  There are no  extra-axial masses or abnormal fluid collections.  No intracranial  hemorrhage.  There are fluid levels in the sphenoid sinuses. There is minor left maxillary and mild bilateral ethmoid sinus mucosal thickening with mild inferior frontal sinus mucosal thickening. Clear mastoid air cells.  No skull fracture.  CT CERVICAL SPINE FINDINGS  No fracture or spondylolisthesis.  Moderate loss of disc height at C5-C6. Mild reversal of the normal cervical lordosis centered at C5.  Bones are demineralized. The central spinal canal and neural foramina are well preserved.  Small hypo attenuating nodule in the left thyroid lobe. Soft tissues are otherwise unremarkable. Clear lung apices.  IMPRESSION: HEAD CT:  No acute intracranial abnormalities.  No skull fracture.  CERVICAL CT:  No fracture or acute finding.   Electronically Signed   By: Amie Portland M.D.   On: 01/17/2014 11:21    EKG Interpretation   Date/Time:  Monday January 17 2014 09:03:56 EDT Ventricular Rate:  90 PR Interval:  126 QRS Duration: 68 QT Interval:  362 QTC Calculation: 442 R Axis:   44 Text Interpretation:  Normal sinus rhythm Septal infarct , age  undetermined Abnormal ECG When compared with ECG of 28-Sep-2013 08:45,  Septal infarct is now Present Nonspecific T wave abnormality now evident  in Anterior leads Confirmed by ZACKOWSKI  MD, SCOTT [phone]) on 01/17/2014  9:17:31 AM      MDM   Final diagnoses:  Fall at nursing home  UTI (lower urinary tract infection)    Patient resides at assisted living facility and has reported hx of frequent falls per patient's sister and nursing home staff.  She is cooperative, calm and does not appear in distress.  She has requested a meal tray and ate w/o difficulty.  She continues to deny pain, vitals stable and moving all extremities w/o difficulty.  Sister reports to me that she appears to be at her neurological baseline.    Pt also seen by Dr. Deretha Emory and care plan discussed which includes  return to facility with rx for Keflex and urine culture pending.    I personally performed the services described in this documentation, which was scribed in my presence. The recorded information has been reviewed and is accurate.      Annett Boxwell L. Canio Winokur, PA-C 01/24/14 1344

## 2014-01-17 NOTE — ED Notes (Addendum)
Patient brought in via EMS from [facility]. Alert, oriented to person. Airway patent. Per EMS personal patient had witnessed frall., unsure if due to syncope. Per staff patient "falls randomly a lot." Patient denies any pain. Per [facility] staff patient hit head on counter before falling to carpeted floor. C-collar on. No acute distress noted. Per EMS personal patient blood sugar 106, normal sinus on monitor. Patient does not recall fall.

## 2014-01-17 NOTE — Discharge Instructions (Signed)
Urinary Tract Infection °A urinary tract infection (UTI) can occur any place along the urinary tract. The tract includes the kidneys, ureters, bladder, and urethra. A type of germ called bacteria often causes a UTI. UTIs are often helped with antibiotic medicine.  °HOME CARE  °· If given, take antibiotics as told by your doctor. Finish them even if you start to feel better. °· Drink enough fluids to keep your pee (urine) clear or pale yellow. °· Avoid tea, drinks with caffeine, and bubbly (carbonated) drinks. °· Pee often. Avoid holding your pee in for a long time. °· Pee before and after having sex (intercourse). °· Wipe from front to back after you poop (bowel movement) if you are a woman. Use each tissue only once. °GET HELP RIGHT AWAY IF:  °· You have back pain. °· You have lower belly (abdominal) pain. °· You have chills. °· You feel sick to your stomach (nauseous). °· You throw up (vomit). °· Your burning or discomfort with peeing does not go away. °· You have a fever. °· Your symptoms are not better in 3 days. °MAKE SURE YOU:  °· Understand these instructions. °· Will watch your condition. °· Will get help right away if you are not doing well or get worse. °Document Released: 03/11/2008 Document Revised: 06/17/2012 Document Reviewed: 04/23/2012 °Exit[employer]® Patient Information ©2014 [employer]. ° °

## 2014-01-17 NOTE — ED Provider Notes (Signed)
Medical screening examination/treatment/procedure(s) were conducted as a shared visit with non-physician practitioner(s) and myself.  I personally evaluated the patient during the encounter.   EKG Interpretation   Date/Time:  Monday January 17 2014 09:03:56 EDT Ventricular Rate:  90 PR Interval:  126 QRS Duration: 68 QT Interval:  362 QTC Calculation: 442 R Axis:   44 Text Interpretation:  Normal sinus rhythm Septal infarct , age  undetermined Abnormal ECG When compared with ECG of 28-Sep-2013 08:45,  Septal infarct is now Present Nonspecific T wave abnormality now evident  in Anterior leads Confirmed by Idelle Reimann  MD, Jeidi Gilles [phone]) on 01/17/2014  9:17:31 AM      Results for orders placed during the hospital encounter of 01/17/14  CBC WITH DIFFERENTIAL      Result Value Ref Range   WBC 8.3  4.0 - 10.5 K/uL   RBC 4.13  3.87 - 5.11 MIL/uL   Hemoglobin 10.8 (*) 12.0 - 15.0 g/dL   HCT 79.3 (*) 90.3 - 00.9 %   MCV 78.5  78.0 - 100.0 fL   MCH 26.2  26.0 - 34.0 pg   MCHC 33.3  30.0 - 36.0 g/dL   RDW 23.3  00.7 - 62.2 %   Platelets 275  150 - 400 K/uL   Neutrophils Relative % 70  43 - 77 %   Neutro Abs 5.8  1.7 - 7.7 K/uL   Lymphocytes Relative 21  12 - 46 %   Lymphs Abs 1.7  0.7 - 4.0 K/uL   Monocytes Relative 6  3 - 12 %   Monocytes Absolute 0.5  0.1 - 1.0 K/uL   Eosinophils Relative 3  0 - 5 %   Eosinophils Absolute 0.3  0.0 - 0.7 K/uL   Basophils Relative 0  0 - 1 %   Basophils Absolute 0.0  0.0 - 0.1 K/uL  COMPREHENSIVE METABOLIC PANEL      Result Value Ref Range   Sodium 141  137 - 147 mEq/L   Potassium 4.4  3.7 - 5.3 mEq/L   Chloride 104  96 - 112 mEq/L   CO2 24  19 - 32 mEq/L   Glucose, Bld 95  70 - 99 mg/dL   BUN 15  6 - 23 mg/dL   Creatinine, Ser 6.33 (*) 0.50 - 1.10 mg/dL   Calcium 9.5  8.4 - 35.4 mg/dL   Total Protein 8.0  6.0 - 8.3 g/dL   Albumin 3.3 (*) 3.5 - 5.2 g/dL   AST 18  0 - 37 U/L   ALT 8  0 - 35 U/L   Alkaline Phosphatase 99  39 - 117 U/L   Total  Bilirubin 0.2 (*) 0.3 - 1.2 mg/dL   GFR calc non Af Amer 39 (*) >90 mL/min   GFR calc Af Amer 45 (*) >90 mL/min  URINALYSIS, ROUTINE W REFLEX MICROSCOPIC      Result Value Ref Range   Color, Urine YELLOW  YELLOW   APPearance CLEAR  CLEAR   Specific Gravity, Urine 1.010  1.005 - 1.030   pH 7.0  5.0 - 8.0   Glucose, UA NEGATIVE  NEGATIVE mg/dL   Hgb urine dipstick TRACE (*) NEGATIVE   Bilirubin Urine NEGATIVE  NEGATIVE   Ketones, ur NEGATIVE  NEGATIVE mg/dL   Protein, ur 30 (*) NEGATIVE mg/dL   Urobilinogen, UA 0.2  0.0 - 1.0 mg/dL   Nitrite NEGATIVE  NEGATIVE   Leukocytes, UA SMALL (*) NEGATIVE  TROPONIN I  Result Value Ref Range   Troponin I <0.30  <0.30 ng/mL  URINE MICROSCOPIC-ADD ON      Result Value Ref Range   Squamous Epithelial / LPF FEW (*) RARE   WBC, UA 7-10  <3 WBC/hpf   RBC / HPF 0-2  <3 RBC/hpf   Dg Chest 1 View  01/17/2014   CLINICAL DATA:  Fall, syncope  EXAM: CHEST - 1 VIEW  COMPARISON:  10/05/2013  FINDINGS: Cardiomediastinal silhouette is stable. No acute infiltrate or pleural effusion. Probable chronic mild interstitial prominence. No pulmonary edema.  IMPRESSION: No acute infiltrate or pulmonary edema. Probable chronic mild interstitial prominence.   Electronically Signed   By: Natasha Mead M.D.   On: 01/17/2014 11:31   Dg Pelvis 1-2 Views  01/17/2014   CLINICAL DATA:  Fall.  EXAM: PELVIS - 1-2 VIEW  COMPARISON:  None.  FINDINGS: No acute fracture or dislocation is noted. Probable old fracture of right inferior pubic ramus is noted. Severe narrowing of both hip joints is noted consistent with degenerative joint disease.  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 Probable old fracture of right inferior pubic ramus is noted. No definite evidence of acute fracture or dislocation is noted.   Electronically Signed   By: Roque Lias M.D.   On: 01/17/2014 11:36   Ct Head Wo Contrast  01/17/2014   CLINICAL DATA:  Larey Seat and hit head.  Patient does not remember accident.  EXAM: CT HEAD WITHOUT CONTRAST  CT CERVICAL SPINE WITHOUT CONTRAST  TECHNIQUE: Multidetector CT imaging of the head and cervical spine was performed following the standard protocol without intravenous contrast. Multiplanar CT image reconstructions of the cervical spine were also generated.  COMPARISON:  09/28/2013  FINDINGS: CT HEAD FINDINGS  Ventricles are normal in configuration. There is ventricular and sulcal enlargement reflecting moderate atrophy, without change. No convincing hydrocephalus.  No parenchymal masses or mass effect. There is no evidence of a recent cortical infarct. Old right basal ganglia infarct, stable. Patchy white matter hypoattenuation is noted consistent with moderate chronic microvascular ischemic change, also stable.  There are no extra-axial masses or abnormal fluid collections.  No intracranial hemorrhage.  There are fluid levels in the sphenoid sinuses. There is minor left maxillary and mild bilateral ethmoid sinus mucosal thickening with mild inferior frontal sinus mucosal thickening. Clear mastoid air cells.  No skull fracture.  CT CERVICAL SPINE FINDINGS  No fracture or spondylolisthesis.  Moderate loss of disc height at C5-C6. Mild reversal of the normal cervical lordosis centered at C5.  Bones are demineralized. The central spinal canal and neural foramina are well preserved.  Small hypo attenuating nodule in the left thyroid lobe. Soft tissues are otherwise unremarkable. Clear lung apices.  IMPRESSION: HEAD CT:  No acute intracranial abnormalities.  No skull fracture.  CERVICAL CT:  No fracture or acute finding.   Electronically Signed   By: Amie Portland M.D.   On: 01/17/2014 11:21   Ct Cervical Spine Wo Contrast  01/17/2014   CLINICAL DATA:  Larey Seat and hit head. Patient does not remember accident.  EXAM: CT HEAD WITHOUT CONTRAST  CT CERVICAL SPINE WITHOUT CONTRAST  TECHNIQUE: Multidetector CT imaging of the head and cervical spine was  performed following the standard protocol without intravenous contrast. Multiplanar CT image reconstructions of the cervical spine were also generated.  COMPARISON:  09/28/2013  FINDINGS: CT HEAD FINDINGS  Ventricles are normal in configuration. There is ventricular and sulcal enlargement reflecting moderate atrophy,  without change. No convincing hydrocephalus.  No parenchymal masses or mass effect. There is no evidence of a recent cortical infarct. Old right basal ganglia infarct, stable. Patchy white matter hypoattenuation is noted consistent with moderate chronic microvascular ischemic change, also stable.  There are no extra-axial masses or abnormal fluid collections.  No intracranial hemorrhage.  There are fluid levels in the sphenoid sinuses. There is minor left maxillary and mild bilateral ethmoid sinus mucosal thickening with mild inferior frontal sinus mucosal thickening. Clear mastoid air cells.  No skull fracture.  CT CERVICAL SPINE FINDINGS  No fracture or spondylolisthesis.  Moderate loss of disc height at C5-C6. Mild reversal of the normal cervical lordosis centered at C5.  Bones are demineralized. The central spinal canal and neural foramina are well preserved.  Small hypo attenuating nodule in the left thyroid lobe. Soft tissues are otherwise unremarkable. Clear lung apices.  IMPRESSION: HEAD CT:  No acute intracranial abnormalities.  No skull fracture.  CERVICAL CT:  No fracture or acute finding.   Electronically Signed   By: Amie Portland M.D.   On: 01/17/2014 11:21    Patient seen by me. Brought in by EMS from [facility]. Patient was alert upon arrival. Patient had a fall struck her head on the way down. No loss of consciousness but was confused for shortly after the fall. Head CT neck CT negative x-rays of her hips are negative patient currently has no complaints. According to family members patient does have a history of falling frequently. Does not appear that it was a  true syncopal episode. Patient lab workup shows questionable early urinary tract infection. One could wait for culture results. Patient certainly has no symptoms of dysuria.  Shelda Jakes, MD 01/17/14 1314

## 2014-01-18 ENCOUNTER — Telehealth: Payer: Self-pay | Admitting: Family Medicine

## 2014-01-18 NOTE — Telephone Encounter (Signed)
Calling to check on orders that were faxed over.

## 2014-01-18 NOTE — Telephone Encounter (Signed)
The orders were signed as requested 

## 2014-01-18 NOTE — Telephone Encounter (Signed)
[facility] verbal orders

## 2014-01-19 ENCOUNTER — Other Ambulatory Visit: Payer: Self-pay | Admitting: Family Medicine

## 2014-01-19 LAB — URINE CULTURE
Colony Count: NO GROWTH
Culture: NO GROWTH

## 2014-01-20 ENCOUNTER — Encounter (HOSPITAL_COMMUNITY): Payer: Self-pay | Admitting: Emergency Medicine

## 2014-01-20 ENCOUNTER — Emergency Department (HOSPITAL_COMMUNITY): Payer: Medicare Other

## 2014-01-20 ENCOUNTER — Emergency Department (HOSPITAL_COMMUNITY)
Admission: EM | Admit: 2014-01-20 | Discharge: 2014-01-20 | Disposition: A | Discharge status: Home / Self Care | Payer: Medicare Other | Attending: Emergency Medicine | Admitting: Emergency Medicine

## 2014-01-20 DIAGNOSIS — Y939 Activity, unspecified: Secondary | ICD-10-CM | POA: Insufficient documentation

## 2014-01-20 DIAGNOSIS — Y9210 Unspecified residential institution as the place of occurrence of the external cause: Secondary | ICD-10-CM | POA: Insufficient documentation

## 2014-01-20 DIAGNOSIS — I10 Essential (primary) hypertension: Secondary | ICD-10-CM | POA: Insufficient documentation

## 2014-01-20 DIAGNOSIS — W19XXXA Unspecified fall, initial encounter: Secondary | ICD-10-CM | POA: Insufficient documentation

## 2014-01-20 DIAGNOSIS — Z79899 Other long term (current) drug therapy: Secondary | ICD-10-CM | POA: Insufficient documentation

## 2014-01-20 DIAGNOSIS — E785 Hyperlipidemia, unspecified: Secondary | ICD-10-CM | POA: Insufficient documentation

## 2014-01-20 DIAGNOSIS — Z8739 Personal history of other diseases of the musculoskeletal system and connective tissue: Secondary | ICD-10-CM | POA: Insufficient documentation

## 2014-01-20 DIAGNOSIS — Z8742 Personal history of other diseases of the female genital tract: Secondary | ICD-10-CM | POA: Insufficient documentation

## 2014-01-20 DIAGNOSIS — E119 Type 2 diabetes mellitus without complications: Secondary | ICD-10-CM | POA: Insufficient documentation

## 2014-01-20 DIAGNOSIS — Z043 Encounter for examination and observation following other accident: Secondary | ICD-10-CM | POA: Insufficient documentation

## 2014-01-20 DIAGNOSIS — H269 Unspecified cataract: Secondary | ICD-10-CM | POA: Insufficient documentation

## 2014-01-20 DIAGNOSIS — F0390 Unspecified dementia without behavioral disturbance: Secondary | ICD-10-CM | POA: Insufficient documentation

## 2014-01-20 DIAGNOSIS — Z792 Long term (current) use of antibiotics: Secondary | ICD-10-CM | POA: Insufficient documentation

## 2014-01-20 NOTE — ED Provider Notes (Signed)
CSN: [id number]     Arrival date & time 01/20/14  2703 History  This chart was scribed for Caitlin Munch, MD by Quintella Reichert, ED scribe.  This patient was seen in room APA14/APA14 and the patient's care was started at 8:30 AM.   Chief Complaint  Patient presents with  . Fall    The history is provided by the patient, the EMS personnel and the nursing home. No language interpreter was used.   Level 5 Caveat: Dementia  HPI Comments: Caitlin Riddle is a 73 y.o. female with h/o dementia, vision loss of left eye, DM, HTN, hyperlipidemia, and rheumatoid arthralgias brought in by EMS to the Emergency Department for an unwitnessed fall that occurred this morning.  Pt is a resident of an extend[facility] care facility.  History is provided by EMS who was called about a fall with possible LOC.  The fall was not witnessed but care facility told EMS she was likely using her walker in her room when she fell.  On EMS arrival pt was lying on her left side but conscious and neurologically intact.  Initially she was complaining of a headache but since arrival to the ED has been complaining of upper back pain and right foot/leg pain.  However currently pt states "I feel good" and denies pain to any area.  She is unclear as to what happened.  She does not remember before or after the fall or being helped by paramedics.  She denies confusion, disorientation, focal weakness, or visual changes.  Per care facility notes, pt is semi-ambulatory with a walker and is constantly disoriented at baseline.  Pt received a head and neck CT 3 days ago for a similar episode and they were unremarkable.  PCP is Dr. Lilyan Punt   Past Medical History  Diagnosis Date  . Diabetes mellitus   . Hypertension   . Arthritis   . Hyperlipidemia   . Vision loss of left eye   . Arthralgia   . Renal insufficiency   . Dementia     Past Surgical History  Procedure Laterality Date  . Total abdominal hysterectomy    . Colonoscopy     . Esophagogastroduodenoscopy      No family history on file.   History  Substance Use Topics  . Smoking status: Never Smoker   . Smokeless tobacco: Never Used  . Alcohol Use: No    OB History   Grav Para Term Preterm Abortions TAB SAB Ect Mult Living                   Review of Systems  Unable to perform ROS: Dementia      Allergies  Review of patient's allergies indicates no known allergies.  Home Medications   Prior to Admission medications   Medication Sig Start Date End Date Taking? Authorizing Provider  cephALEXin (KEFLEX) 500 MG capsule Take 1 capsule (500 mg total) by mouth 4 (four) times daily. For 7 days 01/17/14   Tammy L. Triplett, PA-C  levothyroxine (SYNTHROID, LEVOTHROID) 25 MCG tablet Take 1 tablet (25 mcg total) by mouth daily before breakfast. 12/28/13   Babs Sciara, MD  memantine (NAMENDA) 5 MG tablet Take 1 tablet (5 mg total) by mouth 2 (two) times daily. 11/29/13   Babs Sciara, MD  metFORMIN (GLUCOPHAGE) 500 MG tablet Take 250 mg by mouth 3 (three) times daily with meals.    Historical Provider, MD  pravastatin (PRAVACHOL) 20 MG tablet TAKE 1 TABLET BY  MOUTH ONCE DAILY. 12/20/13   Babs Sciara, MD  promethazine (PHENERGAN) 25 MG tablet Take 1 tablet (25 mg total) by mouth every 6 (six) hours as needed for nausea or vomiting. 10/05/13   Donnetta Hutching, MD  Propylene Glycol (SYSTANE BALANCE OP) Apply 1 drop to eye 3 (three) times daily.    Historical Provider, MD  traMADol (ULTRAM) 50 MG tablet TAKE (1) TABLET BY MOUTH EVERY (6) HOURS AS NEEDED FOR PAIN. 10/28/13   Babs Sciara, MD   BP 158/70  Pulse 79  Temp(Src) 98.4 F (36.9 C) (Oral)  Resp 16  SpO2 96%  Physical Exam  Nursing note and vitals reviewed. Constitutional: She appears well-developed and well-nourished. No distress.  HENT:  Head: Normocephalic and atraumatic.  Eyes: Conjunctivae and EOM are normal.  Cataract in left eye  Neck: Neck supple. No spinous process tenderness and no  muscular tenderness present. No rigidity. No edema, no erythema and normal range of motion present.  Cardiovascular: Normal rate and regular rhythm.   Pulmonary/Chest: Effort normal and breath sounds normal. No stridor. No respiratory distress. She has no wheezes. She has no rales.  Abdominal: She exhibits no distension.  Musculoskeletal: She exhibits no edema.       Right shoulder: Normal.       Left shoulder: Normal.       Right hip: Normal.       Left hip: Normal.       Right knee: Normal.       Left knee: Normal.       Right ankle: Normal.       Left ankle: Normal.  Neurological: She is alert. No cranial nerve deficit. Coordination normal.  Symmetric 4/5 strength in UE/ LE with diffuse atrophy.  No asymmetry.  Oriented to self and place. MAES, tracks visually, appropriately  Skin: Skin is warm and dry.  Psychiatric: Her speech is delayed. She is slowed and withdrawn. Cognition and memory are impaired.    ED Course  Procedures (including critical care time)  DIAGNOSTIC STUDIES: Oxygen Saturation is 96% on room air, normal by my interpretation.    I reviewed the EMR, including ED visit 3d pta, and imaging from that visit  COORDINATION OF CARE: 8:39 AM-Discussed treatment plan which includes discharge home with pt at bedside and pt agreed to plan.     MDM   Final diagnoses:  Fall    I personally performed the services described in this documentation, which was scribed in my presence. The recorded information has been reviewed and is accurate.   Patient presents from nursing facility after an unwitnessed fall. Patient has multiple medical problems, including dementia, and at baseline is noted to be disoriented.  In addition, patient's frequent falls.  Here, after initially complaining of pain, patient has no complaints throughout my exam, remained hemodynamically stable, neurologically appropriate, consistent with prior evaluations.  Patient has a supple neck, with no pain  throughout range of motion or on palpation. Given his absence of concerning physical exam findings, her recent imaging studies, additional imaging was not performed or indicated. Given the absence of decompensation, her hemodynamic stability, she's discharged back to a monitored facility in stable condition.  Caitlin Munch, MD 01/20/14 [phone]

## 2014-01-20 NOTE — ED Notes (Signed)
Called [facility] and spoke with Tammy. States someone will be coming to pick pt up.

## 2014-01-20 NOTE — ED Notes (Signed)
Resident of [name]. Unwitnessed  Fall this morning. Arrived fully immobilized with LSB and C-Collar. Pt initially c/o head pain per EMS, Pt now c/o upper back pain and right foot/leg pain.

## 2014-01-20 NOTE — ED Notes (Signed)
EDP at bedside. PT cleared from [facility]. NAD.

## 2014-01-20 NOTE — ED Notes (Signed)
Pt c/o pain to left wrist when ride arrived. Pt sent for xray and ice applied to area of pain to left wrist.

## 2014-01-20 NOTE — Discharge Instructions (Signed)
Fall Prevention in [facility]  As a nursing home patient, your condition and the treatments you receive can increase your risk for falls. Some additional risk factors for falls include:  Being in an unfamiliar environment.  Taking certain medicines.  Your tubing requirements, such as intravenous (IV) therapy or catheters. It is important that you learn how to decrease fall risks. Below are important tips that can help prevent falls. SAFETY TIPS FOR PREVENTING FALLS Talk about your risk of falling.  Ask your caregiver why you are at risk for falling. Is it your medicine, illness, tubing placement, or something else?  Make a plan with your caregiver to keep you safe from falls.  Ask your caregiver or pharmacist about side effect of your medicines. Some medicines can make you dizzy or affect your coordination. Ask for help.  Ask for help before getting out of bed. You may need to press your call button.  Ask for assistance in getting you safely to the toilet.  Ask for a walker or cane to be put at your bedside. Ask that most of the side rails on your bed be placed up before your caregiver leaves the room.  Ask family or friends to sit with you.  Ask for things that are out of your reach, such as your glasses, hearing aids, telephone, bedside table, or call button. Follow these tips to avoid falling:  Stay lying or seated, rather than standing, while waiting for help.  Wear rubber-soled slippers or shoes whenever you walk in the hospital.  Avoid quick, sudden movements.  Change positions slowly.  Sit on the side of your bed before standing.  Stand up slowly and wait before you start to walk.  Let your caregiver know if there is a spill on the floor.  Pay careful attention to the medical equipment, electrical cords, and tubes around you.  When you need help, use your call button by your bed or in the bathroom. Wait for one of your caregivers to help you.  If  you feel dizzy or unsure of your footing, return to bed and wait for assistance.  Avoid being distracted by the TV, telephone, or another person in your room.  Do not lean or support yourself on rolling objects, such as IV poles or bedside tables. Document Released: 09/20/2000 Document Revised: 09/09/2012 Document Reviewed: 05/31/2012 [facility] Patient Information 2014 [place], [place].

## 2014-01-20 NOTE — ED Notes (Signed)
Pt took herself off of backboard.

## 2014-02-01 ENCOUNTER — Other Ambulatory Visit: Payer: Self-pay | Admitting: Family Medicine

## 2014-02-03 ENCOUNTER — Other Ambulatory Visit: Payer: Self-pay | Admitting: *Deleted

## 2014-02-18 ENCOUNTER — Ambulatory Visit (INDEPENDENT_AMBULATORY_CARE_PROVIDER_SITE_OTHER): Payer: Medicare Other | Admitting: Family Medicine

## 2014-02-18 DIAGNOSIS — G309 Alzheimer's disease, unspecified: Principal | ICD-10-CM

## 2014-02-18 DIAGNOSIS — F0280 Dementia in other diseases classified elsewhere without behavioral disturbance: Secondary | ICD-10-CM

## 2014-02-18 NOTE — Progress Notes (Signed)
Patient ID: Caitlin Riddle, female   DOB: 14-Feb-1941, 73 y.o.   MRN: [id number]  Patient's sister, Milon Score and her daughter Elnita Maxwell are here to discuss having patient removed from [facility] facility. Her medicaid is about to run out and the patient is not being monitored by the staff at the facility and she has knots on her head from this. They also have a FL2 form that they need the doctor to fill out.  (Form on front of chart)  20 minutes was spent with the sister and her daughter discussing Alzheimer's the difference between Alzheimer's and dementia yeah the fact that medications don't do dramatically well also help the patient's health issues have progressed to the point where caring for her at the living assisted [facility] is becoming impossible. She is now at the point of needing skilled care

## 2014-02-23 ENCOUNTER — Other Ambulatory Visit: Payer: Self-pay | Admitting: Family Medicine

## 2014-02-24 NOTE — Telephone Encounter (Signed)
May refill this +4 additional refills 

## 2014-03-03 ENCOUNTER — Telehealth: Payer: Self-pay | Admitting: Family Medicine

## 2014-03-03 NOTE — Telephone Encounter (Signed)
I have are ready filled this out. It was at least a week ago that I finished. French Ana did help some. I also thought that it went up front and it was undermined depression that family was R. he called in. Please look around check which Racey check with Erica's area. Is not on my desk. Thank you, sorry I couldn't be of more help, obviously if it is not found then we will have to go through all of this again which would not be fun.

## 2014-03-03 NOTE — Telephone Encounter (Signed)
Calling to check on status of FL2 form she dropped off about 2 weeks ago.

## 2014-03-04 NOTE — Telephone Encounter (Signed)
Notified patients sister ready for pick up.

## 2014-03-23 ENCOUNTER — Other Ambulatory Visit: Payer: Self-pay | Admitting: Family Medicine

## 2014-03-25 ENCOUNTER — Ambulatory Visit: Payer: Medicare Other | Admitting: Family Medicine

## 2014-03-28 ENCOUNTER — Encounter: Payer: Self-pay | Admitting: Family Medicine

## 2014-03-28 ENCOUNTER — Ambulatory Visit (INDEPENDENT_AMBULATORY_CARE_PROVIDER_SITE_OTHER): Payer: Medicare Other | Admitting: Family Medicine

## 2014-03-28 VITALS — BP 124/68 | Ht 65.0 in | Wt 102.0 lb

## 2014-03-28 DIAGNOSIS — M069 Rheumatoid arthritis, unspecified: Secondary | ICD-10-CM

## 2014-03-28 DIAGNOSIS — E039 Hypothyroidism, unspecified: Secondary | ICD-10-CM

## 2014-03-28 DIAGNOSIS — E119 Type 2 diabetes mellitus without complications: Secondary | ICD-10-CM

## 2014-03-28 DIAGNOSIS — R634 Abnormal weight loss: Secondary | ICD-10-CM

## 2014-03-28 DIAGNOSIS — G309 Alzheimer's disease, unspecified: Secondary | ICD-10-CM

## 2014-03-28 DIAGNOSIS — F0280 Dementia in other diseases classified elsewhere without behavioral disturbance: Secondary | ICD-10-CM

## 2014-03-28 LAB — POCT GLYCOSYLATED HEMOGLOBIN (HGB A1C): Hemoglobin A1C: 7

## 2014-03-28 NOTE — Progress Notes (Signed)
   Subjective:    Patient ID: [name], female    DOB: Jul 23, 1941, 73 y.o.   MRN: [id number]  Diabetes She presents for her initial diabetic visit. She has type 2 diabetes mellitus. She is compliant with treatment all of the time. She does not see a podiatrist.Eye exam current: pt not sure.  A1C today 7.0. She has struggled with her weight the staff at the facility T. she is staying at his encouraging her to eat better. They're encouraging her meds to be on a regular basis They deny that she's having any rectal bleeding vomiting abdominal pain She does have rheumatoid arthritis complains at times with pain but for the most part does not complain   Review of Systems She denies any chest pain abdominal pain vomiting diarrhea she states she has not hurting. Unfortunately she has Alzheimer's makes it very difficult to review of systems    Objective:   Physical Exam Her lungs are clear hearts regular pulse normal extremities no edema skin warm dry foot exam normal       Assessment & Plan:  #1 Alzheimer's-progressive. Patient stable though. 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 The staff at her facility was made aware of this. Following could still occur even with significant attention given to her #3 her weight is actually improved compared where was which is a plus #4 diabetes good control continue current measures followup 3-4 months #5 she has a history hypothyroidism and hyperlipidemia on her next visit in 3-4 months she will need Hemoglobin A1c, lipid, liver, metabolic 7, CBC.

## 2014-04-02 ENCOUNTER — Emergency Department (HOSPITAL_COMMUNITY)
Admission: EM | Admit: 2014-04-02 | Discharge: 2014-04-02 | Disposition: A | Discharge status: Home / Self Care | Payer: Medicare Other | Attending: Emergency Medicine | Admitting: Emergency Medicine

## 2014-04-02 ENCOUNTER — Emergency Department (HOSPITAL_COMMUNITY): Payer: Medicare Other

## 2014-04-02 ENCOUNTER — Encounter (HOSPITAL_COMMUNITY): Payer: Self-pay | Admitting: Emergency Medicine

## 2014-04-02 DIAGNOSIS — R220 Localized swelling, mass and lump, head: Secondary | ICD-10-CM | POA: Diagnosis present

## 2014-04-02 DIAGNOSIS — K055 Other periodontal diseases: Secondary | ICD-10-CM | POA: Insufficient documentation

## 2014-04-02 DIAGNOSIS — K056 Periodontal disease, unspecified: Secondary | ICD-10-CM

## 2014-04-02 DIAGNOSIS — Z79899 Other long term (current) drug therapy: Secondary | ICD-10-CM | POA: Diagnosis not present

## 2014-04-02 DIAGNOSIS — I10 Essential (primary) hypertension: Secondary | ICD-10-CM | POA: Insufficient documentation

## 2014-04-02 DIAGNOSIS — Z792 Long term (current) use of antibiotics: Secondary | ICD-10-CM | POA: Diagnosis not present

## 2014-04-02 DIAGNOSIS — F0390 Unspecified dementia without behavioral disturbance: Secondary | ICD-10-CM | POA: Insufficient documentation

## 2014-04-02 DIAGNOSIS — Z8739 Personal history of other diseases of the musculoskeletal system and connective tissue: Secondary | ICD-10-CM | POA: Insufficient documentation

## 2014-04-02 DIAGNOSIS — E119 Type 2 diabetes mellitus without complications: Secondary | ICD-10-CM | POA: Diagnosis not present

## 2014-04-02 DIAGNOSIS — R221 Localized swelling, mass and lump, neck: Principal | ICD-10-CM

## 2014-04-02 DIAGNOSIS — E785 Hyperlipidemia, unspecified: Secondary | ICD-10-CM | POA: Insufficient documentation

## 2014-04-02 DIAGNOSIS — Z87448 Personal history of other diseases of urinary system: Secondary | ICD-10-CM | POA: Diagnosis not present

## 2014-04-02 LAB — CBC WITH DIFFERENTIAL/PLATELET
Basophils Absolute: 0 10*3/uL (ref 0.0–0.1)
Basophils Relative: 0 % (ref 0–1)
Eosinophils Absolute: 0.3 10*3/uL (ref 0.0–0.7)
Eosinophils Relative: 2 % (ref 0–5)
HEMATOCRIT: 28.8 % — AB (ref 36.0–46.0)
Hemoglobin: 9.7 g/dL — ABNORMAL LOW (ref 12.0–15.0)
LYMPHS ABS: 3.3 10*3/uL (ref 0.7–4.0)
LYMPHS PCT: 28 % (ref 12–46)
MCH: 26.5 pg (ref 26.0–34.0)
MCHC: 33.7 g/dL (ref 30.0–36.0)
MCV: 78.7 fL (ref 78.0–100.0)
MONOS PCT: 6 % (ref 3–12)
Monocytes Absolute: 0.8 10*3/uL (ref 0.1–1.0)
Neutro Abs: 7.6 10*3/uL (ref 1.7–7.7)
Neutrophils Relative %: 64 % (ref 43–77)
Platelets: 314 10*3/uL (ref 150–400)
RBC: 3.66 MIL/uL — AB (ref 3.87–5.11)
RDW: 15.5 % (ref 11.5–15.5)
WBC: 11.9 10*3/uL — AB (ref 4.0–10.5)

## 2014-04-02 LAB — BASIC METABOLIC PANEL
BUN: 18 mg/dL (ref 6–23)
CALCIUM: 9.3 mg/dL (ref 8.4–10.5)
CO2: 22 meq/L (ref 19–32)
CREATININE: 1.42 mg/dL — AB (ref 0.50–1.10)
Chloride: 105 mEq/L (ref 96–112)
GFR calc Af Amer: 42 mL/min — ABNORMAL LOW (ref >90)
GFR calc non Af Amer: 36 mL/min — ABNORMAL LOW (ref >90)
GLUCOSE: 88 mg/dL (ref 70–99)
Potassium: 4.9 mEq/L (ref 3.7–5.3)
Sodium: 139 mEq/L (ref 137–147)

## 2014-04-02 MED ORDER — CLINDAMYCIN HCL 150 MG PO CAPS: 150.0000 mg | ORAL_CAPSULE | Freq: Four times a day (QID) | ORAL | Status: DC

## 2014-04-02 MED ORDER — CLINDAMYCIN PHOSPHATE 300 MG/50ML IV SOLN
INTRAVENOUS | Status: AC
  Filled 2014-04-02: qty 50

## 2014-04-02 MED ORDER — SODIUM CHLORIDE 0.9 % IV SOLN
INTRAVENOUS | Status: DC
  Administered 2014-04-02: 13:00:00 via INTRAVENOUS

## 2014-04-02 MED ORDER — IOHEXOL 300 MG/ML  SOLN
80.0000 mL | Freq: Once | INTRAMUSCULAR | Status: AC | PRN
  Administered 2014-04-02: 80 mL via INTRAVENOUS

## 2014-04-02 MED ORDER — ONDANSETRON HCL 4 MG/2ML IJ SOLN: 4.0000 mg | Freq: Once | INTRAMUSCULAR | Status: DC

## 2014-04-02 MED ORDER — CLINDAMYCIN PHOSPHATE 300 MG/50ML IV SOLN
300.0000 mg | Freq: Once | INTRAVENOUS | Status: AC
  Administered 2014-04-02: 300 mg via INTRAVENOUS

## 2014-04-02 MED ORDER — SODIUM CHLORIDE 0.9 % IV BOLUS (SEPSIS)
250.0000 mL | Freq: Once | INTRAVENOUS | Status: AC
  Administered 2014-04-02: 250 mL via INTRAVENOUS

## 2014-04-02 NOTE — ED Notes (Signed)
2 staff assist to restroom. Returned to room. No distress.

## 2014-04-02 NOTE — ED Notes (Signed)
MD at bedside. 

## 2014-04-02 NOTE — ED Notes (Signed)
Patient with c/o right sided facial swelling. Poor dentition to right lower molars. Patient denies pain. Resident of [facility]. No dentist per care taker.

## 2014-04-02 NOTE — ED Provider Notes (Signed)
CSN: [id number]     Arrival date & time 04/02/14  9485 History  This chart was scribed for Fredia Sorrow, MD by Martinique Peace, ED Scribe. The patient was seen in [place]. The patient's care was started at 11:15 AM.    Chief Complaint  Patient presents with  . Facial Swelling      The history is provided by the patient. No language interpreter was used.   HPI Comments: Caitlin Riddle is a 73 y.o. female who presents to the Emergency Department complaining of facial swelling onset two days ago that she states is painful, rated 5/10. Pt denies any tooth pain or neck pain or other associated symptoms. Pt is also experiencing redness and swelling to right side of her chin and submandibular region. Pt lives in a nursing home.   Past Medical History  Diagnosis Date  . Diabetes mellitus   . Hypertension   . Arthritis   . Hyperlipidemia   . Vision loss of left eye   . Arthralgia   . Renal insufficiency   . Dementia    Past Surgical History  Procedure Laterality Date  . Total abdominal hysterectomy    . Colonoscopy    . Esophagogastroduodenoscopy     No family history on file. History  Substance Use Topics  . Smoking status: Never Smoker   . Smokeless tobacco: Never Used  . Alcohol Use: No   OB History   Grav Para Term Preterm Abortions TAB SAB Ect Mult Living                 Review of Systems  Constitutional: Negative for fever and chills.  HENT: Negative for rhinorrhea and sore throat.        Swelling of right submandibular area.   Eyes: Positive for visual disturbance (left eye).  Respiratory: Negative for cough and shortness of breath.   Cardiovascular: Negative for chest pain.  Gastrointestinal: Negative for nausea, vomiting, diarrhea and abdominal distention.  Genitourinary: Negative for dysuria and hematuria.  Musculoskeletal: Negative for joint swelling and neck pain.       Facial swelling to right side of face  Skin: Negative for rash.  Neurological:  Negative for headaches.  Hematological: Does not bruise/bleed easily.      Allergies  Review of patient's allergies indicates no known allergies.  Home Medications   Prior to Admission medications   Medication Sig Start Date End Date Taking? Authorizing Provider  ACCU-CHEK AVIVA PLUS test strip  03/14/14  Yes Historical Provider, MD  ACCU-CHEK SOFTCLIX LANCETS lancets  03/14/14  Yes Historical Provider, MD  Blood Glucose Monitoring Suppl (ACCU-CHEK AVIVA PLUS) W/DEVICE KIT  03/14/14  Yes Historical Provider, MD  cadexomer iodine (IODOSORB) 0.9 % gel Apply 1 application topically every 3 (three) days.   Yes Historical Provider, MD  collagenase (SANTYL) ointment Apply 1 application topically daily.   Yes Historical Provider, MD  levothyroxine (SYNTHROID, LEVOTHROID) 25 MCG tablet Take 1 tablet (25 mcg total) by mouth daily before breakfast. 12/28/13  Yes Kathyrn Drown, MD  memantine (NAMENDA) 5 MG tablet Take 1 tablet (5 mg total) by mouth 2 (two) times daily. 11/29/13  Yes Kathyrn Drown, MD  metFORMIN (GLUCOPHAGE) 500 MG tablet TAKE (1/2) TABLET ($RemoveBefor'250mg'LDszdkRmUmIl$ ) BY MOUTH WITH MEALS.   Yes Kathyrn Drown, MD  Nutritional Supplements (CARNATION INST BREAKFAST JUICE) LIQD Take 1 Bottle by mouth daily.   Yes Historical Provider, MD  pravastatin (PRAVACHOL) 20 MG tablet TAKE 1 TABLET BY MOUTH ONCE  DAILY.   Yes Kathyrn Drown, MD  promethazine (PHENERGAN) 25 MG tablet Take 1 tablet (25 mg total) by mouth every 6 (six) hours as needed for nausea or vomiting. 10/05/13  Yes Nat Christen, MD  Propylene Glycol (SYSTANE BALANCE OP) Apply 1 drop to eye 3 (three) times daily.   Yes Historical Provider, MD  traMADol (ULTRAM) 50 MG tablet TAKE (1) TABLET BY MOUTH EVERY (6) HOURS AS NEEDED FOR PAIN.   Yes Kathyrn Drown, MD  Zinc Oxide (DESITIN CREAMY EX) Apply 1 application topically daily as needed (after bowel movement.).   Yes Historical Provider, MD  clindamycin (CLEOCIN) 150 MG capsule Take 1 capsule (150 mg total) by  mouth every 6 (six) hours. 04/02/14   Fredia Sorrow, MD   Triage Vitals: BP 150/78  Pulse 85  Temp(Src) 98.3 F (36.8 C) (Oral)  Resp 16  Ht $R'5\' 5"'zW$  (1.651 m)  Wt 98 lb (44.453 kg)  BMI 16.31 kg/m2  SpO2 100% Physical Exam  Nursing note and vitals reviewed. Constitutional: She is oriented to person, place, and time. She appears well-developed and well-nourished. No distress.  HENT:  Head: Normocephalic and atraumatic.  Mouth/Throat: Oropharynx is clear and moist.  Eyes: Conjunctivae and EOM are normal.  Neck: Neck supple. No tracheal deviation present.  Swelling, tenderness and redness to right submandibular area.  Poor dentition to right lower molars.   Cardiovascular: Normal rate, regular rhythm and normal heart sounds.   No murmur heard. Pulmonary/Chest: Effort normal. No respiratory distress.  Abdominal: Bowel sounds are normal. There is no tenderness.  Lymphadenopathy:    She has no cervical adenopathy.  Neurological: She is alert and oriented to person, place, and time. No cranial nerve deficit. She exhibits normal muscle tone. Coordination normal.  Skin: Skin is warm and dry. No rash noted.  Psychiatric: She has a normal mood and affect. Her behavior is normal.    ED Course  Procedures (including critical care time) DIAGNOSTIC STUDIES: Oxygen Saturation is 100% on room air, normal by my interpretation.    COORDINATION OF CARE: 11:20 AM- Treatment plan was discussed with patient who verbalizes understanding and agrees.    Labs Review Labs Reviewed  CBC WITH DIFFERENTIAL - Abnormal; Notable for the following:    WBC 11.9 (*)    RBC 3.66 (*)    Hemoglobin 9.7 (*)    HCT 28.8 (*)    All other components within normal limits  BASIC METABOLIC PANEL - Abnormal; Notable for the following:    Creatinine, Ser 1.42 (*)    GFR calc non Af Amer 36 (*)    GFR calc Af Amer 42 (*)    All other components within normal limits    Imaging Review Ct Soft Tissue Neck W  Contrast  04/02/2014   CLINICAL DATA:  Facial swelling  EXAM: CT NECK WITH CONTRAST  TECHNIQUE: Multidetector CT imaging of the neck was performed using the standard protocol following the bolus administration of intravenous contrast.  CONTRAST:  57mL OMNIPAQUE IOHEXOL 300 MG/ML  SOLN  COMPARISON:  None.  FINDINGS: [place] is present anterior to the right superior alveolar ridge and over the right side of the mandible. There is lucency surrounding the root of a right lower molar. See image 52 of series 3. There is no associated overlying abscess. There is no evidence of abscess in the floor of the mouth. Caries are suspected within some of the teeth.  There is mucus filling of the left maxillary sinus. Right  mastoid air cells are clear. There is some fluid in the left mastoid air cells. Right maxillary sinus is clear. There is some mucous material within the ethmoid air cells. Visualized frontal sinuses are clear. Mucous material and mucosal thickening in the bilateral sphenoid sinuses.  Patent airway.  Small hypodensity in the left thyroid gland on image 80.  Lung apices are grossly clear.  Vasculature is grossly patent.  The left globe appears a trophic.  Degenerative disc disease at C5-6.  IMPRESSION: Cellulitis over the right mandible and superior alveolar ridge associated with periodontal disease. No definite abscess.  Inflammatory changes in the paranasal sinuses.   Electronically Signed   By: Maryclare Bean M.D.   On: 04/02/2014 13:00     EKG Interpretation None     Medications  0.9 %  sodium chloride infusion ( Intravenous New Bag/Given 04/02/14 1255)  ondansetron (ZOFRAN) injection 4 mg (4 mg Intravenous Not Given 04/02/14 1225)  sodium chloride 0.9 % bolus 250 mL (0 mLs Intravenous Stopped 04/02/14 1255)  iohexol (OMNIPAQUE) 300 MG/ML solution 80 mL (80 mLs Intravenous Contrast Given 04/02/14 1243)  clindamycin (CLEOCIN) IVPB 300 mg (300 mg Intravenous New Bag/Given 04/02/14 1403)  clindamycin  (CLEOCIN) 300 HY/07PX IVPB (  Duplicate 10/12/24 9485)    MDM   Final diagnoses:  Right facial swelling  Periodontal disease   CT consistent with periodontal disease some cellulitis and no definitive abscess. Treated with clindamycin IV here will be continued orally at the nursing facility. Followup with Simona Huh will be important. Patient nontoxic no acute distress. Mild leukocytosis.  I personally performed the services described in this documentation, which was scribed in my presence. The recorded information has been reviewed and is accurate.     Fredia Sorrow, MD 04/02/14 [phone]

## 2014-04-02 NOTE — ED Notes (Signed)
Called [facility]. They are not a 100% sure, but think she does see Dr Neldon Labella in town for Dental care. Will ask family if they arrive in ED during her stay.

## 2014-04-02 NOTE — ED Notes (Addendum)
Report given to Dondra Spry RN at [facility], advised that staff at [facility] would come to transport pt,

## 2014-04-02 NOTE — Discharge Instructions (Signed)
CT scan showed. Odontoid disease no definitive abscess with some skin cellulitis. Continue antibiotics. Patient given clindamycin here and will continue that orally. Would make appointment with the dentist for further evaluation.

## 2014-04-22 ENCOUNTER — Other Ambulatory Visit: Payer: Self-pay | Admitting: Family Medicine

## 2014-05-02 ENCOUNTER — Other Ambulatory Visit: Payer: Self-pay | Admitting: Family Medicine

## 2014-05-18 ENCOUNTER — Telehealth: Payer: Self-pay | Admitting: *Deleted

## 2014-05-18 NOTE — Telephone Encounter (Signed)
[facility] requesting FL2 and care plan. Please call Lupita Leash when ready to pick up. [phone]

## 2014-05-18 NOTE — Telephone Encounter (Signed)
I believe I signed off on this in the chart left my desk. Feel free to search for it.

## 2014-05-19 NOTE — Telephone Encounter (Signed)
Lupita Leash notified forms ready for pickup.

## 2014-06-06 ENCOUNTER — Encounter: Payer: Self-pay | Admitting: Nurse Practitioner

## 2014-06-06 ENCOUNTER — Ambulatory Visit (INDEPENDENT_AMBULATORY_CARE_PROVIDER_SITE_OTHER): Payer: Medicare Other | Admitting: Nurse Practitioner

## 2014-06-06 VITALS — BP 100/60 | Ht 65.0 in | Wt 94.0 lb

## 2014-06-06 DIAGNOSIS — R82998 Other abnormal findings in urine: Secondary | ICD-10-CM

## 2014-06-06 DIAGNOSIS — R8290 Unspecified abnormal findings in urine: Secondary | ICD-10-CM

## 2014-06-06 MED ORDER — CEFPROZIL 500 MG PO TABS: 500.0000 mg | ORAL_TABLET | Freq: Two times a day (BID) | ORAL | Status: DC

## 2014-06-08 ENCOUNTER — Encounter: Payer: Self-pay | Admitting: Nurse Practitioner

## 2014-06-08 NOTE — Progress Notes (Signed)
Subjective:  Presents with her caregiver from her residential center for complaints of a strong odor to her urine for the past 2 weeks. A family member is also present today. No fever. No vomiting or diarrhea. Has chronic urinary incontinence. Has dementia. No change in her mentation or behavior. No obvious pain. Blood sugar was 81 this morning.  Objective:   BP 100/60  Ht 5\' 5"  (1.651 m)  Wt 94 lb (42.638 kg)  BMI 15.64 kg/m2 NAD. Alert, cooperative. Lungs clear. Heart regular rate rhythm. No obvious CVA or flank tenderness. Abdomen soft nondistended without obvious tenderness. Exam limited, patient sitting in a wheelchair.  Assessment: Abnormal urine odor  Plan:  Meds ordered this encounter  Medications  . cefPROZIL (CEFZIL) 500 MG tablet    Sig: Take 1 tablet (500 mg total) by mouth 2 (two) times daily.    Dispense:  14 tablet    Refill:  0    Order Specific Question:  Supervising Provider    Answer:  [[id number]]   Given a bag for urine sample. Try to obtain sample and bring to office. Warning signs reviewed. Call back if worsens or persists. Recommend starting antibiotic later today.

## 2014-07-04 ENCOUNTER — Telehealth: Payer: Self-pay | Admitting: Family Medicine

## 2014-07-04 NOTE — Telephone Encounter (Signed)
Patient needs form filled out [facility] Transition form)

## 2014-07-06 NOTE — Telephone Encounter (Signed)
Form was completed 

## 2014-07-07 NOTE — Telephone Encounter (Signed)
Faxed to [employer] received.

## 2014-07-13 ENCOUNTER — Ambulatory Visit: Payer: Medicare Other | Admitting: Family Medicine

## 2014-07-28 ENCOUNTER — Encounter: Payer: Self-pay | Admitting: Family Medicine

## 2014-07-28 ENCOUNTER — Ambulatory Visit (INDEPENDENT_AMBULATORY_CARE_PROVIDER_SITE_OTHER): Payer: Medicare Other | Admitting: Family Medicine

## 2014-07-28 VITALS — BP 138/84 | Ht 65.0 in | Wt 100.0 lb

## 2014-07-28 DIAGNOSIS — E785 Hyperlipidemia, unspecified: Secondary | ICD-10-CM

## 2014-07-28 DIAGNOSIS — E119 Type 2 diabetes mellitus without complications: Secondary | ICD-10-CM

## 2014-07-28 DIAGNOSIS — F0280 Dementia in other diseases classified elsewhere without behavioral disturbance: Secondary | ICD-10-CM

## 2014-07-28 DIAGNOSIS — Z7901 Long term (current) use of anticoagulants: Secondary | ICD-10-CM

## 2014-07-28 DIAGNOSIS — Z23 Encounter for immunization: Secondary | ICD-10-CM

## 2014-07-28 DIAGNOSIS — E038 Other specified hypothyroidism: Secondary | ICD-10-CM

## 2014-07-28 DIAGNOSIS — G309 Alzheimer's disease, unspecified: Secondary | ICD-10-CM

## 2014-07-28 DIAGNOSIS — D509 Iron deficiency anemia, unspecified: Secondary | ICD-10-CM

## 2014-07-28 LAB — POCT GLYCOSYLATED HEMOGLOBIN (HGB A1C): Hemoglobin A1C: 7.3

## 2014-07-28 NOTE — Progress Notes (Signed)
   Subjective:    Patient ID: Caitlin Riddle, female    DOB: [birth year], 73 y.o.   MRN: [id number]  Diabetes She presents for her follow-up diabetic visit. She has type 2 diabetes mellitus. There are no hypoglycemic associated symptoms. Pertinent negatives for hypoglycemia include no confusion. There are no diabetic associated symptoms. Pertinent negatives for diabetes include no chest pain, no fatigue, no polydipsia, no polyphagia and no weakness. Symptoms are stable. Her weight is increasing steadily. She monitors blood glucose at home 1-2 x per day. Blood glucose monitoring compliance is good. There is no change in her home blood glucose trend. Her overall blood glucose range is 90-110 mg/dl.   Had flu vaccine last Wed. I reviewed over how the patient was doing at her assisted living she is using a walker to get around overall her health is hanging in there no complications. Her pain level with her arthritis is stable. 25 minutes spent with patient reviewing her overall care plus also her current health problems Review of Systems  Constitutional: Negative for activity change, appetite change and fatigue.  Respiratory: Negative for cough.   Cardiovascular: Negative for chest pain.  Gastrointestinal: Negative for abdominal pain.  Endocrine: Negative for polydipsia and polyphagia.  Genitourinary: Negative for frequency.  Neurological: Negative for weakness.  Psychiatric/Behavioral: Negative for confusion.       Objective:   Physical Exam  Vitals reviewed. Constitutional: She appears well-nourished. No distress.  Cardiovascular: Normal rate, regular rhythm and normal heart sounds.   No murmur heard. Pulmonary/Chest: Effort normal and breath sounds normal. No respiratory distress.  Musculoskeletal: She exhibits no edema.  Lymphadenopathy:    She has no cervical adenopathy.  Neurological: She is alert. She exhibits normal muscle tone.  Psychiatric: Her behavior is normal.          Assessment & Plan:  #1 dementia patient gradually progressing continue current medication #2 diabetes good control watch diet encourage liquid intake check lab work #3 hyperlipidemia continue current medication check lab work #4 hypothyroidism continue current medication check lab work Pneumonia vaccine given.

## 2014-07-31 ENCOUNTER — Emergency Department (HOSPITAL_COMMUNITY)
Admission: EM | Admit: 2014-07-31 | Discharge: 2014-07-31 | Disposition: A | Discharge status: Home / Self Care | Payer: Medicare Other | Attending: Emergency Medicine | Admitting: Emergency Medicine

## 2014-07-31 ENCOUNTER — Encounter (HOSPITAL_COMMUNITY): Payer: Self-pay | Admitting: Emergency Medicine

## 2014-07-31 ENCOUNTER — Emergency Department (HOSPITAL_COMMUNITY): Payer: Medicare Other

## 2014-07-31 DIAGNOSIS — W01198A Fall on same level from slipping, tripping and stumbling with subsequent striking against other object, initial encounter: Secondary | ICD-10-CM | POA: Diagnosis not present

## 2014-07-31 DIAGNOSIS — H5462 Unqualified visual loss, left eye, normal vision right eye: Secondary | ICD-10-CM | POA: Insufficient documentation

## 2014-07-31 DIAGNOSIS — Y92009 Unspecified place in unspecified non-institutional (private) residence as the place of occurrence of the external cause: Secondary | ICD-10-CM | POA: Diagnosis not present

## 2014-07-31 DIAGNOSIS — S0003XA Contusion of scalp, initial encounter: Secondary | ICD-10-CM | POA: Insufficient documentation

## 2014-07-31 DIAGNOSIS — Z79899 Other long term (current) drug therapy: Secondary | ICD-10-CM | POA: Diagnosis not present

## 2014-07-31 DIAGNOSIS — S0990XA Unspecified injury of head, initial encounter: Secondary | ICD-10-CM | POA: Diagnosis present

## 2014-07-31 DIAGNOSIS — E785 Hyperlipidemia, unspecified: Secondary | ICD-10-CM | POA: Insufficient documentation

## 2014-07-31 DIAGNOSIS — I10 Essential (primary) hypertension: Secondary | ICD-10-CM | POA: Diagnosis not present

## 2014-07-31 DIAGNOSIS — F0390 Unspecified dementia without behavioral disturbance: Secondary | ICD-10-CM | POA: Insufficient documentation

## 2014-07-31 DIAGNOSIS — Y9301 Activity, walking, marching and hiking: Secondary | ICD-10-CM | POA: Diagnosis not present

## 2014-07-31 DIAGNOSIS — Z8739 Personal history of other diseases of the musculoskeletal system and connective tissue: Secondary | ICD-10-CM | POA: Diagnosis not present

## 2014-07-31 DIAGNOSIS — E119 Type 2 diabetes mellitus without complications: Secondary | ICD-10-CM | POA: Diagnosis not present

## 2014-07-31 DIAGNOSIS — Z87448 Personal history of other diseases of urinary system: Secondary | ICD-10-CM | POA: Insufficient documentation

## 2014-07-31 DIAGNOSIS — W19XXXA Unspecified fall, initial encounter: Secondary | ICD-10-CM

## 2014-07-31 NOTE — Discharge Instructions (Signed)
°Emergency Department Resource Guide °1) Find a Doctor and Pay Out of Pocket °Although you won't have to find out who is covered by your insurance plan, it is a good idea to ask around and get recommendations. You will then need to call the office and see if the doctor you have chosen will accept you as a new patient and what types of options they offer for patients who are self-pay. Some doctors offer discounts or will set up payment plans for their patients who do not have insurance, but you will need to ask so you aren't surprised when you get to your appointment. ° °2) Contact Your Local Health Department °Not all health departments have doctors that can see patients for sick visits, but many do, so it is worth a call to see if yours does. If you don't know where your local health department is, you can check in your phone book. The CDC also has a tool to help you locate your state's health department, and many state websites also have listings of all of their local health departments. ° °3) Find a Walk-in Clinic °If your illness is not likely to be very severe or complicated, you may want to try a walk in clinic. These are popping up all over the country in pharmacies, drugstores, and shopping centers. They're usually staffed by nurse practitioners or physician assistants that have been trained to treat common illnesses and complaints. They're usually fairly quick and inexpensive. However, if you have serious medical issues or chronic medical problems, these are probably not your best option. ° °No Primary Care Doctor: °- Call [facility] at  832-8000 - they can help you locate a primary care doctor that  accepts your insurance, provides certain services, etc. °- Physician Referral Service- 1-800-533-3463 ° °Chronic Pain Problems: °Organization         Address  Phone   Notes  °[facility]  (336) 297-2271 Patients need to be referred by their primary care doctor.  ° °Medication  Assistance: °Organization         Address  Phone   Notes  °[place] [facility] [address] °[place], [place] [address] (336) 641-8030 --Must be a resident of [place] °-- Must have NO insurance coverage whatsoever (no Medicaid/ Medicare, etc.) °-- The pt. MUST have a primary care doctor that directs their care regularly and follows them in the community °  °[facility]  (866) 331-1348   °[employer]  (888) 892-1162   ° °Agencies that provide inexpensive medical care: °Organization         Address  Phone   Notes  °[facility]  (336) 832-8035   °[facility]    (336) 832-7272   °[facility] [address] °[place], [place] [address] (336) 832-4777   °[facility] [place] [address], °[place] (336) 271-4999   °[facility]    (336) 373-0678   °[facility]    (336) 272-1050   °[facility] ° [address], [place] Phone:  (336) 832-4444, Fax:  (336) 832-4440 Hours of Operation:  9 am - 6 pm, M-F.  Also accepts Medicaid/Medicare and self-pay.  °[facility] ° [address], [place] Phone: (336) 832-3150, Fax: (336) 832-3151. Hours of Operation:  8:30 am - 5:30 pm, M-F.  Also accepts Medicaid and self-pay.  °[facility] [place] [address], [place] Phone: (336) 878-6027   °[facility] [address], [place], [place] (336)723-1848, Ext. 123 Mondays & Thursdays: 7-9 AM.  First 15 patients are seen on a first come, first serve basis. °  ° °Medicaid-accepting [place] Providers: ° °Organization         Address  Phone   Notes  °[facility] [address], [place] (336) 641-2100 Also accepts self-pay patients.  °[facility] [address], [place] ° (336) 856-9996   °[facility] [address], [place]  (336) 288-8857   °[facility] [address] [place] [address], [place] (336) 299-7000   °Veita Bland [address], [place]  ° (336) 373-1557 Only accepts [place] [facility] Medicaid patients after they have their name applied to their card.  ° °Self-Pay (no insurance) in [place]: ° °Organization         Address  Phone   Notes  °Sickle Cell Patients, [facility] [address], [place] (336) 832-1970   °[facility] [address], [place] (336) 832-4400   °[facility] ° [address], [place] (336) 992-4800   °[facility]/Dr. Osei-Bonsu ° [address] [place] [address], [place] or [address], [place] (336) 841-8500 Phone number for both [place] and [place] locations is the same.  °[facility] [address], [place] (336) 299-0000   °[facility] [address] [place] [address], [place] or [address] (336) 852-7530 °(336) 878-2260   °[facility] [address],  (336) 350-1642, phone; (336) 294-5005, fax Sees patients 1st and 3rd Saturday of every month.  Must not qualify for public or private insurance (i.e. Medicaid, Medicare, [place] Health Choice, Veterans' Benefits) • Household income should be no more than 200% of the poverty level •The clinic cannot treat you if you are pregnant or think you are pregnant • Sexually transmitted diseases are not treated at the clinic.  ° ° °Dental Care: °Organization         Address  Phone  Notes  °[place] [facility] [address],  (336) 641-6152 Accepts children up to age 21 who are enrolled in Medicaid or [place] Health Choice; pregnant women with a Medicaid card; and children who have applied for Medicaid or [place] Health Choice, but were declined, whose parents can pay a reduced fee at time of service.  °[place]  [facility] [place]  [address], [place] (336) 641-7733 Accepts children up to age 21 who are enrolled in Medicaid or [place] Health Choice; pregnant women with a Medicaid card; and children who have applied for Medicaid or [place] Health Choice, but were declined, whose parents can pay a reduced fee at time of service.  °[facility] ° [address],  (336) 641-4533 Patients are seen by appointment only. Walk-ins are not accepted. [facility] will see patients 18 years of age and older. °Monday - Tuesday (8am-5pm) °Most Wednesdays (8:30-5pm) °$30 per visit, cash only  °[facility] ° [address], [place] (336) 641-4533 Patients are seen by appointment only. Walk-ins are not accepted. [facility] will see patients 18 years of age and older. °One   Wednesday Evening (Monthly: Volunteer Based).  $30 per visit, cash only  °[facility]  (919) 537-3737 for adults; Children under age 4, call Graduate Pediatric Dentistry at (919) [phone]. Children aged 4-14, please call (919) [phone] to request a pediatric application. ° Dental services are provided in all areas of dental care including fillings, crowns and bridges, complete and partial dentures, implants, gum treatment, root canals, and extractions. Preventive care is also provided. Treatment is provided to both adults and children. °Patients are selected via a lottery and there is often a waiting list. °  °[facility] [address], °[place] ° (336) 763-8833 [email or web address] °  °[facility] [address], [place], [place] (336)723-1848, Ext. 123 Second and Fourth Thursday of each month, opens at 6:30 AM; Clinic ends at 9 AM.  Patients are seen on a first-come first-served basis, and a limited number are seen during each clinic.  ° °[facility] ° [address], [place], [place] (336) 723-7904    Eligibility Requirements °You must have lived in [place], [place], or [place] counties for at least the last three months. °  You cannot be eligible for state or federal sponsored healthcare insurance, including Veterans Administration, Medicaid, or Medicare. °  You generally cannot be eligible for healthcare insurance through your employer.  °  How to apply: °Eligibility screenings are held every Tuesday and Wednesday afternoon from 1:00 pm until 4:00 pm. You do not need an appointment for the interview!  °[facility] [address], [place], [place] 336-631-2330   °[facility]  336-342-8273   °[facility]  336-703-3100   °[facility]  336-570-6415   ° °Behavioral Health Resources in the Community: °Intensive Outpatient Programs °Organization         Address  Phone  Notes  °[facility] [address], [place], [place] 336-878-6098   °[facility] [address], [place], [place] 336-832-9800   °[facility] [address], [place], [place] ° 336-882-2125   °[facility] [address],  °[place], [place] 1-800-853-5163 or 336-641-4981   °Substance Abuse Resources °Organization         Address  Phone  Notes  °[facility]  336-882-2125   °[facility]  336-784-9470   °[facility]  336-285-9073   °[facility]  336-845-3988   °Residential & Outpatient Substance Abuse Program  1-800-659-3381   °Psychological Services °Organization         Address  Phone  Notes  ° Health  336- 832-9600   °[facility]  336- 378-7881   °[facility] [address], [place] 1-800-853-5163 or 336-641-4981   ° °Mobile Crisis Teams °Organization         Address  Phone  Notes  °[facility], Mobile Crisis Care Unit  1-877-626-1772   °Assertive °[facility] ° [address]  [place], [place] 336-834-9664   °Sharon DeEsch [address] °[place] 336-554-5454   ° °Self-Help/Support Groups °Organization         Address  Phone             Notes  °[facility] [place] - variety of support groups  336- 373-1402 Call for more information  °Narcotics Anonymous (NA), [facility] [address], °[place]   2 meetings at this location  ° °  Residential Treatment Programs °Organization         Address  Phone  Notes  °[facility] [address],    °[place]  1-866-801-8205   °[facility] ° [address], [place], [place] 704-293-8524   °[facility] [address], [place] 336-845-3988 Admissions: 8am-3pm M-F  °[facility] [address]    °[place], [place] 336-841-1104   °[facility] [address], [place], [place] 336-379-7146   °[facility] [address]  °[place], [place] 336-285-9073   °[facility] Intensive Outpatient [address], [place], [place] 336-852-3033   °[facility] [address]  °[place], [place] 1-877-615-2722 or 336-784-9470   °[facility] [address] [place], [place] 336-227-7417 Accepts Medicaid  °[facility] [address]  ° [place] 1-800-659-3381 Substance Abuse/Addiction Treatment  ° °[place] Behavioral Health Resources °Organization         Address  Phone  Notes  °[facility]  (888) 581-9988   °Julie Brannon, PhD [address] [place], [place]   (336) 349-5553 or (336) 951-0000   °[facility]   [address] °[place], [place] (336) 349-4454   °[facility] [address], [place], [place] (336) 342-8316 Insurance/Medicaid/sponsorship through [facility]  °[facility] [address]                                    [place], [place] (336) 342-8316 Therapy/tele-psych/case    °[facility] [address]  ° [place], [place] (336) 349-2233    °Dr. Arfeen  (336) 349-4544   °[facility] [place]  [employer] [place] [facility] 1) [address], [place] °2) [address], [place] °3)  [address], [place] (336) 349-3220 °(336) 342-7768 ° °(336) 342-8140   °[place] [facility] (336) 342-1394 or (336) 342-3537 (After Hours)    ° ° °Take your usual prescriptions as previously directed.  Apply moist heat or ice to the area(s) of discomfort, for 15 minutes at a time, several times per day for the next few days.  Do not fall asleep on a heating or ice pack.  Call your regular medical doctor tomorrow to schedule a follow up appointment in the next 2 days.  Return to the Emergency Department immediately if worsening. ° °

## 2014-07-31 NOTE — ED Notes (Signed)
[facility] notifed of pt discharge and arrived to take patient back to care facility. Discharge paper work given to [employer] and reviewed.

## 2014-07-31 NOTE — ED Notes (Signed)
Pt has been discharged by EDP, family made aware. Attempted to call [facility] x2 with no response. Will attempt again in 10 minutes, to call report and arrange transport for patient

## 2014-07-31 NOTE — ED Notes (Signed)
Pt fell backwards at home and has small bump to back of head per EMS. No loss LOC.

## 2014-07-31 NOTE — ED Provider Notes (Signed)
CSN: [id number]     Arrival date & time 07/31/14  1932 History   First MD Initiated Contact with Patient 07/31/14 1950     Chief Complaint  Patient presents with  . Fall      Patient is a 73 y.o. female presenting with fall. The history is provided by the patient, a relative and the EMS personnel. The history is limited by the condition of the patient (Hx dementia).  Fall  Pt was seen at 2010. Per EMS, NH report, pt's family and pt: c/o sudden onset and resolution of one episode of slip and fall while walking down the hallway PTA. Pt fell to the floor, hitting the back of her head. No reported LOC, no AMS from baseline. Pt has a significant hx of dementia and currently denies any complaints. Denies neck or back pain, no CP/SOB, no abd pain, no focal motor weakness.     Past Medical History  Diagnosis Date  . Diabetes mellitus   . Hypertension   . Arthritis   . Hyperlipidemia   . Vision loss of left eye   . Arthralgia   . Renal insufficiency   . Dementia    Past Surgical History  Procedure Laterality Date  . Total abdominal hysterectomy    . Colonoscopy    . Esophagogastroduodenoscopy      History  Substance Use Topics  . Smoking status: Never Smoker   . Smokeless tobacco: Never Used  . Alcohol Use: No    Review of Systems  Unable to perform ROS: Dementia      Allergies  Review of patient's allergies indicates no known allergies.  Home Medications   Prior to Admission medications   Medication Sig Start Date End Date Taking? Authorizing Provider  cadexomer iodine (IODOSORB) 0.9 % gel Apply 1 application topically every 3 (three) days.   Yes Historical Provider, MD  collagenase (SANTYL) ointment Apply 1 application topically daily as needed.   Yes Historical Provider, MD  levothyroxine (SYNTHROID, LEVOTHROID) 25 MCG tablet Take 1 tablet (25 mcg total) by mouth daily before breakfast. 12/28/13  Yes Babs Sciara, MD  memantine (NAMENDA) 5 MG tablet Take 1 tablet  (5 mg total) by mouth 2 (two) times daily. 11/29/13  Yes Babs Sciara, MD  metFORMIN (GLUCOPHAGE) 500 MG tablet Take 250 mg by mouth 3 (three) times daily with meals.   Yes Historical Provider, MD  Nutritional Supplements (CARNATION INST BREAKFAST JUICE) LIQD Take 1 Bottle by mouth daily.   Yes Historical Provider, MD  pravastatin (PRAVACHOL) 20 MG tablet Take 20 mg by mouth daily.   Yes Historical Provider, MD  Propylene Glycol (SYSTANE BALANCE OP) Apply 1 drop to eye 3 (three) times daily.   Yes Historical Provider, MD  traMADol (ULTRAM) 50 MG tablet Take 50 mg by mouth every 6 (six) hours as needed.   Yes Historical Provider, MD  promethazine (PHENERGAN) 25 MG tablet Take 1 tablet (25 mg total) by mouth every 6 (six) hours as needed for nausea or vomiting. 10/05/13   Donnetta Hutching, MD  Zinc Oxide (DESITIN CREAMY EX) Apply 1 application topically daily as needed (after bowel movement.).    Historical Provider, MD   BP 151/79  Pulse 110  Temp(Src) 99 F (37.2 C) (Oral)  Resp 16  SpO2 100% Physical Exam 2015: Physical examination: Vital signs and O2 SAT: Reviewed; Constitutional: Well developed, Well nourished, Well hydrated, In no acute distress; Head and Face: Normocephalic, +contusion posterior scalp. No open wounds.; Eyes:  EOMI, PERRL, No scleral icterus; ENMT: Mouth and pharynx normal, Left TM normal, Right TM normal, Mucous membranes moist; Neck: Supple, Trachea midline; Spine: No midline CS, TS, LS tenderness.; Cardiovascular: Regular rate and rhythm, No gallop; Respiratory: Breath sounds clear & equal bilaterally, No wheezes, Normal respiratory effort/excursion; Chest: Nontender, No deformity, Movement normal, No crepitus, No abrasions or ecchymosis.; Abdomen: Soft, Nontender, Nondistended, Normal bowel sounds, No abrasions or ecchymosis.; Genitourinary: No CVA tenderness;; Extremities: No deformity, Full range of motion major/large joints of bilat UE's and LE's without pain or tenderness to  palp, Neurovascularly intact, Pulses normal, No tenderness, No edema, Pelvis stable; Neuro: Awake, alert, confused re: time, place, events per hx dementia. +left eye vision loss per baseline, otherwise major CN grossly intact. No facial droop. Speech clear. Moves all extremities spontaneously and to command without apparent gross focal motor deficits.; Skin: Color normal, Warm, Dry   ED Course  Procedures    MDM  MDM Reviewed: previous chart, nursing note and vitals Interpretation: CT scan    Ct Head Wo Contrast 07/31/2014   CLINICAL DATA:  Fall backwards with head swelling. Dementia. Initial encounter  EXAM: CT HEAD WITHOUT CONTRAST  CT CERVICAL SPINE WITHOUT CONTRAST  TECHNIQUE: Multidetector CT imaging of the head and cervical spine was performed following the standard protocol without intravenous contrast. Multiplanar CT image reconstructions of the cervical spine were also generated.  COMPARISON:  01/17/2014  FINDINGS: CT HEAD FINDINGS  Skull and Sinuses:No acute fracture or destructive process.  There is chronic opacification of right sphenoid and posterior ethmoid air cells with neo osteogenesis and mineralized contents. Findings consistent with chronic sinusitis.  Orbits: Chronically small left globe.  No acute traumatic findings.  Brain: No evidence of acute abnormality, such as acute infarction, hemorrhage, hydrocephalus, or mass lesion/mass effect. There is brain atrophy with ex vacuo ventricular enlargement that is unchanged from previous. Chronic small-vessel disease with ischemic gliosis confluent around the lateral ventricles.  CT CERVICAL SPINE FINDINGS  Negative for acute fracture or subluxation. There is reversal cervical lordosis which was also seen previously and is likely positional. Focal degenerative disc narrowing at C5-6 without significant visible canal or foraminal stenosis. No prevertebral swelling or gross cervical canal hematoma.  Secretions layering within the left  maxillary sinus.  IMPRESSION: 1. No evidence of acute intracranial or cervical spine injury. 2. Chronic/senescent changes are described above.   Electronically Signed   By: Tiburcio Pea M.D.   On: 07/31/2014 20:41   Ct Cervical Spine Wo Contrast 07/31/2014   CLINICAL DATA:  Fall backwards with head swelling. Dementia. Initial encounter  EXAM: CT HEAD WITHOUT CONTRAST  CT CERVICAL SPINE WITHOUT CONTRAST  TECHNIQUE: Multidetector CT imaging of the head and cervical spine was performed following the standard protocol without intravenous contrast. Multiplanar CT image reconstructions of the cervical spine were also generated.  COMPARISON:  01/17/2014  FINDINGS: CT HEAD FINDINGS  Skull and Sinuses:No acute fracture or destructive process.  There is chronic opacification of right sphenoid and posterior ethmoid air cells with neo osteogenesis and mineralized contents. Findings consistent with chronic sinusitis.  Orbits: Chronically small left globe.  No acute traumatic findings.  Brain: No evidence of acute abnormality, such as acute infarction, hemorrhage, hydrocephalus, or mass lesion/mass effect. There is brain atrophy with ex vacuo ventricular enlargement that is unchanged from previous. Chronic small-vessel disease with ischemic gliosis confluent around the lateral ventricles.  CT CERVICAL SPINE FINDINGS  Negative for acute fracture or subluxation. There is reversal cervical lordosis which  was also seen previously and is likely positional. Focal degenerative disc narrowing at C5-6 without significant visible canal or foraminal stenosis. No prevertebral swelling or gross cervical canal hematoma.  Secretions layering within the left maxillary sinus.  IMPRESSION: 1. No evidence of acute intracranial or cervical spine injury. 2. Chronic/senescent changes are described above.   Electronically Signed   By: Tiburcio Pea M.D.   On: 07/31/2014 20:41    2100:  CT without acute injury. Dx and testing d/w pt and  family.  Questions answered.  Verb understanding, agreeable to d/c back to nursing home with outpt f/u.   Samuel Jester, DO 08/02/14 1413

## 2014-08-05 ENCOUNTER — Other Ambulatory Visit: Payer: Self-pay | Admitting: Family Medicine

## 2014-08-05 NOTE — Telephone Encounter (Signed)
Last seen 07-28-14

## 2014-08-11 ENCOUNTER — Ambulatory Visit (INDEPENDENT_AMBULATORY_CARE_PROVIDER_SITE_OTHER): Payer: Medicare Other | Admitting: Family Medicine

## 2014-08-11 ENCOUNTER — Encounter: Payer: Self-pay | Admitting: Family Medicine

## 2014-08-11 VITALS — BP 118/72 | Ht 65.0 in | Wt 95.0 lb

## 2014-08-11 DIAGNOSIS — M8580 Other specified disorders of bone density and structure, unspecified site: Secondary | ICD-10-CM

## 2014-08-11 DIAGNOSIS — S0093XD Contusion of unspecified part of head, subsequent encounter: Secondary | ICD-10-CM

## 2014-08-11 NOTE — Progress Notes (Signed)
   Subjective:    Patient ID: Caitlin Riddle, female    DOB: [birth year], 73 y.o.   MRN: [id number]  HPI Patient is here today for ER f/u from 10/25. The complete note from the ER was reviewed along with the x-ray results in addition to this I talked at length with the caretaker apparently the patient fell in the hallway she had a scan of the head and the neck. She overall feels good currently. She fell.  Pt says she feels fine and is not in any pain.  Patient is due for bone density   Review of Systems Patient is too confused to give this     Objective:   Physical Exam  Eardrums normal throat is normal neck supple lungs clear heart regular extremities no edema patient is her usual pleasant self does not appear to be different  Patient was observed walking with her walker and was able, stable, no apparent problems    Assessment & Plan:  Fall with contusions recovering no interventions necessary  Osteopenia check bone density  Keep regular follow-up.

## 2014-08-15 ENCOUNTER — Other Ambulatory Visit: Payer: Self-pay | Admitting: Family Medicine

## 2014-08-16 ENCOUNTER — Other Ambulatory Visit (HOSPITAL_COMMUNITY): Payer: Medicare Other

## 2014-08-16 NOTE — Telephone Encounter (Signed)
May have this +5 refills 

## 2014-08-19 ENCOUNTER — Ambulatory Visit (HOSPITAL_COMMUNITY)
Admission: RE | Admit: 2014-08-19 | Discharge: 2014-08-19 | Disposition: A | Discharge status: Home / Self Care | Payer: Medicare Other | Source: Ambulatory Visit | Attending: Family Medicine | Admitting: Family Medicine

## 2014-08-19 DIAGNOSIS — Z87310 Personal history of (healed) osteoporosis fracture: Secondary | ICD-10-CM | POA: Diagnosis not present

## 2014-08-19 DIAGNOSIS — Z90722 Acquired absence of ovaries, bilateral: Secondary | ICD-10-CM | POA: Insufficient documentation

## 2014-08-19 DIAGNOSIS — M8580 Other specified disorders of bone density and structure, unspecified site: Secondary | ICD-10-CM

## 2014-08-19 DIAGNOSIS — R29890 Loss of height: Secondary | ICD-10-CM | POA: Diagnosis not present

## 2014-08-19 DIAGNOSIS — M818 Other osteoporosis without current pathological fracture: Secondary | ICD-10-CM | POA: Insufficient documentation

## 2014-08-24 LAB — HEPATIC FUNCTION PANEL
ALT: <8 U/L (ref 0–35)
AST: 14 U/L (ref 0–37)
Albumin: 3.7 g/dL (ref 3.5–5.2)
Alkaline Phosphatase: 147 U/L — ABNORMAL HIGH (ref 39–117)
BILIRUBIN TOTAL: 0.2 mg/dL (ref 0.2–1.2)
Total Protein: 7 g/dL (ref 6.0–8.3)

## 2014-08-24 LAB — LIPID PANEL
CHOL/HDL RATIO: 3.4 ratio
CHOLESTEROL: 158 mg/dL (ref 0–200)
HDL: 46 mg/dL (ref >39)
LDL CALC: 94 mg/dL (ref 0–99)
Triglycerides: 90 mg/dL (ref <150)
VLDL: 18 mg/dL (ref 0–40)

## 2014-08-24 LAB — BASIC METABOLIC PANEL
BUN: 17 mg/dL (ref 6–23)
CHLORIDE: 111 meq/L (ref 96–112)
CO2: 24 mEq/L (ref 19–32)
Calcium: 9.3 mg/dL (ref 8.4–10.5)
Creat: 1.48 mg/dL — ABNORMAL HIGH (ref 0.50–1.10)
GLUCOSE: 97 mg/dL (ref 70–99)
POTASSIUM: 5 meq/L (ref 3.5–5.3)
SODIUM: 144 meq/L (ref 135–145)

## 2014-08-24 LAB — TSH: TSH: 1.863 u[IU]/mL (ref 0.350–4.500)

## 2014-08-29 NOTE — Progress Notes (Signed)
Patient's nurse notified and verbalized understanding.

## 2014-09-07 ENCOUNTER — Ambulatory Visit: Payer: Medicare Other | Admitting: Family Medicine

## 2014-09-14 ENCOUNTER — Ambulatory Visit (INDEPENDENT_AMBULATORY_CARE_PROVIDER_SITE_OTHER): Payer: Medicare Other | Admitting: Family Medicine

## 2014-09-14 ENCOUNTER — Other Ambulatory Visit: Payer: Self-pay | Admitting: *Deleted

## 2014-09-14 DIAGNOSIS — M810 Age-related osteoporosis without current pathological fracture: Secondary | ICD-10-CM

## 2014-09-14 MED ORDER — TRAMADOL HCL 50 MG PO TABS: ORAL_TABLET | ORAL | Status: DC

## 2014-09-14 NOTE — Progress Notes (Signed)
   Subjective:    Patient ID: Caitlin Riddle, female    DOB: 31-May-1941, 73 y.o.   MRN: [id number]  HPI Consultation with doctor about patient 's osteoporosis treatment with Venita Sheffield (sister) and Tiburcio Bash (brother).    Review of Systems     Objective:   Physical Exam  15 minutes was spent with the patient's family discussing osteoporosis treatment options including treatment with the IV therapy, pill therapy or no treatment at all discussion was held regarding the pros and cons of each approach and potential side effects     Assessment & Plan:  Family would like to progress with IV therapy. Patients creatinine is 1.42 I am uncertain if she can successfully received the IV therapy because of her renal insufficiency. Time was spent with the family discussing how renal insufficiency is a common issue normal older individuals and does not necessarily mean sure have to go on dialysis at this time It was also discussed with the family that we are monitoring her condition that she seems to be losing weight because she does not eat as much because of her dementia

## 2014-09-18 ENCOUNTER — Telehealth: Payer: Self-pay | Admitting: Family Medicine

## 2014-09-18 ENCOUNTER — Other Ambulatory Visit: Payer: Self-pay | Admitting: Family Medicine

## 2014-09-18 DIAGNOSIS — E119 Type 2 diabetes mellitus without complications: Secondary | ICD-10-CM

## 2014-09-18 NOTE — Telephone Encounter (Signed)
Nurses, please connect with her rest home. I believe it is [facility]. Stop metformin. Notify [facility] as well. Please delete it from her med list. Have her rest home check her glucoses twice daily both a.m. and at supper time and forward those to [place] in one week. I will delete the medication from her med list

## 2014-09-19 NOTE — Telephone Encounter (Addendum)
Metformin cancelled at [facility] and [facility]. Informed Lupita Leash to check patients sugar as directed and to send readings in one week. Lupita Leash at [facility] verbalized understanding.

## 2014-10-11 ENCOUNTER — Other Ambulatory Visit: Payer: Self-pay | Admitting: Family Medicine

## 2014-10-13 ENCOUNTER — Telehealth: Payer: Self-pay | Admitting: Family Medicine

## 2014-10-13 ENCOUNTER — Other Ambulatory Visit: Payer: Self-pay | Admitting: Family Medicine

## 2014-10-13 DIAGNOSIS — M810 Age-related osteoporosis without current pathological fracture: Secondary | ICD-10-CM

## 2014-10-13 NOTE — Telephone Encounter (Signed)
Please connect with family member. I researched her condition regarding osteoporosis and renal insufficiency. She cannot receive the IV medication because it could cause renal injury. I do recommend vitamin D 800 international units daily. This can be given at her assisted [facility]. Please call the brother Tiburcio Bash [phone]

## 2014-10-14 ENCOUNTER — Telehealth: Payer: Self-pay | Admitting: Family Medicine

## 2014-10-14 NOTE — Telephone Encounter (Signed)
Pt's sister wants someone to call her regarding her sister's iv  Therapy and her appetite.

## 2014-10-14 NOTE — Telephone Encounter (Signed)
Patient's sister notified and verbalized understanding. [facility] notified. Faxed over order to [facility].

## 2014-10-25 ENCOUNTER — Other Ambulatory Visit: Payer: Self-pay | Admitting: Family Medicine

## 2014-10-26 ENCOUNTER — Ambulatory Visit (INDEPENDENT_AMBULATORY_CARE_PROVIDER_SITE_OTHER): Payer: Medicare Other | Admitting: Family Medicine

## 2014-10-26 VITALS — BP 116/72 | Ht 65.0 in

## 2014-10-26 DIAGNOSIS — E119 Type 2 diabetes mellitus without complications: Secondary | ICD-10-CM

## 2014-10-26 DIAGNOSIS — F0280 Dementia in other diseases classified elsewhere without behavioral disturbance: Secondary | ICD-10-CM

## 2014-10-26 DIAGNOSIS — G309 Alzheimer's disease, unspecified: Secondary | ICD-10-CM | POA: Diagnosis not present

## 2014-10-26 DIAGNOSIS — E038 Other specified hypothyroidism: Secondary | ICD-10-CM | POA: Diagnosis not present

## 2014-10-26 DIAGNOSIS — E785 Hyperlipidemia, unspecified: Secondary | ICD-10-CM

## 2014-10-26 DIAGNOSIS — N289 Disorder of kidney and ureter, unspecified: Secondary | ICD-10-CM

## 2014-10-26 DIAGNOSIS — D509 Iron deficiency anemia, unspecified: Secondary | ICD-10-CM

## 2014-10-26 NOTE — Progress Notes (Signed)
   Subjective:    Patient ID: Caitlin Riddle, female    DOB: May 08, 1941, 74 y.o.   MRN: [id number]  HPI  Patient arrives for a follow up on meds. Patient at [facility] and doing well Has been at the care center they are trying to feed her on a regular basis she does not always 1 eat much. No vomiting or diarrhea no bloody stool. Patient does not complain of any pain or discomfort. Review of Systems No vomiting no diarrhea no bloody stool. Appetite has been subpar. Decreasing with time. Alertness is present but patient does not connect does not have good memory has severe Alzheimer's    Objective:   Physical Exam Lungs clear no crackles heart regular pulse normal extremities no edema patient has lost some weight she is very thin it's difficult for her to move she has to be transferred from the bed to the wheelchair. Patient is pleasant makes good eye contact but cannot interact on a meaningful basis unable to answer any questions       Assessment & Plan:  1. Other specified hypothyroidism We will need to check TSH continue current medication - TSH  2. Type 2 diabetes mellitus with HbA1C goal below 7.5 We will relook at the A1c. Recently we have stopped the metformin. - Hemoglobin A1c  3. Alzheimer's disease Severe Alzheimer's. This is only getting get worse with time. Her poor appetite is related to this.  4. Hyperlipidemia History hyperlipidemia will need to check lipid profile later in the spring  5. Iron deficiency anemia History of anemia with poor appetite we will go ahead and recheck CBC she does have rheumatoid arthritis which also makes it a anemia of chronic disease. - CBC with Differential - Ferritin  6. Renal insufficiency We will check to see what her metabolic 7 is. - Basic metabolic panel  Patient also has osteopenia/osteoporosis unable to do IV therapy because of renal insufficiency. Unable to take oral because of her Alzheimer's.

## 2014-11-10 DIAGNOSIS — E038 Other specified hypothyroidism: Secondary | ICD-10-CM | POA: Diagnosis not present

## 2014-11-10 DIAGNOSIS — E119 Type 2 diabetes mellitus without complications: Secondary | ICD-10-CM | POA: Diagnosis not present

## 2014-11-10 DIAGNOSIS — D509 Iron deficiency anemia, unspecified: Secondary | ICD-10-CM | POA: Diagnosis not present

## 2014-11-10 DIAGNOSIS — N289 Disorder of kidney and ureter, unspecified: Secondary | ICD-10-CM | POA: Diagnosis not present

## 2014-11-10 LAB — BASIC METABOLIC PANEL
BUN: 15 mg/dL (ref 6–23)
CHLORIDE: 108 meq/L (ref 96–112)
CO2: 26 meq/L (ref 19–32)
Calcium: 9.9 mg/dL (ref 8.4–10.5)
Creat: 1.58 mg/dL — ABNORMAL HIGH (ref 0.50–1.10)
GLUCOSE: 89 mg/dL (ref 70–99)
Potassium: 5.2 mEq/L (ref 3.5–5.3)
Sodium: 143 mEq/L (ref 135–145)

## 2014-11-10 LAB — CBC WITH DIFFERENTIAL/PLATELET
Basophils Absolute: 0 10*3/uL (ref 0.0–0.1)
Basophils Relative: 0 % (ref 0–1)
Eosinophils Absolute: 0.4 10*3/uL (ref 0.0–0.7)
Eosinophils Relative: 4 % (ref 0–5)
HEMATOCRIT: 35 % — AB (ref 36.0–46.0)
Hemoglobin: 11.2 g/dL — ABNORMAL LOW (ref 12.0–15.0)
LYMPHS PCT: 36 % (ref 12–46)
Lymphs Abs: 3.5 10*3/uL (ref 0.7–4.0)
MCH: 25.8 pg — AB (ref 26.0–34.0)
MCHC: 32 g/dL (ref 30.0–36.0)
MCV: 80.6 fL (ref 78.0–100.0)
MPV: 10.4 fL (ref 8.6–12.4)
Monocytes Absolute: 0.5 10*3/uL (ref 0.1–1.0)
Monocytes Relative: 5 % (ref 3–12)
Neutro Abs: 5.3 10*3/uL (ref 1.7–7.7)
Neutrophils Relative %: 55 % (ref 43–77)
Platelets: 294 10*3/uL (ref 150–400)
RBC: 4.34 MIL/uL (ref 3.87–5.11)
RDW: 15.4 % (ref 11.5–15.5)
WBC: 9.6 10*3/uL (ref 4.0–10.5)

## 2014-11-10 LAB — TSH: TSH: 2.352 u[IU]/mL (ref 0.350–4.500)

## 2014-11-10 LAB — FERRITIN: FERRITIN: 94 ng/mL (ref 10–291)

## 2014-11-10 LAB — HEMOGLOBIN A1C
Hgb A1c MFr Bld: 6.4 % — ABNORMAL HIGH (ref <5.7)
MEAN PLASMA GLUCOSE: 137 mg/dL — AB (ref <117)

## 2014-11-11 ENCOUNTER — Telehealth: Payer: Self-pay | Admitting: *Deleted

## 2014-11-11 NOTE — Progress Notes (Signed)
Patient's rest home notified and verbalized understanding of the test results. No further questions.

## 2014-11-11 NOTE — Telephone Encounter (Signed)
Sorry, the fax # is [phone]

## 2014-11-26 DIAGNOSIS — S82402B Unspecified fracture of shaft of left fibula, initial encounter for open fracture type I or II: Secondary | ICD-10-CM | POA: Diagnosis not present

## 2014-11-26 DIAGNOSIS — E785 Hyperlipidemia, unspecified: Secondary | ICD-10-CM | POA: Diagnosis not present

## 2014-11-26 DIAGNOSIS — F0280 Dementia in other diseases classified elsewhere without behavioral disturbance: Secondary | ICD-10-CM | POA: Diagnosis not present

## 2014-11-26 DIAGNOSIS — R32 Unspecified urinary incontinence: Secondary | ICD-10-CM | POA: Diagnosis not present

## 2014-12-27 DIAGNOSIS — S82402B Unspecified fracture of shaft of left fibula, initial encounter for open fracture type I or II: Secondary | ICD-10-CM | POA: Diagnosis not present

## 2014-12-27 DIAGNOSIS — E785 Hyperlipidemia, unspecified: Secondary | ICD-10-CM | POA: Diagnosis not present

## 2014-12-27 DIAGNOSIS — R32 Unspecified urinary incontinence: Secondary | ICD-10-CM | POA: Diagnosis not present

## 2014-12-27 DIAGNOSIS — F0280 Dementia in other diseases classified elsewhere without behavioral disturbance: Secondary | ICD-10-CM | POA: Diagnosis not present

## 2015-01-10 ENCOUNTER — Emergency Department (HOSPITAL_COMMUNITY): Payer: Medicare Other

## 2015-01-10 ENCOUNTER — Encounter (HOSPITAL_COMMUNITY): Payer: Self-pay | Admitting: Cardiology

## 2015-01-10 ENCOUNTER — Inpatient Hospital Stay (HOSPITAL_COMMUNITY)
Admission: EM | Admit: 2015-01-10 | Discharge: 2015-01-13 | DRG: 482 | Disposition: A | Discharge status: Skilled Nursing Facility | Payer: Medicare Other | Attending: Internal Medicine | Admitting: Internal Medicine

## 2015-01-10 DIAGNOSIS — S72141D Displaced intertrochanteric fracture of right femur, subsequent encounter for closed fracture with routine healing: Secondary | ICD-10-CM | POA: Diagnosis not present

## 2015-01-10 DIAGNOSIS — D509 Iron deficiency anemia, unspecified: Secondary | ICD-10-CM | POA: Diagnosis not present

## 2015-01-10 DIAGNOSIS — T148XXA Other injury of unspecified body region, initial encounter: Secondary | ICD-10-CM

## 2015-01-10 DIAGNOSIS — Z90710 Acquired absence of both cervix and uterus: Secondary | ICD-10-CM | POA: Diagnosis not present

## 2015-01-10 DIAGNOSIS — S72141A Displaced intertrochanteric fracture of right femur, initial encounter for closed fracture: Principal | ICD-10-CM | POA: Diagnosis present

## 2015-01-10 DIAGNOSIS — S72001A Fracture of unspecified part of neck of right femur, initial encounter for closed fracture: Secondary | ICD-10-CM | POA: Diagnosis not present

## 2015-01-10 DIAGNOSIS — E039 Hypothyroidism, unspecified: Secondary | ICD-10-CM | POA: Diagnosis not present

## 2015-01-10 DIAGNOSIS — N183 Chronic kidney disease, stage 3 unspecified: Secondary | ICD-10-CM | POA: Diagnosis present

## 2015-01-10 DIAGNOSIS — I10 Essential (primary) hypertension: Secondary | ICD-10-CM | POA: Diagnosis not present

## 2015-01-10 DIAGNOSIS — M160 Bilateral primary osteoarthritis of hip: Secondary | ICD-10-CM | POA: Diagnosis present

## 2015-01-10 DIAGNOSIS — M069 Rheumatoid arthritis, unspecified: Secondary | ICD-10-CM | POA: Diagnosis not present

## 2015-01-10 DIAGNOSIS — W19XXXA Unspecified fall, initial encounter: Secondary | ICD-10-CM

## 2015-01-10 DIAGNOSIS — R41841 Cognitive communication deficit: Secondary | ICD-10-CM | POA: Diagnosis not present

## 2015-01-10 DIAGNOSIS — Y93E1 Activity, personal bathing and showering: Secondary | ICD-10-CM | POA: Diagnosis not present

## 2015-01-10 DIAGNOSIS — F0280 Dementia in other diseases classified elsewhere without behavioral disturbance: Secondary | ICD-10-CM | POA: Diagnosis not present

## 2015-01-10 DIAGNOSIS — G309 Alzheimer's disease, unspecified: Secondary | ICD-10-CM | POA: Diagnosis present

## 2015-01-10 DIAGNOSIS — E559 Vitamin D deficiency, unspecified: Secondary | ICD-10-CM | POA: Diagnosis not present

## 2015-01-10 DIAGNOSIS — R269 Unspecified abnormalities of gait and mobility: Secondary | ICD-10-CM | POA: Diagnosis not present

## 2015-01-10 DIAGNOSIS — R32 Unspecified urinary incontinence: Secondary | ICD-10-CM | POA: Diagnosis present

## 2015-01-10 DIAGNOSIS — H547 Unspecified visual loss: Secondary | ICD-10-CM | POA: Diagnosis present

## 2015-01-10 DIAGNOSIS — M2550 Pain in unspecified joint: Secondary | ICD-10-CM | POA: Diagnosis not present

## 2015-01-10 DIAGNOSIS — S8991XA Unspecified injury of right lower leg, initial encounter: Secondary | ICD-10-CM | POA: Diagnosis not present

## 2015-01-10 DIAGNOSIS — W19XXXD Unspecified fall, subsequent encounter: Secondary | ICD-10-CM | POA: Diagnosis not present

## 2015-01-10 DIAGNOSIS — E119 Type 2 diabetes mellitus without complications: Secondary | ICD-10-CM

## 2015-01-10 DIAGNOSIS — T1490XA Injury, unspecified, initial encounter: Secondary | ICD-10-CM

## 2015-01-10 DIAGNOSIS — S72001D Fracture of unspecified part of neck of right femur, subsequent encounter for closed fracture with routine healing: Secondary | ICD-10-CM | POA: Diagnosis not present

## 2015-01-10 DIAGNOSIS — Y92121 Bathroom in nursing home as the place of occurrence of the external cause: Secondary | ICD-10-CM

## 2015-01-10 DIAGNOSIS — W182XXA Fall in (into) shower or empty bathtub, initial encounter: Secondary | ICD-10-CM | POA: Diagnosis present

## 2015-01-10 DIAGNOSIS — M25561 Pain in right knee: Secondary | ICD-10-CM | POA: Diagnosis not present

## 2015-01-10 DIAGNOSIS — E785 Hyperlipidemia, unspecified: Secondary | ICD-10-CM | POA: Diagnosis present

## 2015-01-10 DIAGNOSIS — R197 Diarrhea, unspecified: Secondary | ICD-10-CM | POA: Diagnosis not present

## 2015-01-10 DIAGNOSIS — M25551 Pain in right hip: Secondary | ICD-10-CM | POA: Diagnosis present

## 2015-01-10 DIAGNOSIS — I129 Hypertensive chronic kidney disease with stage 1 through stage 4 chronic kidney disease, or unspecified chronic kidney disease: Secondary | ICD-10-CM | POA: Diagnosis present

## 2015-01-10 DIAGNOSIS — M6281 Muscle weakness (generalized): Secondary | ICD-10-CM | POA: Diagnosis not present

## 2015-01-10 DIAGNOSIS — Z9181 History of falling: Secondary | ICD-10-CM | POA: Diagnosis not present

## 2015-01-10 DIAGNOSIS — H5462 Unqualified visual loss, left eye, normal vision right eye: Secondary | ICD-10-CM | POA: Diagnosis not present

## 2015-01-10 DIAGNOSIS — S299XXA Unspecified injury of thorax, initial encounter: Secondary | ICD-10-CM | POA: Diagnosis not present

## 2015-01-10 HISTORY — DX: Chronic kidney disease, unspecified: N18.9

## 2015-01-10 LAB — BASIC METABOLIC PANEL
ANION GAP: 6 (ref 5–15)
BUN: 19 mg/dL (ref 6–23)
CHLORIDE: 112 mmol/L (ref 96–112)
CO2: 24 mmol/L (ref 19–32)
Calcium: 8.9 mg/dL (ref 8.4–10.5)
Creatinine, Ser: 1.64 mg/dL — ABNORMAL HIGH (ref 0.50–1.10)
GFR calc Af Amer: 35 mL/min — ABNORMAL LOW (ref >90)
GFR calc non Af Amer: 30 mL/min — ABNORMAL LOW (ref >90)
Glucose, Bld: 218 mg/dL — ABNORMAL HIGH (ref 70–99)
POTASSIUM: 4.5 mmol/L (ref 3.5–5.1)
Sodium: 142 mmol/L (ref 135–145)

## 2015-01-10 LAB — PREPARE RBC (CROSSMATCH)

## 2015-01-10 LAB — SURGICAL PCR SCREEN
MRSA, PCR: NEGATIVE
STAPHYLOCOCCUS AUREUS: NEGATIVE

## 2015-01-10 LAB — CBC
HCT: 28.9 % — ABNORMAL LOW (ref 36.0–46.0)
HEMOGLOBIN: 9.5 g/dL — AB (ref 12.0–15.0)
MCH: 26.5 pg (ref 26.0–34.0)
MCHC: 32.9 g/dL (ref 30.0–36.0)
MCV: 80.5 fL (ref 78.0–100.0)
Platelets: 261 10*3/uL (ref 150–400)
RBC: 3.59 MIL/uL — AB (ref 3.87–5.11)
RDW: 15.2 % (ref 11.5–15.5)
WBC: 10.7 10*3/uL — AB (ref 4.0–10.5)

## 2015-01-10 LAB — PROTIME-INR
INR: 1.01 (ref 0.00–1.49)
PROTHROMBIN TIME: 13.4 s (ref 11.6–15.2)

## 2015-01-10 LAB — GLUCOSE, CAPILLARY
Glucose-Capillary: 101 mg/dL — ABNORMAL HIGH (ref 70–99)
Glucose-Capillary: 111 mg/dL — ABNORMAL HIGH (ref 70–99)

## 2015-01-10 LAB — RETICULOCYTES
RBC.: 3.63 MIL/uL — ABNORMAL LOW (ref 3.87–5.11)
Retic Count, Absolute: 39.9 10*3/uL (ref 19.0–186.0)
Retic Ct Pct: 1.1 % (ref 0.4–3.1)

## 2015-01-10 LAB — IRON AND TIBC
Iron: 47 ug/dL (ref 42–145)
Saturation Ratios: 19 % — ABNORMAL LOW (ref 20–55)
TIBC: 252 ug/dL (ref 250–470)
UIBC: 205 ug/dL (ref 125–400)

## 2015-01-10 LAB — ABO/RH: ABO/RH(D): O POS

## 2015-01-10 LAB — BRAIN NATRIURETIC PEPTIDE: B Natriuretic Peptide: 106 pg/mL — ABNORMAL HIGH (ref 0.0–100.0)

## 2015-01-10 MED ORDER — HYDROMORPHONE HCL 1 MG/ML IJ SOLN
0.5000 mg | INTRAMUSCULAR | Status: DC | PRN
  Administered 2015-01-10 – 2015-01-11 (×2): 0.5 mg via INTRAVENOUS
  Filled 2015-01-10 (×2): qty 1

## 2015-01-10 MED ORDER — INSULIN ASPART 100 UNIT/ML ~~LOC~~ SOLN
0.0000 [IU] | Freq: Three times a day (TID) | SUBCUTANEOUS | Status: DC
  Administered 2015-01-11: 5 [IU] via SUBCUTANEOUS
  Administered 2015-01-12: 2 [IU] via SUBCUTANEOUS
  Administered 2015-01-12: 3 [IU] via SUBCUTANEOUS
  Administered 2015-01-12: 2 [IU] via SUBCUTANEOUS
  Administered 2015-01-13: 8 [IU] via SUBCUTANEOUS

## 2015-01-10 MED ORDER — DOCUSATE SODIUM 100 MG PO CAPS
100.0000 mg | ORAL_CAPSULE | Freq: Two times a day (BID) | ORAL | Status: DC
  Administered 2015-01-10: 100 mg via ORAL
  Filled 2015-01-10: qty 1

## 2015-01-10 MED ORDER — DEXTROSE-NACL 5-0.9 % IV SOLN
INTRAVENOUS | Status: DC
  Administered 2015-01-10: 18:00:00 via INTRAVENOUS

## 2015-01-10 MED ORDER — SODIUM CHLORIDE 0.9 % IV SOLN: INTRAVENOUS | Status: DC

## 2015-01-10 MED ORDER — INSULIN ASPART 100 UNIT/ML ~~LOC~~ SOLN: 0.0000 [IU] | Freq: Every day | SUBCUTANEOUS | Status: DC

## 2015-01-10 MED ORDER — DEXTROSE-NACL 5-0.9 % IV SOLN: INTRAVENOUS | Status: DC

## 2015-01-10 MED ORDER — ONDANSETRON HCL 4 MG PO TABS: 4.0000 mg | ORAL_TABLET | Freq: Four times a day (QID) | ORAL | Status: DC | PRN

## 2015-01-10 MED ORDER — TRAZODONE HCL 50 MG PO TABS: 25.0000 mg | ORAL_TABLET | Freq: Every evening | ORAL | Status: DC | PRN

## 2015-01-10 MED ORDER — CEFAZOLIN SODIUM-DEXTROSE 2-3 GM-% IV SOLR
2.0000 g | INTRAVENOUS | Status: AC
  Administered 2015-01-11: 2 g via INTRAVENOUS
  Filled 2015-01-10 (×2): qty 50

## 2015-01-10 MED ORDER — CHLORHEXIDINE GLUCONATE 4 % EX LIQD
60.0000 mL | Freq: Once | CUTANEOUS | Status: AC
  Administered 2015-01-11: 4 via TOPICAL
  Filled 2015-01-10: qty 60

## 2015-01-10 MED ORDER — HYDROCODONE-ACETAMINOPHEN 5-325 MG PO TABS: 1.0000 | ORAL_TABLET | ORAL | Status: DC | PRN

## 2015-01-10 MED ORDER — ACETAMINOPHEN 650 MG RE SUPP: 650.0000 mg | Freq: Four times a day (QID) | RECTAL | Status: DC | PRN

## 2015-01-10 MED ORDER — SODIUM CHLORIDE 0.9 % IV SOLN
Freq: Once | INTRAVENOUS | Status: AC
  Administered 2015-01-10: 10 mL/h via INTRAVENOUS

## 2015-01-10 MED ORDER — ONDANSETRON HCL 4 MG/2ML IJ SOLN: 4.0000 mg | Freq: Four times a day (QID) | INTRAMUSCULAR | Status: DC | PRN

## 2015-01-10 MED ORDER — ACETAMINOPHEN 325 MG PO TABS: 650.0000 mg | ORAL_TABLET | Freq: Four times a day (QID) | ORAL | Status: DC | PRN

## 2015-01-10 NOTE — ED Notes (Signed)
Hospitalist at the bedside 

## 2015-01-10 NOTE — H&P (Signed)
[facility] History and Physical  [place] Muller VEH:[id number] DOB: Jan 12, 1941 DOA: 01/10/2015  Referring physician:  PCP: Lilyan Punt, MD   Chief Complaint: fall and right hip pain  HPI: Caitlin Riddle is a very pleasant 74 y.o. female with past medical history that includes advanced dementia is a resident at assisted living [name], diabetes, chronic kidney disease presents to the emergency department with the chief complaint of recent fall and subsequent right hip pain. Initial evaluation reveals a displaced intertrochanteric fracture of the proximal right femur.  Information is obtained from the sister who is at the bedside as well as reports from the nursing facility. Today patient was in the shower chair which gave way and she fell onto her right knee. Therefore present and reportedly there was no loss of consciousness and she did not strike her head. Shortly thereafter she complained of pain in that right leg particular with repositioning and bearing weight. Prior to this she was in her usual state of health. There were no reports of any fever chills sick contacts or recent illness. No reports of nausea vomiting diarrhea constipation.  Workup in the emergency department includes a chest x-ray that is negative for any active disease, x-ray of that right knee without fracture but confirms osteoarthritis, x-ray of the right hip as noted above, complete blood count significant for WBCs of 10.7 hemoglobin 9.5, basic metabolic panel significant for creatinine of 1.64 and a serum glucose of 218. She is hemodynamically stable afebrile and not hypoxic. ProBNP is 106.   Review of Systems:  Unable to obtain from patient due to advanced dementia. 10 point review of systems reviewed with sister who is at the bedside and all are negative except as indicated in the history of present illness   Past Medical History  Diagnosis Date  . Diabetes mellitus   . Hypertension   . Arthritis     . Hyperlipidemia   . Vision loss of left eye   . Arthralgia   . Renal insufficiency   . Dementia   . CKD (chronic kidney disease)     stage II   Past Surgical History  Procedure Laterality Date  . Total abdominal hysterectomy    . Colonoscopy    . Esophagogastroduodenoscopy     Social History:  reports that she has never smoked. She has never used smokeless tobacco. She reports that she does not drink alcohol or use illicit drugs. She has been a resident of [name] for 2 years. No Known Allergies  History reviewed. No pertinent family history. mother and father both deceased. There medical history positive for heart disease diabetes dementia  Prior to Admission medications   Medication Sig Start Date End Date Taking? Authorizing Provider  cadexomer iodine (IODOSORB) 0.9 % gel Apply 1 application topically every 3 (three) days.   Yes Historical Provider, MD  cholecalciferol (VITAMIN D) 400 UNITS TABS tablet Take 800 Units by mouth daily.   Yes Historical Provider, MD  levothyroxine (SYNTHROID, LEVOTHROID) 25 MCG tablet Take 1 tablet (25 mcg total) by mouth daily before breakfast. 12/28/13  Yes Babs Sciara, MD  memantine (NAMENDA) 5 MG tablet Take 1 tablet (5 mg total) by mouth 2 (two) times daily. 11/29/13  Yes Babs Sciara, MD  Nutritional Supplements (CARNATION INST BREAKFAST JUICE) LIQD Take 1 Bottle by mouth daily.   Yes Historical Provider, MD  pravastatin (PRAVACHOL) 20 MG tablet TAKE 1 TABLET BY MOUTH ONCE DAILY. 10/25/14  Yes Vilinda Blanks  Luking, MD  promethazine (PHENERGAN) 25 MG tablet TAKE 1 TABLET BY MOUTH EVERY 6 HOURS AS NEEDED FOR NAUSE OR VOMITING. 10/12/14  Yes Babs Sciara, MD  Propylene Glycol (SYSTANE BALANCE OP) Apply 1 drop to eye 3 (three) times daily.   Yes Historical Provider, MD  SANTYL ointment APPLY TO AFFECTED AREA(S) ONCE DAILY AS NEEDED. Patient taking differently: APPLY TO AFFECTED AREA(S) ONCE DAILY AS NEEDED FOR WOUND. 08/05/14  Yes  Babs Sciara, MD  traMADol (ULTRAM) 50 MG tablet TAKE (1) TABLET BY MOUTH BID PRN PAIN Patient taking differently: Take 50 mg by mouth 2 (two) times daily.  09/14/14  Yes Babs Sciara, MD  ZINC OXIDE, TOPICAL, 10 % CREA APPLY TO AFFECTED AREA AS NEEDED AFTER BOWEL MOVEMENT. 10/13/14  Yes Merlyn Albert, MD   Physical Exam: Filed Vitals:   01/10/15 1023 01/10/15 1331  BP: 135/53 144/65  Pulse: 85 89  Temp:  98 F (36.7 C)  TempSrc:  Oral  Resp: 16 12  Weight: 43.092 kg (95 lb)   SpO2: 100% 100%    Wt Readings from Last 3 Encounters:  01/10/15 43.092 kg (95 lb)  08/11/14 43.092 kg (95 lb)  07/28/14 45.36 kg (100 lb)    General:  Appears calm and comfortable, somewhat frail Eyes: Right pupil was round reactive to light normal lids, irises & conjunctiva, left eye missing ENT: grossly normal hearing, lips & tongue because membranes of her mouth are moist and pink Neck: no LAD, masses or thyromegaly Cardiovascular: RRR, no m/r/g. No LE edema.  Respiratory: CTA bilaterally, no w/r/r. Normal respiratory effort. Abdomen: soft, ntnd positive bowel sounds Skin: no rash or induration seen on limited exam Musculoskeletal: Poor muscle tone BUE/BLE, right leg slightly externally rotated right leg very tender to touch in the hip. Decreased range of motion at hip and knee secondary to pain Psychiatric: grossly normal mood and affect, speech fluent and appropriate Neurologic: She moves all extremities is unable to follow commands unable to make her wants and needs known. She makes good eye contact and says okay and yes to all questions and commands.           Labs on Admission:  Basic Metabolic Panel:  Recent Labs Lab 01/10/15 1252  NA 142  K 4.5  CL 112  CO2 24  GLUCOSE 218*  BUN 19  CREATININE 1.64*  CALCIUM 8.9   Liver Function Tests: No results for input(s): AST, ALT, ALKPHOS, BILITOT, PROT, ALBUMIN in the last 168 hours. No results for input(s): LIPASE, AMYLASE in the  last 168 hours. No results for input(s): AMMONIA in the last 168 hours. CBC:  Recent Labs Lab 01/10/15 1252  WBC 10.7*  HGB 9.5*  HCT 28.9*  MCV 80.5  PLT 261   Cardiac Enzymes: No results for input(s): CKTOTAL, CKMB, CKMBINDEX, TROPONINI in the last 168 hours.  BNP (last 3 results)  Recent Labs  01/10/15 1252  BNP 106.0*    ProBNP (last 3 results) No results for input(s): PROBNP in the last 8760 hours.  CBG: No results for input(s): GLUCAP in the last 168 hours.  Radiological Exams on Admission: Dg Chest Port 1 View  01/10/2015   CLINICAL DATA:  Fall.  Hypertension.  EXAM: PORTABLE CHEST - 1 VIEW  COMPARISON:  None.  FINDINGS: The heart size and mediastinal contours are within normal limits. Both lungs are clear. No pneumothorax or pleural effusion is noted. The visualized skeletal structures are unremarkable.  IMPRESSION: No active  disease.   Electronically Signed   By: Lupita Raider, M.D.   On: 01/10/2015 13:46   Dg Knee Complete 4 Views Right  01/10/2015   CLINICAL DATA:  Larey Seat this morning in shower when shower chair gave way, landed on RIGHT knee, RIGHT hip and knee pain  EXAM: RIGHT KNEE - COMPLETE 4+ VIEW  COMPARISON:  09/08/2013  FINDINGS: Marked osseous demineralization.  Diffuse joint space narrowing and spur formation.  Tiny chronic RIGHT knee joint effusion.  Patellar spur at quadriceps tendon insertion.  Deformities of the proximal RIGHT tibia and fibula from old healed fractures.  No acute fracture, dislocation, or bone destruction.  IMPRESSION: Osseous demineralization.  Old healed proximal RIGHT tibial and fibular fractures.  Osteoarthritic changes RIGHT knee with small joint effusion.  No definite acute fracture or dislocation identified.   Electronically Signed   By: Ulyses Southward M.D.   On: 01/10/2015 12:38   Dg Hip Unilat With Pelvis 2-3 Views Right  01/10/2015   CLINICAL DATA:  Acute right hip pain after fall in shower this morning. Initial encounter.  EXAM:  RIGHT HIP (WITH PELVIS) 2-3 VIEWS  COMPARISON:  None.  FINDINGS: Mildly displaced intertrochanteric fracture of the proximal right femur is noted. This appears to be closed and posttraumatic. 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  IMPRESSION: Mildly displaced intertrochanteric fracture of proximal right femur.   Electronically Signed   By: Lupita Raider, M.D.   On: 01/10/2015 12:35    EKG:   Assessment/Plan Principal Problem:   Hip fracture; as a result of a mechanical fall in the shower. Reportedly no loss of consciousness and no head injury. She was evaluated by orthopedics in the emergency department. She was typed and crossed for 2 units and made nothing by mouth per orthopedics. Family indicates M.D. reported surgery likely tomorrow. Will provide pain medicine as indicated.  Active Problems:  Acute on chronic CKD (chronic kidney disease); stage II. Chart review indicates creatinine ranges 1.35-1.48. Currently creatinine is 1.6. We'll very gently hydrate. Will hold any nephrotoxins. Will monitor urine output. If no improvement will consider renal ultrasound.    Iron deficiency anemia: Per chart review and recent PCP note as well as anemia of chronic disease given her rheumatoid arthritis. Chart review indicates a myoglobin range 9.5-10.5. She has been typed and crossed and will be transfused 2 units of packed RBCs per orthopedic surgery.    Rheumatoid arthritis: Appears stable at baseline. PCP note indicates not a candidate for IV therapy due to #2.    Type 2 diabetes mellitus with HbA1C goal below 7.5: Hemoglobin A1c 2 months ago 6.4. She was recently taken off metformin due to #2. We'll provide car modified diet once she is off nothing by mouth status. Will use sliding scale insulin for optimal control.    Alzheimer's disease: Sr. states she's currently at baseline. She is incontinent most of the time, is unable to make her wants and needs known, sometimes requires  feeding but always requires encouragement to eat.    Hypothyroidism: 2 months ago TSH 2.35 down from 12.2 one year ago per chart review. Will continue her Synthroid.    Dr Romeo Apple evaluated in ED Code Status: full DVT Prophylaxis: Family Communication: sister at bedside Disposition Plan: likely need snf  Time spent: 60 minutes  [facility]

## 2015-01-10 NOTE — ED Provider Notes (Signed)
CSN: [id number]     Arrival date & time 01/10/15  1020 History  This chart was scribed for Eber Hong, MD by Tonye Royalty, ED Scribe. This patient was seen in room APA07/APA07 and the patient's care was started at 10:39 AM.    LEVEL 5 CAVEAT: dementia  Chief Complaint  Patient presents with  . Fall   The history is provided by the patient. The history is limited by the condition of the patient. No language interpreter was used.    HPI Comments: Caitlin Riddle is a 74 y.o. female resident at [facility] with history of dementia who presents to the Emergency Department complaining of fall this morning. Reportedy, she was in the shower when her shower chair gave way and she fell, landing on her right knee. This was witnessed by staff. She did not strike her head.  Past Medical History  Diagnosis Date  . Diabetes mellitus   . Hypertension   . Arthritis   . Hyperlipidemia   . Vision loss of left eye   . Arthralgia   . Renal insufficiency   . Dementia    Past Surgical History  Procedure Laterality Date  . Total abdominal hysterectomy    . Colonoscopy    . Esophagogastroduodenoscopy     History reviewed. No pertinent family history. History  Substance Use Topics  . Smoking status: Never Smoker   . Smokeless tobacco: Never Used  . Alcohol Use: No   OB History    No data available     Review of Systems  Unable to perform ROS: Dementia  Musculoskeletal:       Right knee pain      Allergies  Review of patient's allergies indicates no known allergies.  Home Medications   Prior to Admission medications   Medication Sig Start Date End Date Taking? Authorizing Provider  cadexomer iodine (IODOSORB) 0.9 % gel Apply 1 application topically every 3 (three) days.   Yes Historical Provider, MD  cholecalciferol (VITAMIN D) 400 UNITS TABS tablet Take 800 Units by mouth daily.   Yes Historical Provider, MD  levothyroxine (SYNTHROID, LEVOTHROID) 25 MCG tablet Take 1 tablet (25 mcg  total) by mouth daily before breakfast. 12/28/13  Yes Babs Sciara, MD  memantine (NAMENDA) 5 MG tablet Take 1 tablet (5 mg total) by mouth 2 (two) times daily. 11/29/13  Yes Babs Sciara, MD  Nutritional Supplements (CARNATION INST BREAKFAST JUICE) LIQD Take 1 Bottle by mouth daily.   Yes Historical Provider, MD  pravastatin (PRAVACHOL) 20 MG tablet TAKE 1 TABLET BY MOUTH ONCE DAILY. 10/25/14  Yes Merlyn Albert, MD  promethazine (PHENERGAN) 25 MG tablet TAKE 1 TABLET BY MOUTH EVERY 6 HOURS AS NEEDED FOR NAUSE OR VOMITING. 10/12/14  Yes Babs Sciara, MD  Propylene Glycol (SYSTANE BALANCE OP) Apply 1 drop to eye 3 (three) times daily.   Yes Historical Provider, MD  SANTYL ointment APPLY TO AFFECTED AREA(S) ONCE DAILY AS NEEDED. Patient taking differently: APPLY TO AFFECTED AREA(S) ONCE DAILY AS NEEDED FOR WOUND. 08/05/14  Yes Babs Sciara, MD  traMADol (ULTRAM) 50 MG tablet TAKE (1) TABLET BY MOUTH BID PRN PAIN Patient taking differently: Take 50 mg by mouth 2 (two) times daily.  09/14/14  Yes Scott A Luking, MD  ZINC OXIDE, TOPICAL, 10 % CREA APPLY TO AFFECTED AREA AS NEEDED AFTER BOWEL MOVEMENT. 10/13/14  Yes Merlyn Albert, MD   BP 144/65 mmHg  Pulse 89  Temp(Src) 98 F (36.7  C) (Oral)  Resp 12  Wt 95 lb (43.092 kg)  SpO2 100% Physical Exam  Constitutional: She appears well-developed and well-nourished.  HENT:  Head: Normocephalic and atraumatic.  Eyes: Conjunctivae are normal. Right eye exhibits no discharge. Left eye exhibits no discharge.  Pulmonary/Chest: Effort normal. No respiratory distress.  Musculoskeletal:  Tenderness with manipulation of right leg at knee, thigh, and hip  Neurological: She is alert. Coordination normal.  Skin: Skin is warm and dry. No rash noted. She is not diaphoretic. No erythema.  Psychiatric: She has a normal mood and affect.  Nursing note and vitals reviewed.   ED Course  Procedures (including critical care time)  DIAGNOSTIC STUDIES: Oxygen  Saturation is 100% on room air, normal by my interpretation.    COORDINATION OF CARE: 10:42 AM Discussed treatment plan with patient at beside, the patient agrees with the plan and has no further questions at this time.   Labs Review Labs Reviewed  CBC - Abnormal; Notable for the following:    WBC 10.7 (*)    RBC 3.59 (*)    Hemoglobin 9.5 (*)    HCT 28.9 (*)    All other components within normal limits  BASIC METABOLIC PANEL - Abnormal; Notable for the following:    Glucose, Bld 218 (*)    Creatinine, Ser 1.64 (*)    GFR calc non Af Amer 30 (*)    GFR calc Af Amer 35 (*)    All other components within normal limits  BRAIN NATRIURETIC PEPTIDE - Abnormal; Notable for the following:    B Natriuretic Peptide 106.0 (*)    All other components within normal limits  PROTIME-INR  PREPARE RBC (CROSSMATCH)    Imaging Review Dg Chest Port 1 View  01/10/2015   CLINICAL DATA:  Fall.  Hypertension.  EXAM: PORTABLE CHEST - 1 VIEW  COMPARISON:  None.  FINDINGS: The heart size and mediastinal contours are within normal limits. Both lungs are clear. No pneumothorax or pleural effusion is noted. The visualized skeletal structures are unremarkable.  IMPRESSION: No active disease.   Electronically Signed   By: Lupita Raider, M.D.   On: 01/10/2015 13:46   Dg Knee Complete 4 Views Right  01/10/2015   CLINICAL DATA:  Larey Seat this morning in shower when shower chair gave way, landed on RIGHT knee, RIGHT hip and knee pain  EXAM: RIGHT KNEE - COMPLETE 4+ VIEW  COMPARISON:  09/08/2013  FINDINGS: Marked osseous demineralization.  Diffuse joint space narrowing and spur formation.  Tiny chronic RIGHT knee joint effusion.  Patellar spur at quadriceps tendon insertion.  Deformities of the proximal RIGHT tibia and fibula from old healed fractures.  No acute fracture, dislocation, or bone destruction.  IMPRESSION: Osseous demineralization.  Old healed proximal RIGHT tibial and fibular fractures.  Osteoarthritic changes  RIGHT knee with small joint effusion.  No definite acute fracture or dislocation identified.   Electronically Signed   By: Ulyses Southward M.D.   On: 01/10/2015 12:38   Dg Hip Unilat With Pelvis 2-3 Views Right  01/10/2015   CLINICAL DATA:  Acute right hip pain after fall in shower this morning. Initial encounter.  EXAM: RIGHT HIP (WITH PELVIS) 2-3 VIEWS  COMPARISON:  None.  FINDINGS: Mildly displaced intertrochanteric fracture of the proximal right femur is noted. This appears to be closed and posttraumatic. 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  IMPRESSION: Mildly displaced intertrochanteric fracture of proximal right femur.   Electronically Signed   By:  Lupita Raider, M.D.   On: 01/10/2015 12:35     EKG Interpretation   Date/Time:  Tuesday January 10 2015 13:30:34 EDT Ventricular Rate:  86 PR Interval:  135 QRS Duration: 74 QT Interval:  353 QTC Calculation: 422 R Axis:   53 Text Interpretation:  Sinus rhythm Borderline T wave abnormalities  Baseline wander in lead(s) V3 since last tracing no significant change  Confirmed by [facility]  MD, Terissa Haffey ([id number]) on 01/10/2015 1:41:14 PM      MDM   X-rays review, hip fracture seen. Pre-op labs ordered.  Final diagnoses:  Trauma  Hip fracture, right, closed, initial encounter   X-rays reviewed, hip fracture is obvious on the right, vital signs remain normal, preop chest x-ray and EKG unremarkable, labs unremarkable, discussed care with Dr. Romeo Apple who will consult from orthopedics, will admit to the medical service.  I personally performed the services described in this documentation, which was scribed in my presence. The recorded information has been reviewed and is accurate.  D/w Dr. Sherrie Mustache who will admit      Eber Hong, MD 01/10/15 [phone]

## 2015-01-10 NOTE — ED Notes (Signed)
Dr Romeo Apple at the bedside, pt denies pain unless there is movement of leg

## 2015-01-10 NOTE — Consult Note (Signed)
Reason for Consult:hip fracture  Referring Physician: DR Fredda Hammed T Caitlin Riddle is an 74 y.o. female.  HPI: Caitlin Riddle OFF SHOWER CHAIR, C/O RIGHT HIP PAIN. ER EVAL: XRAYS FRACTURED RIGHT HIP  BED TO CHAIR MINIMAL AMBULATOR.   Past Medical History  Diagnosis Date  . Diabetes mellitus   . Hypertension   . Arthritis   . Hyperlipidemia   . Vision loss of left eye   . Arthralgia   . Renal insufficiency   . Dementia     Past Surgical History  Procedure Laterality Date  . Total abdominal hysterectomy    . Colonoscopy    . Esophagogastroduodenoscopy      History reviewed. No pertinent family history.  Social History:  reports that she has never smoked. She has never used smokeless tobacco. She reports that she does not drink alcohol or use illicit drugs.  Allergies: No Known Allergies  Medications: I have reviewed the patient's current medications.  Results for orders placed or performed during the hospital encounter of 01/10/15 (from the past 48 hour(s))  CBC     Status: Abnormal   Collection Time: 01/10/15 12:52 PM  Result Value Ref Range   WBC 10.7 (H) 4.0 - 10.5 K/uL   RBC 3.59 (L) 3.87 - 5.11 MIL/uL   Hemoglobin 9.5 (L) 12.0 - 15.0 g/dL   HCT 28.9 (L) 36.0 - 46.0 %   MCV 80.5 78.0 - 100.0 fL   MCH 26.5 26.0 - 34.0 pg   MCHC 32.9 30.0 - 36.0 g/dL   RDW 15.2 11.5 - 15.5 %   Platelets 261 150 - 400 K/uL  Basic metabolic panel     Status: Abnormal   Collection Time: 01/10/15 12:52 PM  Result Value Ref Range   Sodium 142 135 - 145 mmol/L   Potassium 4.5 3.5 - 5.1 mmol/L   Chloride 112 96 - 112 mmol/L   CO2 24 19 - 32 mmol/L   Glucose, Bld 218 (H) 70 - 99 mg/dL   BUN 19 6 - 23 mg/dL   Creatinine, Ser 1.64 (H) 0.50 - 1.10 mg/dL   Calcium 8.9 8.4 - 10.5 mg/dL   GFR calc non Af Amer 30 (L) >90 mL/min   GFR calc Af Amer 35 (L) >90 mL/min    Comment: (NOTE) The eGFR has been calculated using the CKD EPI equation. This calculation has not been  validated in all clinical situations. eGFR's persistently <90 mL/min signify possible Chronic Kidney Disease.    Anion gap 6 5 - 15  Brain natriuretic peptide     Status: Abnormal   Collection Time: 01/10/15 12:52 PM  Result Value Ref Range   B Natriuretic Peptide 106.0 (H) 0.0 - 100.0 pg/mL  Protime-INR     Status: None   Collection Time: 01/10/15 12:52 PM  Result Value Ref Range   Prothrombin Time 13.4 11.6 - 15.2 seconds   INR 1.01 0.00 - 1.49    Dg Chest Port 1 View  01/10/2015   CLINICAL DATA:  Fall.  Hypertension.  EXAM: PORTABLE CHEST - 1 VIEW  COMPARISON:  None.  FINDINGS: The heart size and mediastinal contours are within normal limits. Both lungs are clear. No pneumothorax or pleural effusion is noted. The visualized skeletal structures are unremarkable.  IMPRESSION: No active disease.   Electronically Signed   By: Marijo Conception, M.D.   On: 01/10/2015 13:46   Dg Knee Complete 4 Views Right  01/10/2015   CLINICAL  DATA:  [place] this morning in shower when shower chair gave way, landed on RIGHT knee, RIGHT hip and knee pain  EXAM: RIGHT KNEE - COMPLETE 4+ VIEW  COMPARISON:  09/08/2013  FINDINGS: Marked osseous demineralization.  Diffuse joint space narrowing and spur formation.  Tiny chronic RIGHT knee joint effusion.  Patellar spur at quadriceps tendon insertion.  Deformities of the proximal RIGHT tibia and fibula from old healed fractures.  No acute fracture, dislocation, or bone destruction.  IMPRESSION: Osseous demineralization.  Old healed proximal RIGHT tibial and fibular fractures.  Osteoarthritic changes RIGHT knee with small joint effusion.  No definite acute fracture or dislocation identified.   Electronically Signed   By: Lavonia Dana M.D.   On: 01/10/2015 12:38   Dg Hip Unilat With Pelvis 2-3 Views Right  01/10/2015   CLINICAL DATA:  Acute right hip pain after fall in shower this morning. Initial encounter.  EXAM: RIGHT HIP (WITH PELVIS) 2-3 VIEWS  COMPARISON:  None.  FINDINGS:  Mildly displaced intertrochanteric fracture of the proximal right femur is noted. This appears to be closed and posttraumatic. 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  IMPRESSION: Mildly displaced intertrochanteric fracture of proximal right femur.   Electronically Signed   By: Marijo Conception, M.D.   On: 01/10/2015 12:35    ROS Blood pressure 144/65, pulse 89, temperature 98 F (36.7 C), temperature source Oral, resp. rate 12, weight 95 lb (43.092 kg), SpO2 100 %. Physical Exam  Nursing note and vitals reviewed. Constitutional: She appears well-developed. No distress.  HENT:  Head: Normocephalic.  Eyes: Conjunctivae are normal. Right eye exhibits no discharge. Left eye exhibits no discharge. No scleral icterus.  Neck: Neck supple. No JVD present. No tracheal deviation present. No thyromegaly present.  Cardiovascular: Normal rate.   Respiratory: Effort normal. No stridor.  GI: Soft. She exhibits no distension.  Musculoskeletal:       Right shoulder: Normal.       Left shoulder: Normal.       Right elbow: Normal.      Left elbow: Normal.       Right hip: She exhibits decreased range of motion, tenderness and bony tenderness.       Left hip: Normal.       Right knee: She exhibits normal range of motion.       Left knee: She exhibits decreased range of motion.       Right ankle: Normal.       Left ankle: Normal.  Lymphadenopathy:    She has no cervical adenopathy.  Neurological: She is alert. She exhibits normal muscle tone.  Skin: Skin is warm and dry.  Psychiatric: She has a normal mood and affect. Her behavior is normal.    Assessment/Plan: 73 YO FEMALE BED TO CHAIR AT NURSING HOME WITH RIGHT INTERTROCHANTERIC HIP FRACTURE, AND ANEMIA HG 9.5, WILL NEED STABILIZATION OF FRACTURE WITH INTERNAL FIXATION WITH RIGHT HIP GAMMA NAIL DEVICE    Caitlin Riddle 01/10/2015, 1:53 PM

## 2015-01-10 NOTE — ED Notes (Signed)
Resident at [facility].  Was in the shower this morning and her shower chair gave way.  witnessed by staff.  Pt went  Down on her right knee.  Per staff she did not hit her head.

## 2015-01-11 ENCOUNTER — Inpatient Hospital Stay (HOSPITAL_COMMUNITY): Payer: Medicare Other | Admitting: Anesthesiology

## 2015-01-11 ENCOUNTER — Encounter (HOSPITAL_COMMUNITY)
Admission: EM | Disposition: A | Discharge status: Skilled Nursing Facility | Payer: Self-pay | Source: Home / Self Care | Attending: Internal Medicine

## 2015-01-11 ENCOUNTER — Inpatient Hospital Stay (HOSPITAL_COMMUNITY): Payer: Medicare Other

## 2015-01-11 ENCOUNTER — Encounter (HOSPITAL_COMMUNITY): Payer: Self-pay | Admitting: *Deleted

## 2015-01-11 DIAGNOSIS — S72001A Fracture of unspecified part of neck of right femur, initial encounter for closed fracture: Secondary | ICD-10-CM

## 2015-01-11 DIAGNOSIS — S72141A Displaced intertrochanteric fracture of right femur, initial encounter for closed fracture: Secondary | ICD-10-CM | POA: Diagnosis present

## 2015-01-11 DIAGNOSIS — W19XXXD Unspecified fall, subsequent encounter: Secondary | ICD-10-CM

## 2015-01-11 DIAGNOSIS — W19XXXA Unspecified fall, initial encounter: Secondary | ICD-10-CM | POA: Insufficient documentation

## 2015-01-11 DIAGNOSIS — S72001D Fracture of unspecified part of neck of right femur, subsequent encounter for closed fracture with routine healing: Secondary | ICD-10-CM

## 2015-01-11 HISTORY — PX: INTRAMEDULLARY (IM) NAIL INTERTROCHANTERIC: SHX5875

## 2015-01-11 LAB — TYPE AND SCREEN
ABO/RH(D): O POS
Antibody Screen: NEGATIVE
UNIT DIVISION: 0
Unit division: 0

## 2015-01-11 LAB — BASIC METABOLIC PANEL
Anion gap: 7 (ref 5–15)
BUN: 16 mg/dL (ref 6–23)
CO2: 22 mmol/L (ref 19–32)
Calcium: 9 mg/dL (ref 8.4–10.5)
Chloride: 113 mmol/L — ABNORMAL HIGH (ref 96–112)
Creatinine, Ser: 1.3 mg/dL — ABNORMAL HIGH (ref 0.50–1.10)
GFR calc Af Amer: 47 mL/min — ABNORMAL LOW (ref >90)
GFR calc non Af Amer: 40 mL/min — ABNORMAL LOW (ref >90)
Glucose, Bld: 209 mg/dL — ABNORMAL HIGH (ref 70–99)
Potassium: 4.3 mmol/L (ref 3.5–5.1)
Sodium: 142 mmol/L (ref 135–145)

## 2015-01-11 LAB — CBC
HEMATOCRIT: 37.5 % (ref 36.0–46.0)
HEMOGLOBIN: 12.9 g/dL (ref 12.0–15.0)
MCH: 28.4 pg (ref 26.0–34.0)
MCHC: 34.4 g/dL (ref 30.0–36.0)
MCV: 82.6 fL (ref 78.0–100.0)
Platelets: 214 10*3/uL (ref 150–400)
RBC: 4.54 MIL/uL (ref 3.87–5.11)
RDW: 15.3 % (ref 11.5–15.5)
WBC: 11.1 10*3/uL — ABNORMAL HIGH (ref 4.0–10.5)

## 2015-01-11 LAB — GLUCOSE, CAPILLARY
GLUCOSE-CAPILLARY: 105 mg/dL — AB (ref 70–99)
GLUCOSE-CAPILLARY: 202 mg/dL — AB (ref 70–99)
GLUCOSE-CAPILLARY: 175 mg/dL — AB (ref 70–99)
Glucose-Capillary: 185 mg/dL — ABNORMAL HIGH (ref 70–99)
Glucose-Capillary: 140 mg/dL — ABNORMAL HIGH (ref 70–99)

## 2015-01-11 LAB — HEMOGLOBIN A1C
Hgb A1c MFr Bld: 6.3 % — ABNORMAL HIGH (ref 4.8–5.6)
MEAN PLASMA GLUCOSE: 134 mg/dL

## 2015-01-11 LAB — VITAMIN B12: Vitamin B-12: 376 pg/mL (ref 211–911)

## 2015-01-11 LAB — FERRITIN: Ferritin: 76 ng/mL (ref 10–291)

## 2015-01-11 LAB — FOLATE: Folate: 19 ng/mL

## 2015-01-11 SURGERY — FIXATION, FRACTURE, INTERTROCHANTERIC, WITH INTRAMEDULLARY ROD
Anesthesia: Spinal | Site: Hip | Laterality: Right

## 2015-01-11 MED ORDER — HYDRALAZINE HCL 20 MG/ML IJ SOLN: 10.0000 mg | Freq: Four times a day (QID) | INTRAMUSCULAR | Status: DC | PRN

## 2015-01-11 MED ORDER — POLYETHYLENE GLYCOL 3350 17 G PO PACK: 17.0000 g | PACK | Freq: Every day | ORAL | Status: DC | PRN

## 2015-01-11 MED ORDER — ONDANSETRON HCL 4 MG PO TABS: 4.0000 mg | ORAL_TABLET | Freq: Four times a day (QID) | ORAL | Status: DC | PRN

## 2015-01-11 MED ORDER — BISACODYL 10 MG RE SUPP: 10.0000 mg | Freq: Every day | RECTAL | Status: DC | PRN

## 2015-01-11 MED ORDER — FENTANYL CITRATE 0.05 MG/ML IJ SOLN
25.0000 ug | INTRAMUSCULAR | Status: DC
  Administered 2015-01-11: 25 ug via INTRAVENOUS

## 2015-01-11 MED ORDER — PHENOL 1.4 % MT LIQD: 1.0000 | OROMUCOSAL | Status: DC | PRN

## 2015-01-11 MED ORDER — MENTHOL 3 MG MT LOZG: 1.0000 | LOZENGE | OROMUCOSAL | Status: DC | PRN

## 2015-01-11 MED ORDER — FENTANYL CITRATE 0.05 MG/ML IJ SOLN
INTRAMUSCULAR | Status: AC
  Filled 2015-01-11: qty 2

## 2015-01-11 MED ORDER — BUPIVACAINE-EPINEPHRINE (PF) 0.5% -1:200000 IJ SOLN
INTRAMUSCULAR | Status: AC
  Filled 2015-01-11: qty 60

## 2015-01-11 MED ORDER — ACETAMINOPHEN 650 MG RE SUPP: 650.0000 mg | Freq: Four times a day (QID) | RECTAL | Status: DC | PRN

## 2015-01-11 MED ORDER — MORPHINE SULFATE 2 MG/ML IJ SOLN
0.5000 mg | INTRAMUSCULAR | Status: DC | PRN
  Administered 2015-01-11 (×2): 0.5 mg via INTRAVENOUS
  Filled 2015-01-11 (×2): qty 1

## 2015-01-11 MED ORDER — BUPIVACAINE IN DEXTROSE 0.75-8.25 % IT SOLN
INTRATHECAL | Status: DC | PRN
  Administered 2015-01-11: 15 mg via INTRATHECAL

## 2015-01-11 MED ORDER — FENTANYL CITRATE 0.05 MG/ML IJ SOLN: 25.0000 ug | INTRAMUSCULAR | Status: DC | PRN

## 2015-01-11 MED ORDER — ACETAMINOPHEN 325 MG PO TABS
650.0000 mg | ORAL_TABLET | Freq: Four times a day (QID) | ORAL | Status: DC | PRN
  Administered 2015-01-11: 650 mg via ORAL
  Filled 2015-01-11: qty 2

## 2015-01-11 MED ORDER — LIDOCAINE IN DEXTROSE 5-7.5 % IV SOLN
INTRAVENOUS | Status: DC | PRN
  Administered 2015-01-11: 20 mg via INTRATHECAL

## 2015-01-11 MED ORDER — FENTANYL CITRATE 0.05 MG/ML IJ SOLN
INTRAMUSCULAR | Status: DC | PRN
  Administered 2015-01-11: 12.5 ug via INTRAVENOUS
  Administered 2015-01-11: 25 ug via INTRAVENOUS
  Administered 2015-01-11: 12.5 ug via INTRATHECAL

## 2015-01-11 MED ORDER — SODIUM CHLORIDE 0.9 % IR SOLN
Status: DC | PRN
  Administered 2015-01-11 (×2): 1000 mL

## 2015-01-11 MED ORDER — PROPOFOL INFUSION 10 MG/ML OPTIME
INTRAVENOUS | Status: DC | PRN
  Administered 2015-01-11: 50 ug/kg/min via INTRAVENOUS

## 2015-01-11 MED ORDER — DOCUSATE SODIUM 100 MG PO CAPS
100.0000 mg | ORAL_CAPSULE | Freq: Two times a day (BID) | ORAL | Status: DC
  Administered 2015-01-11 – 2015-01-13 (×5): 100 mg via ORAL
  Filled 2015-01-11 (×5): qty 1

## 2015-01-11 MED ORDER — MAGNESIUM CITRATE PO SOLN: 1.0000 | Freq: Once | ORAL | Status: AC | PRN

## 2015-01-11 MED ORDER — POTASSIUM CHLORIDE IN NACL 20-0.9 MEQ/L-% IV SOLN
INTRAVENOUS | Status: DC
  Administered 2015-01-11 – 2015-01-12 (×3): via INTRAVENOUS

## 2015-01-11 MED ORDER — LIDOCAINE HCL (PF) 1 % IJ SOLN
INTRAMUSCULAR | Status: AC
  Filled 2015-01-11: qty 5

## 2015-01-11 MED ORDER — PHENYLEPHRINE HCL 10 MG/ML IJ SOLN
INTRAMUSCULAR | Status: DC | PRN
  Administered 2015-01-11: .4 mg via INTRAVENOUS

## 2015-01-11 MED ORDER — METOCLOPRAMIDE HCL 10 MG PO TABS: 5.0000 mg | ORAL_TABLET | Freq: Three times a day (TID) | ORAL | Status: DC | PRN

## 2015-01-11 MED ORDER — BOOST / RESOURCE BREEZE PO LIQD
1.0000 | Freq: Three times a day (TID) | ORAL | Status: DC
  Administered 2015-01-11 – 2015-01-13 (×7): 1 via ORAL

## 2015-01-11 MED ORDER — LACTATED RINGERS IV SOLN
INTRAVENOUS | Status: DC
  Administered 2015-01-11: 1000 mL via INTRAVENOUS

## 2015-01-11 MED ORDER — METOCLOPRAMIDE HCL 5 MG/ML IJ SOLN: 5.0000 mg | Freq: Three times a day (TID) | INTRAMUSCULAR | Status: DC | PRN

## 2015-01-11 MED ORDER — BUPIVACAINE-EPINEPHRINE (PF) 0.5% -1:200000 IJ SOLN
INTRAMUSCULAR | Status: DC | PRN
  Administered 2015-01-11: 30 mL

## 2015-01-11 MED ORDER — ALUM & MAG HYDROXIDE-SIMETH 200-200-20 MG/5ML PO SUSP: 30.0000 mL | ORAL | Status: DC | PRN

## 2015-01-11 MED ORDER — EPINEPHRINE HCL 0.1 MG/ML IJ SOSY
PREFILLED_SYRINGE | INTRAMUSCULAR | Status: DC | PRN
Start: 2015-01-11 — End: 2015-01-11
  Administered 2015-01-11: .1 ug via INTRATRACHEAL

## 2015-01-11 MED ORDER — ONDANSETRON HCL 4 MG/2ML IJ SOLN: 4.0000 mg | Freq: Four times a day (QID) | INTRAMUSCULAR | Status: DC | PRN

## 2015-01-11 MED ORDER — PROPOFOL 10 MG/ML IV BOLUS
INTRAVENOUS | Status: AC
  Filled 2015-01-11: qty 20

## 2015-01-11 MED ORDER — MIDAZOLAM HCL 2 MG/2ML IJ SOLN
1.0000 mg | INTRAMUSCULAR | Status: DC | PRN
  Administered 2015-01-11 (×2): 1 mg via INTRAVENOUS

## 2015-01-11 MED ORDER — CEFAZOLIN SODIUM-DEXTROSE 2-3 GM-% IV SOLR
2.0000 g | Freq: Four times a day (QID) | INTRAVENOUS | Status: AC
  Administered 2015-01-11 (×2): 2 g via INTRAVENOUS
  Filled 2015-01-11 (×2): qty 50

## 2015-01-11 MED ORDER — HYDROCODONE-ACETAMINOPHEN 5-325 MG PO TABS
1.0000 | ORAL_TABLET | Freq: Four times a day (QID) | ORAL | Status: DC | PRN
  Administered 2015-01-12: 1 via ORAL
  Filled 2015-01-11: qty 1

## 2015-01-11 MED ORDER — BUPIVACAINE IN DEXTROSE 0.75-8.25 % IT SOLN
INTRATHECAL | Status: AC
  Filled 2015-01-11: qty 2

## 2015-01-11 MED ORDER — PHENYLEPHRINE 40 MCG/ML (10ML) SYRINGE FOR IV PUSH (FOR BLOOD PRESSURE SUPPORT)
PREFILLED_SYRINGE | INTRAVENOUS | Status: AC
  Filled 2015-01-11: qty 10

## 2015-01-11 MED ORDER — ONDANSETRON HCL 4 MG/2ML IJ SOLN: 4.0000 mg | Freq: Once | INTRAMUSCULAR | Status: AC | PRN

## 2015-01-11 MED ORDER — MIDAZOLAM HCL 2 MG/2ML IJ SOLN
INTRAMUSCULAR | Status: AC
  Filled 2015-01-11: qty 2

## 2015-01-11 MED ORDER — ENOXAPARIN SODIUM 30 MG/0.3ML ~~LOC~~ SOLN
30.0000 mg | SUBCUTANEOUS | Status: DC
  Administered 2015-01-12 – 2015-01-13 (×2): 30 mg via SUBCUTANEOUS
  Filled 2015-01-11 (×2): qty 0.3

## 2015-01-11 SURGICAL SUPPLY — 54 items
BAG HAMPER (MISCELLANEOUS) ×3 IMPLANT
BIT DRILL AO GAMMA 4.2X300 (BIT) ×3 IMPLANT
BLADE 10 SAFETY STRL DISP (BLADE) ×2 IMPLANT
BNDG GAUZE ELAST 4 BULKY (GAUZE/BANDAGES/DRESSINGS) ×3 IMPLANT
CHLORAPREP W/TINT 26ML (MISCELLANEOUS) ×3 IMPLANT
CLOTH BEACON ORANGE TIMEOUT ST (SAFETY) ×3 IMPLANT
COVER LIGHT HANDLE STERIS (MISCELLANEOUS) ×6 IMPLANT
COVER MAYO STAND XLG (DRAPE) ×3 IMPLANT
DECANTER SPIKE VIAL GLASS SM (MISCELLANEOUS) ×5 IMPLANT
DRAPE STERI IOBAN 125X83 (DRAPES) ×3 IMPLANT
DRSG MEPILEX BORDER 4X12 (GAUZE/BANDAGES/DRESSINGS) ×3 IMPLANT
ELECT REM PT RETURN 9FT ADLT (ELECTROSURGICAL) ×3
ELECTRODE REM PT RTRN 9FT ADLT (ELECTROSURGICAL) ×1 IMPLANT
GLOVE BIO SURGEON STRL SZ 6.5 (GLOVE) ×2 IMPLANT
GLOVE BIO SURGEONS STRL SZ 6.5 (GLOVE) ×2
GLOVE BIOGEL PI IND STRL 7.0 (GLOVE) IMPLANT
GLOVE BIOGEL PI IND STRL 7.5 (GLOVE) IMPLANT
GLOVE BIOGEL PI INDICATOR 7.0 (GLOVE) ×4
GLOVE BIOGEL PI INDICATOR 7.5 (GLOVE) ×2
GLOVE SKINSENSE NS SZ8.0 LF (GLOVE) ×2
GLOVE SKINSENSE STRL SZ8.0 LF (GLOVE) ×1 IMPLANT
GLOVE SS N UNI LF 8.5 STRL (GLOVE) ×3 IMPLANT
GOWN STRL REUS W/TWL LRG LVL3 (GOWN DISPOSABLE) ×9 IMPLANT
GOWN STRL REUS W/TWL XL LVL3 (GOWN DISPOSABLE) ×3 IMPLANT
GUIDEROD T2 3X1000 (ROD) ×3 IMPLANT
INST SET MAJOR BONE (KITS) ×3 IMPLANT
K-WIRE  3.2X450M STR (WIRE) ×2
K-WIRE 3.2X450M STR (WIRE) ×1
KIT BLADEGUARD II DBL (SET/KITS/TRAYS/PACK) ×3 IMPLANT
KIT ROOM TURNOVER AP CYSTO (KITS) ×3 IMPLANT
KWIRE 3.2X450M STR (WIRE) ×1 IMPLANT
MANIFOLD NEPTUNE II (INSTRUMENTS) ×3 IMPLANT
MARKER SKIN DUAL TIP RULER LAB (MISCELLANEOUS) ×3 IMPLANT
NAIL TROCH GAMMA 11X18 (Nail) ×2 IMPLANT
NDL HYPO 21X1.5 SAFETY (NEEDLE) ×1 IMPLANT
NDL SPNL 18GX3.5 QUINCKE PK (NEEDLE) ×1 IMPLANT
NEEDLE HYPO 21X1.5 SAFETY (NEEDLE) ×3 IMPLANT
NEEDLE SPNL 18GX3.5 QUINCKE PK (NEEDLE) ×3 IMPLANT
NS IRRIG 1000ML POUR BTL (IV SOLUTION) ×5 IMPLANT
PACK BASIC III (CUSTOM PROCEDURE TRAY) ×3
PACK SRG BSC III STRL LF ECLPS (CUSTOM PROCEDURE TRAY) ×1 IMPLANT
PENCIL HANDSWITCHING (ELECTRODE) ×3 IMPLANT
SCREW LAG GAMMA 3 TI 10.5X85MM (Screw) ×2 IMPLANT
SCREW LOCKING T2 F/T  5MMX35MM (Screw) ×2 IMPLANT
SCREW LOCKING T2 F/T 5MMX35MM (Screw) IMPLANT
SET BASIN LINEN APH (SET/KITS/TRAYS/PACK) ×3 IMPLANT
SPONGE LAP 18X18 X RAY DECT (DISPOSABLE) ×6 IMPLANT
STAPLER VISISTAT 35W (STAPLE) ×2 IMPLANT
SUT MNCRL 0 VIOLET CTX 36 (SUTURE) ×1 IMPLANT
SUT MON AB 2-0 CT1 36 (SUTURE) ×3 IMPLANT
SUT MONOCRYL 0 CTX 36 (SUTURE) ×2
SYR 30ML LL (SYRINGE) ×3 IMPLANT
SYR BULB IRRIGATION 50ML (SYRINGE) ×6 IMPLANT
YANKAUER SUCT 12FT TUBE ARGYLE (SUCTIONS) ×3 IMPLANT

## 2015-01-11 NOTE — Care Management Note (Addendum)
    Page 1 of 1   01/13/2015     1:06:38 PM CARE MANAGEMENT NOTE 01/13/2015  Patient:  DECLYN, OFFIELD   Account Number:  [id number]  Date Initiated:  01/11/2015  Documentation initiated by:  Sharrie Rothman  Subjective/Objective Assessment:   Pt admitted from [facility] with hip fracture. Pt having surgery today.     Action/Plan:   PT eval after surgery. Pt will likely need placement at higher level of care at discharge. CSW is aware.   Anticipated DC Date:  01/14/2015   Anticipated DC Plan:  SKILLED NURSING FACILITY  In-house referral  Clinical Social Worker      DC Planning Services  CM consult      Choice offered to / List presented to:             Status of service:  Completed, signed off Medicare Important Message given?  YES (If response is "NO", the following Medicare IM given date fields will be blank) Date Medicare IM given:  01/13/2015 Medicare IM given by:  Sharrie Rothman Date Additional Medicare IM given:   Additional Medicare IM given by:    Discharge Disposition:  SKILLED NURSING FACILITY  Per UR Regulation:    If discussed at Long Length of Stay Meetings, dates discussed:    Comments:  01/13/15 1300 Arlyss Queen, RN BSN CM Pt discharging to [facility] today. CSW to arrange discharge to facility.  01/13/15 1035 Arlyss Queen, RN BSN CM Anticipate discharge within 24 hours. Pt will discharge to SNF. CSW to arrange discharge to facility.  01/11/15 1140 Arlyss Queen, RN BSN CM

## 2015-01-11 NOTE — Anesthesia Procedure Notes (Addendum)
Procedure Name: MAC Date/Time: 01/11/2015 7:31 AM Performed by: Franco Nones Pre-anesthesia Checklist: Patient identified, Emergency Drugs available, Suction available, Timeout performed and Patient being monitored Patient Re-evaluated:Patient Re-evaluated prior to inductionOxygen Delivery Method: Non-rebreather mask   Spinal Patient location during procedure: OR Start time: 01/11/2015 7:45 AM End time: 01/11/2015 7:47 AM Staffing Resident/CRNA: Minerva Areola S Preanesthetic Checklist Completed: patient identified, site marked, surgical consent, pre-op evaluation, timeout performed, IV checked, risks and benefits discussed and monitors and equipment checked Spinal Block Patient position: right lateral decubitus Prep: Betadine Patient monitoring: heart rate, cardiac monitor, continuous pulse ox and blood pressure Approach: right paramedian Location: L3-4 Injection technique: single-shot Needle Needle type: Spinocan  Needle gauge: 22 G Needle length: 9 cm Assessment Sensory level: T6 Additional Notes Betadine prep x 3 1% lidocaine skin wheal 1 cc Clear CSF pre and post injection   ATTEMPTS: 1 TRAY ID: [id number] TRAY EXPIRATION DATE: [id number]

## 2015-01-11 NOTE — Transfer of Care (Signed)
Immediate Anesthesia Transfer of Care Note  Patient: Caitlin Riddle  Procedure(s) Performed: Procedure(s): INTERNAL FIXATION RIGHT HIP (Right)  Patient Location: PACU  Anesthesia Type:Spinal  Level of Consciousness: awake and patient cooperative  Airway & Oxygen Therapy: Patient Spontanous Breathing and non-rebreather face mask  Post-op Assessment: Report given to RN and Post -op Vital signs reviewed and stable  Post vital signs: Reviewed and stable    Complications: No apparent anesthesia complications Blood sugar 140. Slight movement of legs. Denies pain

## 2015-01-11 NOTE — Progress Notes (Signed)
[facility] PROGRESS NOTE  Caitlin Riddle JGG:[id number] DOB: October 12, 1940 DOA: 01/10/2015 PCP: Lilyan Punt, MD  Assessment/Plan: Hip fracture; as a result of a mechanical fall in the shower. Surgery today. Pain management.   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 Chart review indicates creatinine ranges 1.35-1.48. Currently creatinine is 1.3. monitor   Iron deficiency anemia: s/p 2 units PRBC's 01/10/15.  Per chart review and recent PCP note as well as anemia of chronic disease given her rheumatoid arthritis.anemia panel with RBC 3.6 saturation ratio 19 otherwise unremarkable.  Chart review indicates a Hg baseline range 9.5-10.5. monitor   Rheumatoid arthritis: Stable at baseline. PCP note indicates not a candidate for IV therapy due to #2.   Type 2 diabetes mellitus with HbA1C goal below 7.5: Hemoglobin A1c 6.3. She was recently taken off metformin due to #2. We'll provide car modified diet once she is off nothing by mouth status. Will use sliding scale insulin for optimal control.   Alzheimer's disease: Sr. states she's currently at baseline. She is incontinent most of the time, is unable to make her wants and needs known, sometimes requires feeding but always requires encouragement to eat.   Hypothyroidism: 2 months ago TSH 2.35 down from 12.2 one year ago per chart review. Will continue her Synthroid.   Code Status: full Family Communication: sister at bedside Disposition Plan: snf   Consultants:  ortho  Procedures:   repair of Hip fx  01/11/15  Antibiotics:    HPI/Subjective: Arouses to verbal stimuli. Facial grimace   Objective: Filed Vitals:   01/11/15 1405  BP: 181/79  Pulse: 97  Temp: 99.6 F (37.6 C)  Resp: 18    Intake/Output Summary (Last 24 hours) at 01/11/15 1633 Last data filed at 01/11/15 1221  Gross per 24 hour  Intake 2305.5 ml  Output    125 ml  Net 2180.5 ml   Filed Weights   01/10/15 1023  01/10/15 1710  Weight: 43.092 kg (95 lb) 40.824 kg (90 lb)    Exam:   General:  Thin frail appears slightly uncomfortable  Cardiovascular: s1 and S2. No MGR No LE edema  Respiratory: normal effort somewhat shallow BS clear  Abdomen: flat soft +BS  Musculoskeletal: right hip with dressing dry and intact. Right foot warm to touch   Data Reviewed: Basic Metabolic Panel:  Recent Labs Lab 01/10/15 1252 01/11/15 0601  NA 142 142  K 4.5 4.3  CL 112 113*  CO2 24 22  GLUCOSE 218* 209*  BUN 19 16  CREATININE 1.64* 1.30*  CALCIUM 8.9 9.0   Liver Function Tests: No results for input(s): AST, ALT, ALKPHOS, BILITOT, PROT, ALBUMIN in the last 168 hours. No results for input(s): LIPASE, AMYLASE in the last 168 hours. No results for input(s): AMMONIA in the last 168 hours. CBC:  Recent Labs Lab 01/10/15 1252 01/11/15 0601  WBC 10.7* 11.1*  HGB 9.5* 12.9  HCT 28.9* 37.5  MCV 80.5 82.6  PLT 261 214   Cardiac Enzymes: No results for input(s): CKTOTAL, CKMB, CKMBINDEX, TROPONINI in the last 168 hours. BNP (last 3 results)  Recent Labs  01/10/15 1252  BNP 106.0*    ProBNP (last 3 results) No results for input(s): PROBNP in the last 8760 hours.  CBG:  Recent Labs Lab 01/10/15 1713 01/10/15 2124 01/11/15 0631 01/11/15 0924 01/11/15 1101  GLUCAP 101* 111* 185* 140* 105*    Recent Results (from the past 240 hour(s))  Surgical pcr screen  Status: None   Collection Time: 01/10/15  5:30 PM  Result Value Ref Range Status   MRSA, PCR NEGATIVE NEGATIVE Final   Staphylococcus aureus NEGATIVE NEGATIVE Final    Comment:        The Xpert SA Assay (FDA approved for NASAL specimens in patients over 82 years of age), is one component of a comprehensive surveillance program.  Test performance has been validated by [facility] for patients greater than or equal to 88 year old. It is not intended to diagnose infection nor to guide or monitor treatment.       Studies: Dg Chest Port 1 View  01/10/2015   CLINICAL DATA:  Fall.  Hypertension.  EXAM: PORTABLE CHEST - 1 VIEW  COMPARISON:  None.  FINDINGS: The heart size and mediastinal contours are within normal limits. Both lungs are clear. No pneumothorax or pleural effusion is noted. The visualized skeletal structures are unremarkable.  IMPRESSION: No active disease.   Electronically Signed   By: Lupita Raider, M.D.   On: 01/10/2015 13:46   Dg Knee Complete 4 Views Right  01/10/2015   CLINICAL DATA:  [place] this morning in shower when shower chair gave way, landed on RIGHT knee, RIGHT hip and knee pain  EXAM: RIGHT KNEE - COMPLETE 4+ VIEW  COMPARISON:  09/08/2013  FINDINGS: Marked osseous demineralization.  Diffuse joint space narrowing and spur formation.  Tiny chronic RIGHT knee joint effusion.  Patellar spur at quadriceps tendon insertion.  Deformities of the proximal RIGHT tibia and fibula from old healed fractures.  No acute fracture, dislocation, or bone destruction.  IMPRESSION: Osseous demineralization.  Old healed proximal RIGHT tibial and fibular fractures.  Osteoarthritic changes RIGHT knee with small joint effusion.  No definite acute fracture or dislocation identified.   Electronically Signed   By: Ulyses Southward M.D.   On: 01/10/2015 12:38   Dg Hip Operative Unilat With Pelvis Right  01/11/2015   CLINICAL DATA:  Right hip internal fixation.  EXAM: OPERATIVE right HIP (WITH PELVIS IF PERFORMED)  VIEWS  TECHNIQUE: Fluoroscopic spot image(s) were submitted for interpretation post-operatively.  FLUOROSCOPY TIME:  Fluoroscopy Time:  1 min 20 seconds  Number of Acquired Images:  6  COMPARISON:  01/10/2015  FINDINGS: The patient status post open reduction internal fixation of right intertrochanteric hip fracture with good anatomic alignment.  IMPRESSION: Open reduction internal fixation of right intertrochanteric hip fracture with good anatomic alignment.   Electronically Signed   By: Maisie Fus  Register   On:  01/11/2015 11:48   Dg Hip Unilat With Pelvis 2-3 Views Right  01/10/2015   CLINICAL DATA:  Acute right hip pain after fall in shower this morning. Initial encounter.  EXAM: RIGHT HIP (WITH PELVIS) 2-3 VIEWS  COMPARISON:  None.  FINDINGS: Mildly displaced intertrochanteric fracture of the proximal right femur is noted. This appears to be closed and posttraumatic. 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  IMPRESSION: Mildly displaced intertrochanteric fracture of proximal right femur.   Electronically Signed   By: Lupita Raider, M.D.   On: 01/10/2015 12:35    Scheduled Meds: .  ceFAZolin (ANCEF) IV  2 g Intravenous Q6H  . docusate sodium  100 mg Oral BID  . [START ON 01/12/2015] enoxaparin (LOVENOX) injection  30 mg Subcutaneous Q24H  . feeding supplement (RESOURCE BREEZE)  1 Container Oral TID BM  . insulin aspart  0-15 Units Subcutaneous TID WC  . insulin aspart  0-5 Units Subcutaneous QHS  Continuous Infusions: . 0.9 % NaCl with KCl 20 mEq / L 75 mL/hr at 01/11/15 1100    Principal Problem:   Hip fracture Active Problems:   Iron deficiency anemia   Rheumatoid arthritis   Type 2 diabetes mellitus with HbA1C goal below 7.5   Alzheimer's disease   Hypothyroidism   CKD (chronic kidney disease)   Intertrochanteric fracture of right femur    Time spent: 30 minutes    [facility]  [facility] Pager [phone]. If 7PM-7AM, please contact night-coverage at [email or web address], password [facility] 01/11/2015, 4:33 PM  LOS: 1 day

## 2015-01-11 NOTE — Op Note (Signed)
OPERATIVE REPORT  01-11-15  Preop diagnosis closed right intertrochanteric fracture of the hip  Postop diagnosis same  Surgeon Romeo Apple  Assisted by [place]  Anesthesia spinal  Operative findings two-part intertrochanteric fracture with mild displacement  Implant gamma nail, 125 nail, 85 mm lag screw, proximal set screw in sliding mode, distal locking bolt 35 mm.  The patient fell out of a shower chair at a nursing home injured her right hip evaluation in the emergency room showed intertrochanteric fracture she was admitted for stabilization  The patient was brought to the operating suite. Surgical site marking had been performed in preop area. Spinal anesthetic was administered. Antibiotics were started. The patient was placed on the fracture table. The left leg was abducted. The right leg was placed in traction. The C-arm was brought in. X-rays were taken and the leg was manipulated until a stable reduction was confirmed by x-ray.  Sterile prep was performed with DuraPrep and draping was performed sterilely. Timeout was completed.  A spinal needle was used to assess the bony anatomy of the proximal femur. An incision was made at the tip of the trochanter extended proximally and then divided with cautery down to the fascia. The fascia was split in line with the skin incision. Blunt dissection was carried out until the trochanter was palpated. A curved awl was used to enter the femoral canal. A guidewire was passed through the cannulated awl until it reached the knee. Confirmed by x-ray.  The reamer was passed over the guidewire down the level of the lesser trouble.  125 nail was passed over the guidewire the guidewire was removed a second incision was made over the lateral femur subcutaneous tissue was divided down to the fascia and then down to bone the cannula was advanced to bone and a guidewire was placed in the center the femoral head on AP and lateral x-ray.  Guidewire was  measured it measured 82 mm an 85 mm lag screw was chosen and placed over the guidewire. Proximal acorn was placed in sliding mode. The screwdriver on the lag screw was checked and found to be engaged with the proximal locking device.  This cannula was removed and a third stab incision was made over the distal femur, thennula was passed down the bone.  the drill bit was passed and measured screw length off the drill bit. The screw was passed. Radiographs confirmed. Final x-rays confirmed hardware position fracture reduction. Wounds irrigated thoroughly. Proximally the wound was closed in layered fashion with 0 Monocryl and 2-0 Monocryl. Second incision was closed with 2-0 Monocryl. 30 incision was closed with just staples. L1-2 systems incisions were closed with staples well.  Injected Marcaine with epinephrine 30 mL through the 3 wounds.  Sterile dressing applied patient taken recovery in stable condition  Patient weight-bear as tolerated.

## 2015-01-11 NOTE — Anesthesia Preprocedure Evaluation (Signed)
Anesthesia Evaluation  Patient identified by MRN, date of birth, ID band Patient confused    Reviewed: Allergy & Precautions, NPO status , Patient's Chart, lab work & pertinent test results  Airway Mallampati: II  TM Distance: >3 FB     Dental  (+) Edentulous Upper   Pulmonary  breath sounds clear to auscultation        Cardiovascular hypertension, Pt. on medications Rhythm:Regular Rate:Normal     Neuro/Psych PSYCHIATRIC DISORDERS (dementia)    GI/Hepatic negative GI ROS,   Endo/Other  diabetes, Type 2, Oral Hypoglycemic AgentsHypothyroidism   Renal/GU Renal InsufficiencyRenal disease     Musculoskeletal   Abdominal   Peds  Hematology   Anesthesia Other Findings   Reproductive/Obstetrics                             Anesthesia Physical Anesthesia Plan  ASA: III  Anesthesia Plan: Spinal   Post-op Pain Management:    Induction: Intravenous  Airway Management Planned: Simple Face Mask  Additional Equipment:   Intra-op Plan:   Post-operative Plan:   Informed Consent: I have reviewed the patients History and Physical, chart, labs and discussed the procedure including the risks, benefits and alternatives for the proposed anesthesia with the patient or authorized representative who has indicated his/her understanding and acceptance.     Plan Discussed with:   Anesthesia Plan Comments:         Anesthesia Quick Evaluation

## 2015-01-11 NOTE — Anesthesia Postprocedure Evaluation (Signed)
Anesthesia Post Note  Patient: Caitlin Riddle  Procedure(s) Performed: Procedure(s) (LRB): INTERNAL FIXATION RIGHT HIP (Right)  Anesthesia type: Spinal  Patient location: PACU  Post pain: Pain level controlled  Post assessment: Post-op Vital signs reviewed, Patient's Cardiovascular Status Stable, Respiratory Function Stable, Patent Airway, No signs of Nausea or vomiting and Pain level controlled  Last Vitals:  Filed Vitals:   01/11/15 1100  BP: 152/71  Pulse: 81  Temp: 36.9 C  Resp: 18    Post vital signs: Reviewed and stable  Level of consciousness: awake and alert   Complications: No apparent anesthesia complications

## 2015-01-11 NOTE — Brief Op Note (Signed)
01/10/2015 - 01/11/2015  9:30 AM  PATIENT:  Caitlin Riddle  74 y.o. female  PRE-OPERATIVE DIAGNOSIS:  intertrochanteric fracture right hip  POST-OPERATIVE DIAGNOSIS:  intertrochanteric fracture right hip  PROCEDURE:  Procedure(s): INTERNAL FIXATION RIGHT HIP (Right)  SURGEON:  Surgeon(s) and Role:    * Vickki Hearing, MD - Primary  PHYSICIAN ASSISTANT:   ASSISTANTS: BETTY ASHLEY    ANESTHESIA:   spinal  EBL:  Total I/O In: 400 [I.V.:400] Out: 125 [Blood:125]  BLOOD ADMINISTERED:none  DRAINS: none   LOCAL MEDICATIONS USED:  MARCAINE     SPECIMEN:  No Specimen  DISPOSITION OF SPECIMEN:  N/A  COUNTS:  YES  TOURNIQUET:  * No tourniquets in log *  DICTATION: .Dragon Dictation  PLAN OF CARE: Admit to inpatient   PATIENT DISPOSITION:  PACU - hemodynamically stable.   Delay start of Pharmacological VTE agent (>24hrs) due to surgical blood loss or risk of bleeding: yes  GAMMA NAIL SHORT 125 DEGREE  85 LAG SCREW 35 DISTAL LOCKING

## 2015-01-11 NOTE — Progress Notes (Signed)
UR chart review completed.  

## 2015-01-11 NOTE — Progress Notes (Signed)
INITIAL NUTRITION ASSESSMENT   INTERVENTION: Resource Breeze po TID, each supplement provides 250 kcal and 9 grams of protein   Assist with meals when diet is fully advanced if family is not present in order to maximize her meal intake  NUTRITION DIAGNOSIS: Increased protein energy needs related to surgical hip repair as evidenced by estimated nutrient requirments.   Goal: Pt to meet >/= 90% of their estimated nutrition needs    Monitor:  Diet progression, meal completion, and labs  Reason for Assessment:  Malnutrition Screen   74 y.o. female  Admitting Dx: Hip fracture  ASSESSMENT:  Pt is s/p internal fixation of right hip this morning. She is NPO currently. Hx includes DM, Dementia, HTN, CKD stage II.  Her usual diet is Mechanical soft/ low sodium. Endentulous (upper). Family says she eats better when someone is feeding her. They occasionally bring outside food in to her which she eats well (i.e.fried fish). Pt has has decrease in food intake overall  per daughter. Pt has been living at [facility]. Her weight has decreased 5#(5%)  in past 5 months.   Abnormal labs: glucose-209, creat. 1.30 and Cl- 113. Anemia panel pending.  Nutrition Focused Physical Exam:  Subcutaneous Fat:  Orbital Region: moderate loss Upper Arm Region: severe  Thoracic and Lumbar Region: n/a  Muscle:  Temple Region: moderate wasting Clavicle Bone Region: severe wasting Clavicle and Acromion Bone Region: moderate-severe wasting Scapular Bone Region: n/a Dorsal Hand: moderate wasting Patellar Region: severe wasting Anterior Thigh Region: severe wasting  Posterior Calf Region: n/a  Edema: none    Height: Ht Readings from Last 1 Encounters:  01/10/15 5\' 4"  (1.626 m)    Weight: Wt Readings from Last 1 Encounters:  01/10/15 90 lb (40.824 kg)    Ideal Body Weight: 120# (54.5 kg)  % Ideal Body Weight: 75%  Wt Readings from Last 10 Encounters:  01/10/15 90 lb (40.824 kg)  08/11/14 95  lb (43.092 kg)  07/28/14 100 lb (45.36 kg)  06/06/14 94 lb (42.638 kg)  04/02/14 98 lb (44.453 kg)  03/28/14 102 lb (46.267 kg)  01/17/14 100 lb (45.36 kg)  12/28/13 97 lb (43.999 kg)  11/29/13 98 lb (44.453 kg)  11/15/13 98 lb (44.453 kg)    Usual Body Weight: 95-100#  % Usual Body Weight: 95%  BMI:  Body mass index is 15.44 kg/(m^2). underweight  Estimated Nutritional Needs: Kcal: 01/13/14  Protein: 53-57 gr Fluid: 1.2-1.4 liters daily  Skin: closed (staples) surgical incision to right hip  Diet Order:  NPO   EDUCATION NEEDS: -No education needs identified at this time   Intake/Output Summary (Last 24 hours) at 01/11/15 1037 Last data filed at 01/11/15 0905  Gross per 24 hour  Intake 2065.5 ml  Output    125 ml  Net 1940.5 ml    Last BM:  Prior to admission  Labs:   Recent Labs Lab 01/10/15 1252 01/11/15 0601  NA 142 142  K 4.5 4.3  CL 112 113*  CO2 24 22  BUN 19 16  CREATININE 1.64* 1.30*  CALCIUM 8.9 9.0  GLUCOSE 218* 209*    CBG (last 3)   Recent Labs  01/10/15 2124 01/11/15 0631 01/11/15 0924  GLUCAP 111* 185* 140*    Scheduled Meds: .  ceFAZolin (ANCEF) IV  2 g Intravenous Q6H  . docusate sodium  100 mg Oral BID  . [START ON 01/12/2015] enoxaparin (LOVENOX) injection  30 mg Subcutaneous Q24H  . insulin aspart  0-15 Units  Subcutaneous TID WC  . insulin aspart  0-5 Units Subcutaneous QHS    Continuous Infusions: . 0.9 % NaCl with KCl 20 mEq / L      Past Medical History  Diagnosis Date  . Diabetes mellitus   . Hypertension   . Arthritis   . Hyperlipidemia   . Vision loss of left eye   . Arthralgia   . Renal insufficiency   . Dementia   . CKD (chronic kidney disease)     stage II    Past Surgical History  Procedure Laterality Date  . Total abdominal hysterectomy    . Colonoscopy    . Esophagogastroduodenoscopy      Royann Shivers MS,RD,CSG,LDN Office: [phone] Pager: [phone]

## 2015-01-11 NOTE — Progress Notes (Signed)
PT Cancellation Note  Patient Details Name: Caitlin Riddle MRN: [id number] DOB: [birth year]   Cancelled Treatment:    Reason Eval/Treat Not Completed: Patient not medically ready.  Pt is scheduled for surgery today.  We will await post-op orders.   Myrlene Broker L 01/11/2015, 8:15 AM

## 2015-01-12 LAB — BASIC METABOLIC PANEL
ANION GAP: 7 (ref 5–15)
BUN: 17 mg/dL (ref 6–23)
CHLORIDE: 112 mmol/L (ref 96–112)
CO2: 20 mmol/L (ref 19–32)
Calcium: 8.6 mg/dL (ref 8.4–10.5)
Creatinine, Ser: 1.48 mg/dL — ABNORMAL HIGH (ref 0.50–1.10)
GFR calc non Af Amer: 34 mL/min — ABNORMAL LOW (ref >90)
GFR, EST AFRICAN AMERICAN: 40 mL/min — AB (ref >90)
Glucose, Bld: 153 mg/dL — ABNORMAL HIGH (ref 70–99)
POTASSIUM: 4.4 mmol/L (ref 3.5–5.1)
SODIUM: 139 mmol/L (ref 135–145)

## 2015-01-12 LAB — GLUCOSE, CAPILLARY
GLUCOSE-CAPILLARY: 126 mg/dL — AB (ref 70–99)
GLUCOSE-CAPILLARY: 150 mg/dL — AB (ref 70–99)
GLUCOSE-CAPILLARY: 160 mg/dL — AB (ref 70–99)
GLUCOSE-CAPILLARY: 157 mg/dL — AB (ref 70–99)

## 2015-01-12 LAB — CBC
HCT: 37.1 % (ref 36.0–46.0)
Hemoglobin: 12.2 g/dL (ref 12.0–15.0)
MCH: 27.3 pg (ref 26.0–34.0)
MCHC: 32.9 g/dL (ref 30.0–36.0)
MCV: 83 fL (ref 78.0–100.0)
Platelets: 177 10*3/uL (ref 150–400)
RBC: 4.47 MIL/uL (ref 3.87–5.11)
RDW: 15.8 % — ABNORMAL HIGH (ref 11.5–15.5)
WBC: 10.7 10*3/uL — AB (ref 4.0–10.5)

## 2015-01-12 NOTE — Evaluation (Signed)
Physical Therapy Evaluation Patient Details Name: Caitlin Riddle MRN: [id number] DOB: October 07, 1941 Today's Date: 01/12/2015   History of Present Illness  Pt is a 74 y.o. female with past medical history that includes advanced dementia is a resident at assisted [name], diabetes, chronic kidney disease presents to the emergency department with the chief complaint of recent fall and subsequent right hip pain. Initial evaluation reveals a displaced intertrochanteric fracture of the proximal right femur. Information is obtained from the sister who is at the bedside as well as reports from the nursing facility. Today patient was in the shower chair which gave way and she fell onto her right knee. Therefore present and reportedly there was no loss of consciousness and she did not strike her head. Shortly thereafter she complained of pain in that right leg particular with repositioning and bearing weight. Prior to this she was in her usual state of health. There were no reports of any fever chills sick contacts or recent illness. No reports of nausea vomiting diarrhea constipation. Pt had internal fixation on 01/11/15  Clinical Impression  Pt is seen for evaluation/tx.  [name] but minimally verbal or able to follow directions.  She has a mild knee flexion contracture on the right (about 10 degrees) and limited hip abduction bilaterally.  We were able to achieve about 30 degrees right hip flexion while supine.  She required total assist to transfer supine to sit with good sitting balance.  Max assist needed to transfer bed to chair with a standing pivot transfer, WBAT right.    Follow Up Recommendations SNF    Equipment Recommendations  None recommended by PT    Recommendations for Other Services   none    Precautions / Restrictions Precautions Precautions: Fall Restrictions Weight Bearing Restrictions: No RLE Weight Bearing: Weight bearing as tolerated       Mobility  Bed Mobility Overal bed mobility: Needs Assistance Bed Mobility: Supine to Sit     Supine to sit: Total assist;HOB elevated     General bed mobility comments: Not assessed   Transfers Overall transfer level: Needs assistance Equipment used: None Transfers: Stand Pivot Transfers   Stand pivot transfers: Max assist       General transfer comment: pt is able to weight bear on LLE during transfer  Ambulation/Gait Ambulation/Gait assistance:  (unable)                            Balance Overall balance assessment:  (sitting balance is WNL)                                           Pertinent Vitals/Pain Pain Assessment: No/denies pain (at rest)    Home Living Family/patient expects to be discharged to:: Skilled nursing facility                      Prior Function Level of Independence: Needs assistance   Gait / Transfers Assistance Needed: non ambulatory, transfers bed to chair but degree of assist is unknown  ADL's / Homemaking Assistance Needed: Pt requires assistance in all B/IADLs         Hand Dominance   Dominant Hand: Right    Extremity/Trunk Assessment   Upper Extremity Assessment: Defer to OT evaluation       LUE  Deficits / Details: Pt has decreased range of motion in LUE, 50% shoulder range   Lower Extremity Assessment: Generalized weakness;RLE deficits/detail;LLE deficits/detail RLE Deficits / Details: limited hip flexion to 30 degrees, abduction to 10 degrees LLE Deficits / Details: limitation of hip abduction to 30 degrees  Cervical / Trunk Assessment: Kyphotic  Communication   Communication: No difficulties  Cognition Arousal/Alertness: Awake/alert Behavior During Therapy: WFL for tasks assessed/performed Overall Cognitive Status: History of cognitive impairments - at baseline                            Exercises General Exercises - Lower Extremity Ankle Circles/Pumps:  AAROM;PROM;Both;10 reps;Supine Short Arc Quad: PROM;Both;10 reps;Supine Heel Slides: AAROM;PROM;Both;10 reps;Supine Hip ABduction/ADduction: PROM;AAROM;Both;10 reps;Supine      Assessment/Plan    PT Assessment Patient needs continued PT services  PT Diagnosis Generalized weakness;Acute pain   PT Problem List Decreased strength;Decreased range of motion;Decreased activity tolerance;Decreased mobility;Decreased cognition;Pain  PT Treatment Interventions Functional mobility training;Therapeutic exercise   PT Goals (Current goals can be found in the Care Plan section) Acute Rehab PT Goals Patient Stated Goal: none stated PT Goal Formulation: Patient unable to participate in goal setting Time For Goal Achievement: 01/26/15 Potential to Achieve Goals: Fair    Frequency Min 5X/week   Barriers to discharge   none                   End of Session Equipment Utilized During Treatment: Gait belt;Oxygen Activity Tolerance: Patient tolerated treatment well Patient left: in chair;with call bell/phone within reach;with chair alarm set Nurse Communication: Mobility status         Time: 0930-1004 PT Time Calculation (min) (ACUTE ONLY): 34 min   Charges:   PT Evaluation $Initial PT Evaluation Tier I: 1 Procedure           Konrad Penta 01/12/2015, 10:21 AM

## 2015-01-12 NOTE — Addendum Note (Signed)
Addendum  created 01/12/15 1257 by Moshe Salisbury, CRNA   Modules edited: Notes Section   Notes Section:  File: [id number]

## 2015-01-12 NOTE — Clinical Social Work Psychosocial (Signed)
Clinical Social Work Department BRIEF PSYCHOSOCIAL ASSESSMENT 01/12/2015  Patient:  Caitlin Riddle, Caitlin Riddle     Account Number:  [id number]     Admit date:  01/10/2015  Clinical Social Worker:  Nancie Neas  Date/Time:  01/12/2015 03:51 PM  Referred by:  CSW  Date Referred:  01/12/2015 Referred for  SNF Placement   Other Referral:   Interview type:  Family Other interview type:   sister/guardian: Caitlin Riddle    PSYCHOSOCIAL DATA Living Status:  FACILITY Admitted from facility:  [name] Level of care:  Assisted Living Primary support name:  Caitlin Riddle Primary support relationship to patient:  [place] Degree of support available:   supportive    CURRENT CONCERNS Current Concerns  Post-Acute Placement   Other Concerns:    SOCIAL WORK ASSESSMENT / PLAN CSW spoke with pt's sister/legal guardian Caitlin Riddle on phone. [facility] confirmed that Caitlin Riddle is guardian and faxed paperwork which was placed on pt's chart. Caitlin Riddle reports she became guardian in August after DSS became involved due to concern with pt's husband. Pt was placed at [facility] at that time. She was diagnosed with dementia several years ago. Pt had a fall in the shower witnessed by staff there and has hip fracture. Pt had surgery yesterday. Caitlin Riddle indicates that she is very close with her sister and sees pt almost every day. CSW discussed d/c plans with Caitlin Riddle. She was aware that pt would require SNF and would be comfortable with [facility] as pt was there several years ago. SNF list left in room. Caitlin Riddle feels that pt will adjust fine to new environment as long as her sister continues to visit frequently.   Assessment/plan status:  Psychosocial Support/Ongoing Assessment of Needs Other assessment/ plan:   Information/referral to community resources:   SNF list    PATIENT'S/FAMILY'S RESPONSE TO PLAN OF CARE: Caitlin Riddle plans to be back to visit pt in morning and will plan to discuss SNF further with CSW.        Derenda Fennel, [place] [phone]

## 2015-01-12 NOTE — Anesthesia Postprocedure Evaluation (Signed)
  Anesthesia Post-op Note  Patient: Caitlin Riddle  Procedure(s) Performed: Procedure(s): INTERNAL FIXATION RIGHT HIP (Right)  Patient Location: Nursing Unit  Anesthesia Type:Spinal  Level of Consciousness: awake and alert , mental status returned to preop level.  Airway and Oxygen Therapy: Patient Spontanous Breathing  Post-op Pain: mild  Post-op Assessment: Post-op Vital signs reviewed, Patient's Cardiovascular Status Stable, Respiratory Function Stable, Patent Airway and No signs of Nausea or vomiting  Post-op Vital Signs: Reviewed and stable  Last Vitals:  Filed Vitals:   01/12/15 1200  BP:   Pulse:   Temp:   Resp: 20    Complications: No apparent anesthesia complications

## 2015-01-12 NOTE — Clinical Social Work Placement (Signed)
Clinical Social Work Department CLINICAL SOCIAL WORK PLACEMENT NOTE 01/12/2015  Patient:  Caitlin Riddle, Caitlin Riddle  Account Number:  [id number] Admit date:  01/10/2015  Clinical Social Worker:  Derenda Fennel, LCSW  Date/time:  01/12/2015 03:50 PM  Clinical Social Work is seeking post-discharge placement for this patient at the following level of care:   SKILLED NURSING   (*CSW will update this form in Epic as items are completed)   01/12/2015  Patient/family provided with [facility] Department of Clinical Social Work's list of facilities offering this level of care within the geographic area requested by the patient (or if unable, by the patient's family).  01/12/2015  Patient/family informed of their freedom to choose among providers that offer the needed level of care, that participate in Medicare, Medicaid or managed care program needed by the patient, have an available bed and are willing to accept the patient.  01/12/2015  Patient/family informed of [facility]' ownership interest in [facility], as well as of the fact that they are under no obligation to receive care at this facility.  PASARR submitted to [employer] on 01/12/2015 PASARR number received on 01/12/2015  FL2 transmitted to all facilities in geographic area requested by pt/family on  01/12/2015 FL2 transmitted to all facilities within larger geographic area on   Patient informed that his/her managed care company has contracts with or will negotiate with  certain facilities, including the following:     Patient/family informed of bed offers received:   Patient chooses bed at  Physician recommends and patient chooses bed at    Patient to be transferred to  on   Patient to be transferred to facility by  Patient and family notified of transfer on  Name of family member notified:    The following physician request were entered in Epic:   Additional Comments:  Derenda Fennel, LCSW [phone]

## 2015-01-12 NOTE — Progress Notes (Signed)
Physical Therapy Treatment Patient Details Name: Caitlin Riddle MRN: [id number] DOB: 04-25-1941 Today's Date: 01/12/2015    History of Present Illness Pt is a 74 y.o. female with past medical history that includes advanced dementia is a resident at assisted [name], diabetes, chronic kidney disease presents to the emergency department with the chief complaint of recent fall and subsequent right hip pain. Initial evaluation reveals a displaced intertrochanteric fracture of the proximal right femur. Information is obtained from the sister who is at the bedside as well as reports from the nursing facility. Today patient was in the shower chair which gave way and she fell onto her right knee. Therefore present and reportedly there was no loss of consciousness and she did not strike her head. Shortly thereafter she complained of pain in that right leg particular with repositioning and bearing weight. Prior to this she was in her usual state of health. There were no reports of any fever chills sick contacts or recent illness. No reports of nausea vomiting diarrhea constipation. Pt had internal fixation on 01/11/15    PT Comments    Pt tolerated sitting in recliner well today.  Several attempts at sit to stand with a walker were made but pt was completely unable to make lift off of chair.  She was able to transfer with full therapist assist in pivot transfer from chair to bed.  She needed total assist to transfer sit to supine.  Sister is present who states that pt is actually able to ambulate with a walker independently PTA.  I am not going to ammend our goals though as pt is requiring so much assist with transfers.  Follow Up Recommendations  SNF     Equipment Recommendations  None recommended by PT    Recommendations for Other Services  none     Precautions / Restrictions Precautions Precautions: Fall Restrictions Weight Bearing Restrictions: No RLE Weight Bearing: Weight bearing as  tolerated    Mobility  Bed Mobility Overal bed mobility: Needs Assistance Bed Mobility: Sit to Supine     Supine to sit: Total assist;HOB elevated Sit to supine: Total assist   General bed mobility comments: pt unable to assist in any aspect of mobility  Transfers Overall transfer level: Needs assistance Equipment used: Rolling walker (2 wheeled) Transfers: Sit to/from [employer] Sit to Stand: Total assist Stand pivot transfers: Max assist       General transfer comment: pt was unable to assume stance from seated position with walker  Ambulation/Gait Ambulation/Gait assistance:  (unable)                                 Balance Overall balance assessment:  (sitting balance is WNL)                                  Cognition Arousal/Alertness: Awake/alert Behavior During Therapy: WFL for tasks assessed/performed Overall Cognitive Status: History of cognitive impairments - at baseline                      Exercises General Exercises - Lower Extremity Ankle Circles/Pumps: AAROM;PROM;Both;10 reps;Supine Short Arc Quad: PROM;Both;10 reps;Supine Heel Slides: AAROM;PROM;Both;10 reps;Supine Hip ABduction/ADduction: PROM;AAROM;Both;10 reps;Supine            Pertinent Vitals/Pain Pain Assessment: No/denies pain    Home Living Family/patient expects to  be discharged to:: Skilled nursing facility                    Prior Function Level of Independence: Needs assistance  Gait / Transfers Assistance Needed: non ambulatory, transfers bed to chair but degree of assist is unknown ADL's / Homemaking Assistance Needed: Pt requires assistance in all B/IADLs      PT Goals (current goals can now be found in the care plan section) Acute Rehab PT Goals Patient Stated Goal: none stated PT Goal Formulation: Patient unable to participate in goal setting Time For Goal Achievement: 01/26/15 Potential to Achieve Goals:  Fair Progress towards PT goals: Progressing toward goals    Frequency  Min 5X/week    PT Plan Current plan remains appropriate                 End of Session Equipment Utilized During Treatment: Gait belt;Oxygen Activity Tolerance: Patient limited by fatigue Patient left: in bed;with call bell/phone within reach;with bed alarm set     Time: 0998-3382 PT Time Calculation (min) (ACUTE ONLY): 38 min  Charges:  $Therapeutic Activity: 23-37 mins                    G Codes:      Myrlene Broker L 2015/01/29, 1:30 PM

## 2015-01-12 NOTE — Evaluation (Signed)
Occupational Therapy Evaluation Patient Details Name: Caitlin Riddle MRN: [id number] DOB: 09/01/1941 Today's Date: 01/12/2015    History of Present Illness Pt is a 74 y.o. female with past medical history that includes advanced dementia is a resident at assisted [name], diabetes, chronic kidney disease presents to the emergency department with the chief complaint of recent fall and subsequent right hip pain. Initial evaluation reveals a displaced intertrochanteric fracture of the proximal right femur. Information is obtained from the sister who is at the bedside as well as reports from the nursing facility. Today patient was in the shower chair which gave way and she fell onto her right knee. Therefore present and reportedly there was no loss of consciousness and she did not strike her head. Shortly thereafter she complained of pain in that right leg particular with repositioning and bearing weight. Prior to this she was in her usual state of health. There were no reports of any fever chills sick contacts or recent illness. No reports of nausea vomiting diarrhea constipation. Pt had internal fixation on 01/11/15   Clinical Impression   PTA pt lived at ALF, receiving assistance with B/IADLs. Pt awake, alert, sitting up in bed this am. Pt demonstrates decreased range of motion  In LUE, 50%, decreased strength in BUE, 3+/5. Pt has history of dementia at baseline, unable to give accurate history of level of assistance, no family/caregivers present this am. Pt does not remember having surgery yesterday. Pt able to follow directions and communicates well. Pt able to feed self with supervision/set-up, requiring verbal cuing for initiation of task. Pt would benefit from SNF, recommend OT eval at SNF to determine baseline functioning and level of independence in B/IADL tasks.     Follow Up Recommendations  SNF;Supervision/Assistance - 24 hour    Equipment Recommendations  None recommended by OT        Precautions / Restrictions Precautions Precautions: Fall      Mobility Bed Mobility               General bed mobility comments: Not assessed   Transfers                 General transfer comment: Not assessed    Balance                                            ADL Overall ADL's : Needs assistance/impaired;At baseline Eating/Feeding: Supervision/ safety                                     General ADL Comments: Pt lives at ALF, receives assistance in all B/IADLs. Pt able to feed self, with cues to initiate task.                Pertinent Vitals/Pain Pain Assessment: No/denies pain     Hand Dominance Right   Extremity/Trunk Assessment Upper Extremity Assessment Upper Extremity Assessment: LUE deficits/detail;Generalized weakness LUE Deficits / Details: Pt has decreased range of motion in LUE, 50% shoulder range   Lower Extremity Assessment Lower Extremity Assessment: Defer to PT evaluation       Communication Communication Communication: No difficulties   Cognition Arousal/Alertness: Awake/alert Behavior During Therapy: WFL for tasks assessed/performed Overall Cognitive Status: History of cognitive impairments - at baseline  Home Living Family/patient expects to be discharged to:: Skilled nursing facility                                        Prior Functioning/Environment Level of Independence: Needs assistance    ADL's / Homemaking Assistance Needed: Pt requires assistance in all B/IADLs         OT Diagnosis: Generalized weakness;Cognitive deficits   OT Problem List: Decreased strength;Decreased range of motion;Decreased cognition;Decreased safety awareness;Decreased knowledge of precautions    End of Session    Activity Tolerance: Patient tolerated treatment well Patient left: in bed;with call bell/phone within reach;with bed  alarm set   Time: 7425-9563 OT Time Calculation (min): 27 min Charges:  OT General Charges $OT Visit: 1 Procedure OT Evaluation $Initial OT Evaluation Tier I: 1 Procedure  Ezra Sites, OTR/L  [phone]  01/12/2015, 9:55 AM

## 2015-01-12 NOTE — Progress Notes (Addendum)
[facility] PROGRESS NOTE  [place] Joo NOM:[id number] DOB: 1941/01/21 DOA: 01/10/2015 PCP: Lilyan Punt, MD  Assessment/Plan: Hip fracture; as a result of a mechanical fall in the shower. Surgery 01/11/15. Good pain control. 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 Chart review indicates creatinine ranges 1.35-1.48. Currently creatinine is 1.48. She is incontinent but reportedly many episodes urine. monitor   Iron deficiency anemia: s/p 2 units PRBC's 01/10/15. Per chart review and recent PCP note as well as anemia of chronic disease given her rheumatoid arthritis.anemia panel with RBC 3.6 saturation ratio 19 otherwise unremarkable. Chart review indicates a Hg baseline range 9.5-10.5. monitor   Rheumatoid arthritis: Stable at baseline. PCP note indicates not a candidate for IV therapy due to #2.   Type 2 diabetes mellitus with HbA1C goal below 7.5: Hemoglobin A1c 6.3. She was recently taken off metformin due to #2. CBG range 126-175.   Alzheimer's disease: Sr. states she's currently at baseline. She is incontinent most of the time, is unable to make her wants and needs known, sometimes requires feeding but always requires encouragement to eat.   Hypothyroidism: 2 months ago TSH 2.35 down from 12.2 one year ago per chart review. Will continue her Synthroid.   Code Status: full Family Communication: none present Disposition Plan: snf hopefully tomorrow   Consultants:  ortho  Procedures:  repair of Hip fx 01/11/15  Antibiotics:    HPI/Subjective: Sitting up eating and smiling. Reports good pain control  Objective: Filed Vitals:   01/12/15 0800  BP:   Pulse:   Temp:   Resp: 16    Intake/Output Summary (Last 24 hours) at 01/12/15 0904 Last data filed at 01/11/15 1830  Gross per 24 hour  Intake    460 ml  Output      0 ml  Net    460 ml   Filed Weights   01/10/15 1023 01/10/15 1710  Weight:  43.092 kg (95 lb) 40.824 kg (90 lb)    Exam:   General:  Appears comfortable  Cardiovascular: RRR no MGR no LE edema  Respiratory: normal effort BS clear to auscultation no wheeze  Abdomen: non-distended +BS  Musculoskeletal: dressing right hip clean dry and intact. Right foot warm to touch   Data Reviewed: Basic Metabolic Panel:  Recent Labs Lab 01/10/15 1252 01/11/15 0601 01/12/15 0605  NA 142 142 139  K 4.5 4.3 4.4  CL 112 113* 112  CO2 24 22 20   GLUCOSE 218* 209* 153*  BUN 19 16 17   CREATININE 1.64* 1.30* 1.48*  CALCIUM 8.9 9.0 8.6   Liver Function Tests: No results for input(s): AST, ALT, ALKPHOS, BILITOT, PROT, ALBUMIN in the last 168 hours. No results for input(s): LIPASE, AMYLASE in the last 168 hours. No results for input(s): AMMONIA in the last 168 hours. CBC:  Recent Labs Lab 01/10/15 1252 01/11/15 0601 01/12/15 0605  WBC 10.7* 11.1* 10.7*  HGB 9.5* 12.9 12.2  HCT 28.9* 37.5 37.1  MCV 80.5 82.6 83.0  PLT 261 214 177   Cardiac Enzymes: No results for input(s): CKTOTAL, CKMB, CKMBINDEX, TROPONINI in the last 168 hours. BNP (last 3 results)  Recent Labs  01/10/15 1252  BNP 106.0*    ProBNP (last 3 results) No results for input(s): PROBNP in the last 8760 hours.  CBG:  Recent Labs Lab 01/11/15 0924 01/11/15 1101 01/11/15 1639 01/11/15 2313 01/12/15 0750  GLUCAP 140* 105* 202* 175* 126*    Recent Results (from  the past 240 hour(s))  Surgical pcr screen     Status: None   Collection Time: 01/10/15  5:30 PM  Result Value Ref Range Status   MRSA, PCR NEGATIVE NEGATIVE Final   Staphylococcus aureus NEGATIVE NEGATIVE Final    Comment:        The Xpert SA Assay (FDA approved for NASAL specimens in patients over 20 years of age), is one component of a comprehensive surveillance program.  Test performance has been validated by [facility] for patients greater than or equal to 5 year old. It is not intended to diagnose infection  nor to guide or monitor treatment.      Studies: Dg Chest Port 1 View  01/10/2015   CLINICAL DATA:  Fall.  Hypertension.  EXAM: PORTABLE CHEST - 1 VIEW  COMPARISON:  None.  FINDINGS: The heart size and mediastinal contours are within normal limits. Both lungs are clear. No pneumothorax or pleural effusion is noted. The visualized skeletal structures are unremarkable.  IMPRESSION: No active disease.   Electronically Signed   By: Lupita Raider, M.D.   On: 01/10/2015 13:46   Dg Knee Complete 4 Views Right  01/10/2015   CLINICAL DATA:  Larey Seat this morning in shower when shower chair gave way, landed on RIGHT knee, RIGHT hip and knee pain  EXAM: RIGHT KNEE - COMPLETE 4+ VIEW  COMPARISON:  09/08/2013  FINDINGS: Marked osseous demineralization.  Diffuse joint space narrowing and spur formation.  Tiny chronic RIGHT knee joint effusion.  Patellar spur at quadriceps tendon insertion.  Deformities of the proximal RIGHT tibia and fibula from old healed fractures.  No acute fracture, dislocation, or bone destruction.  IMPRESSION: Osseous demineralization.  Old healed proximal RIGHT tibial and fibular fractures.  Osteoarthritic changes RIGHT knee with small joint effusion.  No definite acute fracture or dislocation identified.   Electronically Signed   By: Ulyses Southward M.D.   On: 01/10/2015 12:38   Dg Hip Operative Unilat With Pelvis Right  01/11/2015   CLINICAL DATA:  Right hip internal fixation.  EXAM: OPERATIVE right HIP (WITH PELVIS IF PERFORMED)  VIEWS  TECHNIQUE: Fluoroscopic spot image(s) were submitted for interpretation post-operatively.  FLUOROSCOPY TIME:  Fluoroscopy Time:  1 min 20 seconds  Number of Acquired Images:  6  COMPARISON:  01/10/2015  FINDINGS: The patient status post open reduction internal fixation of right intertrochanteric hip fracture with good anatomic alignment.  IMPRESSION: Open reduction internal fixation of right intertrochanteric hip fracture with good anatomic alignment.    Electronically Signed   By: Maisie Fus  Register   On: 01/11/2015 11:48   Dg Hip Unilat With Pelvis 2-3 Views Right  01/10/2015   CLINICAL DATA:  Acute right hip pain after fall in shower this morning. Initial encounter.  EXAM: RIGHT HIP (WITH PELVIS) 2-3 VIEWS  COMPARISON:  None.  FINDINGS: Mildly displaced intertrochanteric fracture of the proximal right femur is noted. This appears to be closed and posttraumatic. 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  IMPRESSION: Mildly displaced intertrochanteric fracture of proximal right femur.   Electronically Signed   By: Lupita Raider, M.D.   On: 01/10/2015 12:35    Scheduled Meds: . docusate sodium  100 mg Oral BID  . enoxaparin (LOVENOX) injection  30 mg Subcutaneous Q24H  . feeding supplement (RESOURCE BREEZE)  1 Container Oral TID BM  . insulin aspart  0-15 Units Subcutaneous TID WC  . insulin aspart  0-5 Units Subcutaneous QHS   Continuous  Infusions: . 0.9 % NaCl with KCl 20 mEq / L 75 mL/hr at 01/12/15 6168    Principal Problem:   Closed right hip fracture Active Problems:   Iron deficiency anemia   Rheumatoid arthritis   Type 2 diabetes mellitus with HbA1C goal below 7.5   Alzheimer's disease   Hypothyroidism   CKD (chronic kidney disease) stage 3, GFR 30-59 ml/min   Intertrochanteric fracture of right femur   Fall    Time spent: 30 minutes    [facility] M  [facility] Pager [phone]. If 7PM-7AM, please contact night-coverage at [email or web address], password [facility] 01/12/2015, 9:04 AM  LOS: 2 days    Attending note:  Patient seen and examined. Above note reviewed.  Pain appears to be reasonably controlled. Working with physical therapy today. Will need SNF placement. Anticipate discharge soon.  MEMON,JEHANZEB

## 2015-01-13 DIAGNOSIS — S72146A Nondisplaced intertrochanteric fracture of unspecified femur, initial encounter for closed fracture: Secondary | ICD-10-CM | POA: Diagnosis not present

## 2015-01-13 DIAGNOSIS — R41841 Cognitive communication deficit: Secondary | ICD-10-CM | POA: Diagnosis not present

## 2015-01-13 DIAGNOSIS — N183 Chronic kidney disease, stage 3 (moderate): Secondary | ICD-10-CM | POA: Diagnosis not present

## 2015-01-13 DIAGNOSIS — M6281 Muscle weakness (generalized): Secondary | ICD-10-CM | POA: Diagnosis not present

## 2015-01-13 DIAGNOSIS — E119 Type 2 diabetes mellitus without complications: Secondary | ICD-10-CM | POA: Diagnosis not present

## 2015-01-13 DIAGNOSIS — R197 Diarrhea, unspecified: Secondary | ICD-10-CM | POA: Diagnosis not present

## 2015-01-13 DIAGNOSIS — D649 Anemia, unspecified: Secondary | ICD-10-CM | POA: Diagnosis not present

## 2015-01-13 DIAGNOSIS — S72001D Fracture of unspecified part of neck of right femur, subsequent encounter for closed fracture with routine healing: Secondary | ICD-10-CM | POA: Diagnosis not present

## 2015-01-13 DIAGNOSIS — D509 Iron deficiency anemia, unspecified: Secondary | ICD-10-CM | POA: Diagnosis not present

## 2015-01-13 DIAGNOSIS — E039 Hypothyroidism, unspecified: Secondary | ICD-10-CM

## 2015-01-13 DIAGNOSIS — M2550 Pain in unspecified joint: Secondary | ICD-10-CM | POA: Diagnosis not present

## 2015-01-13 DIAGNOSIS — E559 Vitamin D deficiency, unspecified: Secondary | ICD-10-CM | POA: Diagnosis not present

## 2015-01-13 DIAGNOSIS — H5462 Unqualified visual loss, left eye, normal vision right eye: Secondary | ICD-10-CM | POA: Diagnosis not present

## 2015-01-13 DIAGNOSIS — M069 Rheumatoid arthritis, unspecified: Secondary | ICD-10-CM | POA: Diagnosis not present

## 2015-01-13 DIAGNOSIS — Z79899 Other long term (current) drug therapy: Secondary | ICD-10-CM | POA: Diagnosis not present

## 2015-01-13 DIAGNOSIS — S72141D Displaced intertrochanteric fracture of right femur, subsequent encounter for closed fracture with routine healing: Secondary | ICD-10-CM | POA: Diagnosis not present

## 2015-01-13 DIAGNOSIS — G309 Alzheimer's disease, unspecified: Secondary | ICD-10-CM | POA: Diagnosis not present

## 2015-01-13 DIAGNOSIS — R269 Unspecified abnormalities of gait and mobility: Secondary | ICD-10-CM | POA: Diagnosis not present

## 2015-01-13 DIAGNOSIS — Z9181 History of falling: Secondary | ICD-10-CM | POA: Diagnosis not present

## 2015-01-13 DIAGNOSIS — I10 Essential (primary) hypertension: Secondary | ICD-10-CM | POA: Diagnosis not present

## 2015-01-13 DIAGNOSIS — E785 Hyperlipidemia, unspecified: Secondary | ICD-10-CM | POA: Diagnosis not present

## 2015-01-13 DIAGNOSIS — F0280 Dementia in other diseases classified elsewhere without behavioral disturbance: Secondary | ICD-10-CM | POA: Diagnosis not present

## 2015-01-13 LAB — CBC
HEMATOCRIT: 34 % — AB (ref 36.0–46.0)
HEMOGLOBIN: 11.5 g/dL — AB (ref 12.0–15.0)
MCH: 27.8 pg (ref 26.0–34.0)
MCHC: 33.8 g/dL (ref 30.0–36.0)
MCV: 82.3 fL (ref 78.0–100.0)
Platelets: 197 10*3/uL (ref 150–400)
RBC: 4.13 MIL/uL (ref 3.87–5.11)
RDW: 16.4 % — AB (ref 11.5–15.5)
WBC: 11.9 10*3/uL — AB (ref 4.0–10.5)

## 2015-01-13 LAB — GLUCOSE, CAPILLARY
Glucose-Capillary: 118 mg/dL — ABNORMAL HIGH (ref 70–99)
Glucose-Capillary: 286 mg/dL — ABNORMAL HIGH (ref 70–99)

## 2015-01-13 LAB — BASIC METABOLIC PANEL
ANION GAP: 6 (ref 5–15)
BUN: 14 mg/dL (ref 6–23)
CALCIUM: 8.8 mg/dL (ref 8.4–10.5)
CO2: 21 mmol/L (ref 19–32)
Chloride: 113 mmol/L — ABNORMAL HIGH (ref 96–112)
Creatinine, Ser: 1.34 mg/dL — ABNORMAL HIGH (ref 0.50–1.10)
GFR, EST AFRICAN AMERICAN: 45 mL/min — AB (ref >90)
GFR, EST NON AFRICAN AMERICAN: 39 mL/min — AB (ref >90)
Glucose, Bld: 125 mg/dL — ABNORMAL HIGH (ref 70–99)
POTASSIUM: 4.4 mmol/L (ref 3.5–5.1)
SODIUM: 140 mmol/L (ref 135–145)

## 2015-01-13 MED ORDER — ENOXAPARIN SODIUM 30 MG/0.3ML ~~LOC~~ SOLN: SUBCUTANEOUS | Status: DC

## 2015-01-13 MED ORDER — BOOST / RESOURCE BREEZE PO LIQD: 1.0000 | Freq: Three times a day (TID) | ORAL | Status: DC

## 2015-01-13 MED ORDER — METOPROLOL SUCCINATE ER 25 MG PO TB24
12.5000 mg | ORAL_TABLET | Freq: Every day | ORAL | Status: DC
  Administered 2015-01-13: 12.5 mg via ORAL
  Filled 2015-01-13: qty 1

## 2015-01-13 MED ORDER — HYDROCODONE-ACETAMINOPHEN 5-325 MG PO TABS: 1.0000 | ORAL_TABLET | Freq: Four times a day (QID) | ORAL | Status: DC | PRN

## 2015-01-13 MED ORDER — METOPROLOL SUCCINATE ER 25 MG PO TB24: 12.5000 mg | ORAL_TABLET | Freq: Every day | ORAL | Status: DC

## 2015-01-13 MED ORDER — TRAMADOL HCL 50 MG PO TABS: 50.0000 mg | ORAL_TABLET | Freq: Two times a day (BID) | ORAL | Status: DC

## 2015-01-13 NOTE — Progress Notes (Signed)
NURSING PROGRESS NOTE  EDDYE BROXTERMAN [id number] Discharge Data: 01/13/2015 4:48 PM Attending Provider: No att. providers found NPY:YFRTMY,TRZNB, MD   [facility] to be D/C'd Skilled nursing facility per MD order.    All IV's discontinued and monitored for bleeding.  All belongings returned to patient for patient to take home.  Report called to Dina at [facility].  Patient left floor via wheelchair, escorted by two NTs.  Last Documented Vital Signs:  Blood pressure 154/79, pulse 109, temperature 99.8 F (37.7 C), temperature source Oral, resp. rate 18, height 5\' 4"  (1.626 m), weight 40.824 kg (90 lb), SpO2 100 %.  D

## 2015-01-13 NOTE — Clinical Social Work Placement (Signed)
Clinical Social Work Department CLINICAL SOCIAL WORK PLACEMENT NOTE 01/13/2015  Patient:  Caitlin Riddle, Caitlin Riddle  Account Number:  [id number] Admit date:  01/10/2015  Clinical Social Worker:  Derenda Fennel, LCSW  Date/time:  01/12/2015 03:50 PM  Clinical Social Work is seeking post-discharge placement for this patient at the following level of care:   SKILLED NURSING   (*CSW will update this form in Epic as items are completed)   01/12/2015  Patient/family provided with [facility] Department of Clinical Social Work's list of facilities offering this level of care within the geographic area requested by the patient (or if unable, by the patient's family).  01/12/2015  Patient/family informed of their freedom to choose among providers that offer the needed level of care, that participate in Medicare, Medicaid or managed care program needed by the patient, have an available bed and are willing to accept the patient.  01/12/2015  Patient/family informed of [facility]' ownership interest in [facility], as well as of the fact that they are under no obligation to receive care at this facility.  PASARR submitted to EDS on 01/12/2015 PASARR number received on 01/12/2015  FL2 transmitted to all facilities in geographic area requested by pt/family on  01/12/2015 FL2 transmitted to all facilities within larger geographic area on   Patient informed that his/her managed care company has contracts with or will negotiate with  certain facilities, including the following:     Patient/family informed of bed offers received:  01/13/2015 Patient chooses bed at [facility] [place] Physician recommends and patient chooses bed at    Patient to be transferred to [facility] [place] on  01/13/2015 Patient to be transferred to facility by sister Patient and family notified of transfer on 01/13/2015 Name of family member notified:  Venita Sheffield- sister/guardian  The following physician request were  entered in Epic:   Additional Comments:  Derenda Fennel, LCSW [phone]

## 2015-01-13 NOTE — Progress Notes (Addendum)
CONSULT NOTE HIP INSTRUCTIONS   [facility]   28 DAYS OF LOVENOX  F/U 2 WEEKS OFFICE   STAPLES OUT POD 14

## 2015-01-13 NOTE — Progress Notes (Signed)
Physical Therapy Treatment Patient Details Name: Caitlin Riddle MRN: [id number] DOB: June 18, 1941 Today's Date: 01/13/2015    History of Present Illness Pt is a 74 y.o. female with past medical history that includes advanced dementia is a resident at assisted living [name], diabetes, chronic kidney disease presents to the emergency department with the chief complaint of recent fall and subsequent right hip pain. Initial evaluation reveals a displaced intertrochanteric fracture of the proximal right femur. Information is obtained from the sister who is at the bedside as well as reports from the nursing facility. Today patient was in the shower chair which gave way and she fell onto her right knee. Therefore present and reportedly there was no loss of consciousness and she did not strike her head. Shortly thereafter she complained of pain in that right leg particular with repositioning and bearing weight. Prior to this she was in her usual state of health. There were no reports of any fever chills sick contacts or recent illness. No reports of nausea vomiting diarrhea constipation. Pt had internal fixation on 01/11/15    PT Comments    Pt is completely disoriented to place or situation.  She is slightly more resistant to doing very mild exercise which I thought was related to pain but in fact she did not allow any ankle pumps.  She was able to tolerate very gentle hip flexion and abduction but was unable to assist with any.  She continues to require total assist to transfer supine to sit and has good sitting balance.  Another attempt was made to transfer to a walker but she was unable to assist in any way.  She required max assist to transfer bed to chair.  She appears to be quite comfortable in recliner.  Progress will be slow.  Follow Up Recommendations  SNF     Equipment Recommendations  None recommended by PT    Recommendations for Other Services  none     Precautions / Restrictions  Precautions Precautions: Fall Restrictions Weight Bearing Restrictions: Yes RLE Weight Bearing: Weight bearing as tolerated    Mobility  Bed Mobility Overal bed mobility: Needs Assistance Bed Mobility: Sit to Supine     Supine to sit: Total assist;HOB elevated Sit to supine: Total assist   General bed mobility comments: pt unable to assist in any aspect of mobility  Transfers Overall transfer level: Needs assistance Equipment used: Rolling walker (2 wheeled)   Sit to Stand: Total assist Stand pivot transfers: Max assist       General transfer comment: pt was unable to assume stance from seated position with walker  Ambulation/Gait Ambulation/Gait assistance:  (unable)               Stairs            Wheelchair Mobility    Modified Rankin (Stroke Patients Only)       Balance Overall balance assessment:  (sitting balance is WNL)                                  Cognition Arousal/Alertness: Awake/alert Behavior During Therapy: WFL for tasks assessed/performed Overall Cognitive Status: History of cognitive impairments - at baseline                      Exercises General Exercises - Lower Extremity Ankle Circles/Pumps: AAROM;PROM;Both;5 reps;Supine Heel Slides: PROM;Both;10 reps;Supine Hip ABduction/ADduction: PROM;Both;10 reps;Supine  General Comments        Pertinent Vitals/Pain Pain Assessment: No/denies pain (at rest)    Home Living                      Prior Function            PT Goals (current goals can now be found in the care plan section) Progress towards PT goals: Progressing toward goals    Frequency  Min 5X/week    PT Plan Current plan remains appropriate    Co-evaluation             End of Session Equipment Utilized During Treatment: Gait belt;Oxygen Activity Tolerance: Patient limited by fatigue Patient left: in chair;with chair alarm set;with family/visitor present;with call  bell/phone within reach     Time: 0935-1000 PT Time Calculation (min) (ACUTE ONLY): 25 min  Charges:  $Therapeutic Exercise: 8-22 mins $Therapeutic Activity: 8-22 mins                    G Codes:      Konrad Penta 01/18/2015, 10:05 AM

## 2015-01-13 NOTE — Clinical Social Work Note (Signed)
Pt d/c today to SNF. [facility] unable to offer bed which was preference. Caitlin Riddle accepts bed at [facility] and will provide transport. Facility notified. CSW will fax d/c summary upon completion.  Derenda Fennel, [place] [phone]

## 2015-01-13 NOTE — Progress Notes (Signed)
Physical Therapy Treatment Patient Details Name: Caitlin Riddle MRN: [id number] DOB: Sep 08, 1941 Today's Date: 01/13/2015    History of Present Illness Pt is a 74 y.o. female with past medical history that includes advanced dementia is a resident at assisted [name], diabetes, chronic kidney disease presents to the emergency department with the chief complaint of recent fall and subsequent right hip pain. Initial evaluation reveals a displaced intertrochanteric fracture of the proximal right femur. Information is obtained from the sister who is at the bedside as well as reports from the nursing facility. Today patient was in the shower chair which gave way and she fell onto her right knee. Therefore present and reportedly there was no loss of consciousness and she did not strike her head. Shortly thereafter she complained of pain in that right leg particular with repositioning and bearing weight. Prior to this she was in her usual state of health. There were no reports of any fever chills sick contacts or recent illness. No reports of nausea vomiting diarrhea constipation. Pt had internal fixation on 01/11/15    PT Comments    Pt tolerated sitting quite well.  We again attempted sit to stand using a walker and she is still unable to achieve stance.  Max  Assist needed to pivot transfer chair to bed.  Pt remains very cooperative and pain is well controlled.  Follow Up Recommendations  SNF     Equipment Recommendations  None recommended by PT    Recommendations for Other Services  none     Precautions / Restrictions Precautions Precautions: Fall Restrictions Weight Bearing Restrictions: Yes RLE Weight Bearing: Weight bearing as tolerated    Mobility  Bed Mobility Overal bed mobility: Needs Assistance Bed Mobility: Sit to Supine     Supine to sit: Total assist;HOB elevated Sit to supine: Mod assist   General bed mobility comments: pt now able to transfer her trunk to  supine with full assist of LEs  Transfers Overall transfer level: Needs assistance Equipment used: Rolling walker (2 wheeled) Transfers: Sit to/from [employer] Sit to Stand: Total assist Stand pivot transfers: Max assist       General transfer comment: pt was unable to assume stance from seated position with walker  Ambulation/Gait Ambulation/Gait assistance:  (unable)               Stairs            Wheelchair Mobility    Modified Rankin (Stroke Patients Only)       Balance Overall balance assessment:  (sitting balance is WNL)                                  Cognition Arousal/Alertness: Awake/alert Behavior During Therapy: WFL for tasks assessed/performed Overall Cognitive Status: History of cognitive impairments - at baseline                      Exercises General Exercises - Lower Extremity Ankle Circles/Pumps: AAROM;PROM;Both;5 reps;Supine Heel Slides: PROM;Both;10 reps;Supine Hip ABduction/ADduction: PROM;Both;10 reps;Supine    General Comments        Pertinent Vitals/Pain Pain Assessment: No/denies pain    Home Living                      Prior Function            PT Goals (current goals can now be found  in the care plan section) Progress towards PT goals: Progressing toward goals    Frequency  Min 5X/week    PT Plan Current plan remains appropriate    Co-evaluation             End of Session Equipment Utilized During Treatment: Gait belt Activity Tolerance: Patient tolerated treatment well Patient left: in bed;with call bell/phone within reach;with bed alarm set;with nursing/sitter in room     Time: 1245-1306 PT Time Calculation (min) (ACUTE ONLY): 21 min  Charges:  $Therapeutic Exercise: 8-22 mins $Therapeutic Activity: 8-22 mins                    G Codes:      Myrlene Broker L 01/18/2015, 1:10 PM

## 2015-01-13 NOTE — Discharge Summary (Signed)
Physician Discharge Summary  [place] SJG:[id number] DOB: July 24, 1941 DOA: 01/10/2015  PCP: Lilyan Punt, MD  Admit date: 01/10/2015 Discharge date: 01/13/2015  Time spent: 40 minutes  Recommendations for Outpatient Follow-up:  1. Follow up with Dr Romeo Apple 2 weeks 2. Discharge to skilled nursing 3. Staples to be removed 01/25/15, weight bearing as tolerated, dressing change every other day  Discharge Diagnoses:  Principal Problem:   Closed right hip fracture Active Problems:   Iron deficiency anemia   Rheumatoid arthritis   Type 2 diabetes mellitus with HbA1C goal below 7.5   Alzheimer's disease   Hypothyroidism   CKD (chronic kidney disease) stage 3, GFR 30-59 ml/min   Intertrochanteric fracture of right femur   Fall   Discharge Condition: stable  Diet recommendation: regular soft  Filed Weights   01/10/15 1023 01/10/15 1710  Weight: 43.092 kg (95 lb) 40.824 kg (90 lb)    History of present illness:  Caitlin Riddle is a very pleasant 74 y.o. female with past medical history that includes advanced dementia is a resident at assisted [name], diabetes, chronic kidney disease presented to the emergency department with the chief complaint of recent fall and subsequent right hip pain. Initial evaluation revealed a displaced intertrochanteric fracture of the proximal right femur.  Information  obtained from the sister who was at the bedside as well as reports from the nursing facility. Patient was in the shower chair which gave way and she fell onto her right knee. Reportedly there was no loss of consciousness and she did not strike her head. Shortly thereafter she complained of pain in that right leg particular with repositioning and bearing weight. Prior to this she was in her usual state of health. There were no reports of any fever chills sick contacts or recent illness. No reports of nausea vomiting diarrhea constipation.  Workup in the emergency department  included a chest x-ray that was negative for any active disease, x-ray of that right knee without fracture but confirms osteoarthritis, x-ray of the right hip as noted above, complete blood count significant for WBCs of 10.7 hemoglobin 9.5, basic metabolic panel significant for creatinine of 1.64 and a serum glucose of 218. She was hemodynamically stable afebrile and not hypoxic. ProBNP is 106.  Hospital Course:  Hip fracture; as a result of a mechanical fall in the shower. Surgery 01/11/15. Good pain control. Will discharge to facility. Staple removal as above, weight bearing as tolerated and 28 days from surgery of Lovenox  Active Problems: Acute on chronic CKD (chronic kidney disease); stage II. Chart review indicates creatinine ranges 1.35-1.48. At discharge creatinine 1.34. She is incontinent but reportedly many episodes urine.    Iron deficiency anemia: s/p 2 units PRBC's 01/10/15. Per chart review and recent PCP note as well as anemia of chronic disease given her rheumatoid arthritis.anemia panel with RBC 3.6 saturation ratio 19 otherwise unremarkable. Chart review indicates a Hg baseline range 9.5-10.5. Hg 11.5 at discharge.    Rheumatoid arthritis: Stable at baseline.    Type 2 diabetes mellitus with HbA1C goal below 7.5: Hemoglobin A1c 6.3. She was recently taken off metformin due to #2.    Alzheimer's disease: Sr. states she's  at baseline. She is incontinent most of the time, is unable to make her wants and needs known, sometimes requires feeding but always requires encouragement to eat.   Hypothyroidism: 2 months ago TSH 2.35 down from 12.2 one year ago per chart review.   Procedures:  Repair hip fx  01/11/15  Consultations:  orthopedic  Discharge Exam: Filed Vitals:   01/13/15 1158  BP:   Pulse:   Temp:   Resp: 18    General: thin frail Cardiovascular: tachycardia but regular no MGR no LE edema Respiratory: normal effort BS clear MS: right hip with dressing dry  and intact  Discharge Instructions   Discharge Instructions    Diet - low sodium heart healthy    Complete by:  As directed      Discharge instructions    Complete by:  As directed   Staples removed 4/20 Dressing change every other day Weight bearing as tolerated     Increase activity slowly    Complete by:  As directed           Current Discharge Medication List    START taking these medications   Details  enoxaparin (LOVENOX) 30 MG/0.3ML injection Daily through 02/08/15 Qty: 0 Syringe    !! feeding supplement, RESOURCE BREEZE, (RESOURCE BREEZE) LIQD Take 1 Container by mouth 3 (three) times daily between meals. Refills: 0    HYDROcodone-acetaminophen (NORCO/VICODIN) 5-325 MG per tablet Take 1-2 tablets by mouth every 6 (six) hours as needed for moderate pain. Qty: 30 tablet, Refills: 0    metoprolol succinate (TOPROL-XL) 25 MG 24 hr tablet Take 0.5 tablets (12.5 mg total) by mouth daily.     !! - Potential duplicate medications found. Please discuss with provider.    CONTINUE these medications which have NOT CHANGED   Details  cadexomer iodine (IODOSORB) 0.9 % gel Apply 1 application topically every 3 (three) days.    cholecalciferol (VITAMIN D) 400 UNITS TABS tablet Take 800 Units by mouth daily.    levothyroxine (SYNTHROID, LEVOTHROID) 25 MCG tablet Take 1 tablet (25 mcg total) by mouth daily before breakfast. Qty: 90 tablet, Refills: 2    memantine (NAMENDA) 5 MG tablet Take 1 tablet (5 mg total) by mouth 2 (two) times daily. Qty: 60 tablet, Refills: 6    !! Nutritional Supplements (CARNATION INST BREAKFAST JUICE) LIQD Take 1 Bottle by mouth daily.    pravastatin (PRAVACHOL) 20 MG tablet TAKE 1 TABLET BY MOUTH ONCE DAILY. Qty: 30 tablet, Refills: 5    promethazine (PHENERGAN) 25 MG tablet TAKE 1 TABLET BY MOUTH EVERY 6 HOURS AS NEEDED FOR NAUSE OR VOMITING. Qty: 15 tablet, Refills: 2    Propylene Glycol (SYSTANE BALANCE OP) Apply 1 drop to eye 3 (three) times  daily.    SANTYL ointment APPLY TO AFFECTED AREA(S) ONCE DAILY AS NEEDED. Qty: 30 g, Refills: 6    traMADol (ULTRAM) 50 MG tablet TAKE (1) TABLET BY MOUTH BID PRN PAIN Qty: 60 tablet, Refills: 5    ZINC OXIDE, TOPICAL, 10 % CREA APPLY TO AFFECTED AREA AS NEEDED AFTER BOWEL MOVEMENT. Qty: 454 g, Refills: 2     !! - Potential duplicate medications found. Please discuss with provider.     No Known Allergies Follow-up Information    Follow up with Lilyan Punt, MD. Call on 01/19/2015.   Specialty:  Family Medicine   Why:  FOLLOW-UP APPT WITH Lilyan Punt ON THURSDAY APRIL 24,2353 @ 1:00   Contact information:   [address] [place] [address] [phone]        The results of significant diagnostics from this hospitalization (including imaging, microbiology, ancillary and laboratory) are listed below for reference.    Significant Diagnostic Studies: Dg Chest Port 1 View  01/10/2015   CLINICAL DATA:  Fall.  Hypertension.  EXAM: PORTABLE CHEST - 1 VIEW  COMPARISON:  None.  FINDINGS: The heart size and mediastinal contours are within normal limits. Both lungs are clear. No pneumothorax or pleural effusion is noted. The visualized skeletal structures are unremarkable.  IMPRESSION: No active disease.   Electronically Signed   By: Lupita Raider, M.D.   On: 01/10/2015 13:46   Dg Knee Complete 4 Views Right  01/10/2015   CLINICAL DATA:  Larey Seat this morning in shower when shower chair gave way, landed on RIGHT knee, RIGHT hip and knee pain  EXAM: RIGHT KNEE - COMPLETE 4+ VIEW  COMPARISON:  09/08/2013  FINDINGS: Marked osseous demineralization.  Diffuse joint space narrowing and spur formation.  Tiny chronic RIGHT knee joint effusion.  Patellar spur at quadriceps tendon insertion.  Deformities of the proximal RIGHT tibia and fibula from old healed fractures.  No acute fracture, dislocation, or bone destruction.  IMPRESSION: Osseous demineralization.  Old healed proximal RIGHT tibial  and fibular fractures.  Osteoarthritic changes RIGHT knee with small joint effusion.  No definite acute fracture or dislocation identified.   Electronically Signed   By: Ulyses Southward M.D.   On: 01/10/2015 12:38   Dg Hip Operative Unilat With Pelvis Right  01/11/2015   CLINICAL DATA:  Right hip internal fixation.  EXAM: OPERATIVE right HIP (WITH PELVIS IF PERFORMED)  VIEWS  TECHNIQUE: Fluoroscopic spot image(s) were submitted for interpretation post-operatively.  FLUOROSCOPY TIME:  Fluoroscopy Time:  1 min 20 seconds  Number of Acquired Images:  6  COMPARISON:  01/10/2015  FINDINGS: The patient status post open reduction internal fixation of right intertrochanteric hip fracture with good anatomic alignment.  IMPRESSION: Open reduction internal fixation of right intertrochanteric hip fracture with good anatomic alignment.   Electronically Signed   By: Maisie Fus  Register   On: 01/11/2015 11:48   Dg Hip Unilat With Pelvis 2-3 Views Right  01/10/2015   CLINICAL DATA:  Acute right hip pain after fall in shower this morning. Initial encounter.  EXAM: RIGHT HIP (WITH PELVIS) 2-3 VIEWS  COMPARISON:  None.  FINDINGS: Mildly displaced intertrochanteric fracture of the proximal right femur is noted. This appears to be closed and posttraumatic. 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  IMPRESSION: Mildly displaced intertrochanteric fracture of proximal right femur.   Electronically Signed   By: Lupita Raider, M.D.   On: 01/10/2015 12:35    Microbiology: Recent Results (from the past 240 hour(s))  Surgical pcr screen     Status: None   Collection Time: 01/10/15  5:30 PM  Result Value Ref Range Status   MRSA, PCR NEGATIVE NEGATIVE Final   Staphylococcus aureus NEGATIVE NEGATIVE Final    Comment:        The Xpert SA Assay (FDA approved for NASAL specimens in patients over 82 years of age), is one component of a comprehensive surveillance program.  Test performance has been validated by  [facility] for patients greater than or equal to 74 year old. It is not intended to diagnose infection nor to guide or monitor treatment.      Labs: Basic Metabolic Panel:  Recent Labs Lab 01/10/15 1252 01/11/15 0601 01/12/15 0605 01/13/15 0659  NA 142 142 139 140  K 4.5 4.3 4.4 4.4  CL 112 113* 112 113*  CO2 24 22 20 21   GLUCOSE 218* 209* 153* 125*  BUN 19 16 17 14   CREATININE 1.64* 1.30* 1.48* 1.34*  CALCIUM 8.9 9.0 8.6 8.8   Liver  Function Tests: No results for input(s): AST, ALT, ALKPHOS, BILITOT, PROT, ALBUMIN in the last 168 hours. No results for input(s): LIPASE, AMYLASE in the last 168 hours. No results for input(s): AMMONIA in the last 168 hours. CBC:  Recent Labs Lab 01/10/15 1252 01/11/15 0601 01/12/15 0605 01/13/15 0659  WBC 10.7* 11.1* 10.7* 11.9*  HGB 9.5* 12.9 12.2 11.5*  HCT 28.9* 37.5 37.1 34.0*  MCV 80.5 82.6 83.0 82.3  PLT 261 214 177 197   Cardiac Enzymes: No results for input(s): CKTOTAL, CKMB, CKMBINDEX, TROPONINI in the last 168 hours. BNP: BNP (last 3 results)  Recent Labs  01/10/15 1252  BNP 106.0*    ProBNP (last 3 results) No results for input(s): PROBNP in the last 8760 hours.  CBG:  Recent Labs Lab 01/12/15 1134 01/12/15 1635 01/12/15 2151 01/13/15 0751 01/13/15 1155  GLUCAP 150* 160* 157* 118* 286*       Signed:  BLACK,KAREN M  [facility] 01/13/2015, 1:21 PM

## 2015-01-16 ENCOUNTER — Encounter (HOSPITAL_COMMUNITY): Payer: Self-pay | Admitting: Orthopedic Surgery

## 2015-01-17 DIAGNOSIS — G309 Alzheimer's disease, unspecified: Secondary | ICD-10-CM | POA: Diagnosis not present

## 2015-01-17 DIAGNOSIS — D509 Iron deficiency anemia, unspecified: Secondary | ICD-10-CM | POA: Diagnosis not present

## 2015-01-17 DIAGNOSIS — E559 Vitamin D deficiency, unspecified: Secondary | ICD-10-CM | POA: Diagnosis not present

## 2015-01-17 DIAGNOSIS — N183 Chronic kidney disease, stage 3 (moderate): Secondary | ICD-10-CM | POA: Diagnosis not present

## 2015-01-17 DIAGNOSIS — E039 Hypothyroidism, unspecified: Secondary | ICD-10-CM | POA: Diagnosis not present

## 2015-01-19 ENCOUNTER — Ambulatory Visit: Payer: Medicare Other | Admitting: Family Medicine

## 2015-01-19 DIAGNOSIS — E559 Vitamin D deficiency, unspecified: Secondary | ICD-10-CM | POA: Diagnosis not present

## 2015-01-19 DIAGNOSIS — G309 Alzheimer's disease, unspecified: Secondary | ICD-10-CM | POA: Diagnosis not present

## 2015-01-25 ENCOUNTER — Ambulatory Visit: Payer: 59 | Admitting: Family Medicine

## 2015-01-31 DIAGNOSIS — D649 Anemia, unspecified: Secondary | ICD-10-CM | POA: Diagnosis not present

## 2015-01-31 DIAGNOSIS — G309 Alzheimer's disease, unspecified: Secondary | ICD-10-CM | POA: Diagnosis not present

## 2015-01-31 DIAGNOSIS — N183 Chronic kidney disease, stage 3 (moderate): Secondary | ICD-10-CM | POA: Diagnosis not present

## 2015-01-31 DIAGNOSIS — S72146A Nondisplaced intertrochanteric fracture of unspecified femur, initial encounter for closed fracture: Secondary | ICD-10-CM | POA: Diagnosis not present

## 2015-02-02 ENCOUNTER — Encounter: Payer: Self-pay | Admitting: Orthopedic Surgery

## 2015-02-02 ENCOUNTER — Ambulatory Visit (INDEPENDENT_AMBULATORY_CARE_PROVIDER_SITE_OTHER): Payer: Self-pay | Admitting: Orthopedic Surgery

## 2015-02-02 VITALS — BP 113/61 | Ht 64.0 in | Wt 90.0 lb

## 2015-02-02 DIAGNOSIS — S72141D Displaced intertrochanteric fracture of right femur, subsequent encounter for closed fracture with routine healing: Secondary | ICD-10-CM

## 2015-02-02 NOTE — Progress Notes (Signed)
Patient ID: Caitlin Riddle, female   DOB: June 07, 1941, 74 y.o.   MRN: [id number] Chief Complaint  Patient presents with  . Wound Check    hospital follow up, wound check, Right hip, OTIF 01/11/15   BP 113/61 mmHg  Ht 5\' 4"  (1.626 m)  Wt 90 lb (40.824 kg)  BMI 15.44 kg/m2  First postop visit with she had a gamma nail placed in the right hip she's at a nursing home. Her wounds look clean dry and intact her leg lengths look normal rotational alignment is normal  Continue weightbearing as tolerated follow-up in 5 weeks with an x-ray at 4 weeks from now prior to coming to the office  Encounter Diagnosis  Name Primary?  . Intertrochanteric fracture of right hip, closed, with routine healing, subsequent encounter Yes

## 2015-02-03 DIAGNOSIS — N183 Chronic kidney disease, stage 3 (moderate): Secondary | ICD-10-CM | POA: Diagnosis not present

## 2015-02-03 DIAGNOSIS — S72146A Nondisplaced intertrochanteric fracture of unspecified femur, initial encounter for closed fracture: Secondary | ICD-10-CM | POA: Diagnosis not present

## 2015-02-03 DIAGNOSIS — G309 Alzheimer's disease, unspecified: Secondary | ICD-10-CM | POA: Diagnosis not present

## 2015-02-03 DIAGNOSIS — D649 Anemia, unspecified: Secondary | ICD-10-CM | POA: Diagnosis not present

## 2015-02-07 DIAGNOSIS — S72141D Displaced intertrochanteric fracture of right femur, subsequent encounter for closed fracture with routine healing: Secondary | ICD-10-CM | POA: Diagnosis not present

## 2015-02-07 DIAGNOSIS — R41841 Cognitive communication deficit: Secondary | ICD-10-CM | POA: Diagnosis not present

## 2015-02-07 DIAGNOSIS — M6281 Muscle weakness (generalized): Secondary | ICD-10-CM | POA: Diagnosis not present

## 2015-02-07 DIAGNOSIS — R269 Unspecified abnormalities of gait and mobility: Secondary | ICD-10-CM | POA: Diagnosis not present

## 2015-02-08 DIAGNOSIS — R269 Unspecified abnormalities of gait and mobility: Secondary | ICD-10-CM | POA: Diagnosis not present

## 2015-02-08 DIAGNOSIS — M6281 Muscle weakness (generalized): Secondary | ICD-10-CM | POA: Diagnosis not present

## 2015-02-08 DIAGNOSIS — R41841 Cognitive communication deficit: Secondary | ICD-10-CM | POA: Diagnosis not present

## 2015-02-08 DIAGNOSIS — S72141D Displaced intertrochanteric fracture of right femur, subsequent encounter for closed fracture with routine healing: Secondary | ICD-10-CM | POA: Diagnosis not present

## 2015-02-09 DIAGNOSIS — M6281 Muscle weakness (generalized): Secondary | ICD-10-CM | POA: Diagnosis not present

## 2015-02-09 DIAGNOSIS — S72141D Displaced intertrochanteric fracture of right femur, subsequent encounter for closed fracture with routine healing: Secondary | ICD-10-CM | POA: Diagnosis not present

## 2015-02-09 DIAGNOSIS — R269 Unspecified abnormalities of gait and mobility: Secondary | ICD-10-CM | POA: Diagnosis not present

## 2015-02-09 DIAGNOSIS — R41841 Cognitive communication deficit: Secondary | ICD-10-CM | POA: Diagnosis not present

## 2015-02-10 DIAGNOSIS — R41841 Cognitive communication deficit: Secondary | ICD-10-CM | POA: Diagnosis not present

## 2015-02-10 DIAGNOSIS — S72141D Displaced intertrochanteric fracture of right femur, subsequent encounter for closed fracture with routine healing: Secondary | ICD-10-CM | POA: Diagnosis not present

## 2015-02-10 DIAGNOSIS — R269 Unspecified abnormalities of gait and mobility: Secondary | ICD-10-CM | POA: Diagnosis not present

## 2015-02-10 DIAGNOSIS — M6281 Muscle weakness (generalized): Secondary | ICD-10-CM | POA: Diagnosis not present

## 2015-02-11 DIAGNOSIS — R269 Unspecified abnormalities of gait and mobility: Secondary | ICD-10-CM | POA: Diagnosis not present

## 2015-02-11 DIAGNOSIS — M6281 Muscle weakness (generalized): Secondary | ICD-10-CM | POA: Diagnosis not present

## 2015-02-11 DIAGNOSIS — S72141D Displaced intertrochanteric fracture of right femur, subsequent encounter for closed fracture with routine healing: Secondary | ICD-10-CM | POA: Diagnosis not present

## 2015-02-11 DIAGNOSIS — R41841 Cognitive communication deficit: Secondary | ICD-10-CM | POA: Diagnosis not present

## 2015-02-12 DIAGNOSIS — M6281 Muscle weakness (generalized): Secondary | ICD-10-CM | POA: Diagnosis not present

## 2015-02-12 DIAGNOSIS — R269 Unspecified abnormalities of gait and mobility: Secondary | ICD-10-CM | POA: Diagnosis not present

## 2015-02-12 DIAGNOSIS — S72141D Displaced intertrochanteric fracture of right femur, subsequent encounter for closed fracture with routine healing: Secondary | ICD-10-CM | POA: Diagnosis not present

## 2015-02-12 DIAGNOSIS — R41841 Cognitive communication deficit: Secondary | ICD-10-CM | POA: Diagnosis not present

## 2015-02-13 DIAGNOSIS — S72141D Displaced intertrochanteric fracture of right femur, subsequent encounter for closed fracture with routine healing: Secondary | ICD-10-CM | POA: Diagnosis not present

## 2015-02-13 DIAGNOSIS — M6281 Muscle weakness (generalized): Secondary | ICD-10-CM | POA: Diagnosis not present

## 2015-02-13 DIAGNOSIS — R269 Unspecified abnormalities of gait and mobility: Secondary | ICD-10-CM | POA: Diagnosis not present

## 2015-02-13 DIAGNOSIS — R41841 Cognitive communication deficit: Secondary | ICD-10-CM | POA: Diagnosis not present

## 2015-02-14 DIAGNOSIS — R41841 Cognitive communication deficit: Secondary | ICD-10-CM | POA: Diagnosis not present

## 2015-02-14 DIAGNOSIS — S72141D Displaced intertrochanteric fracture of right femur, subsequent encounter for closed fracture with routine healing: Secondary | ICD-10-CM | POA: Diagnosis not present

## 2015-02-14 DIAGNOSIS — R269 Unspecified abnormalities of gait and mobility: Secondary | ICD-10-CM | POA: Diagnosis not present

## 2015-02-14 DIAGNOSIS — M6281 Muscle weakness (generalized): Secondary | ICD-10-CM | POA: Diagnosis not present

## 2015-02-15 DIAGNOSIS — M6281 Muscle weakness (generalized): Secondary | ICD-10-CM | POA: Diagnosis not present

## 2015-02-15 DIAGNOSIS — R41841 Cognitive communication deficit: Secondary | ICD-10-CM | POA: Diagnosis not present

## 2015-02-15 DIAGNOSIS — S72141D Displaced intertrochanteric fracture of right femur, subsequent encounter for closed fracture with routine healing: Secondary | ICD-10-CM | POA: Diagnosis not present

## 2015-02-15 DIAGNOSIS — R269 Unspecified abnormalities of gait and mobility: Secondary | ICD-10-CM | POA: Diagnosis not present

## 2015-02-16 DIAGNOSIS — S72141D Displaced intertrochanteric fracture of right femur, subsequent encounter for closed fracture with routine healing: Secondary | ICD-10-CM | POA: Diagnosis not present

## 2015-02-16 DIAGNOSIS — M6281 Muscle weakness (generalized): Secondary | ICD-10-CM | POA: Diagnosis not present

## 2015-02-16 DIAGNOSIS — R269 Unspecified abnormalities of gait and mobility: Secondary | ICD-10-CM | POA: Diagnosis not present

## 2015-02-16 DIAGNOSIS — R41841 Cognitive communication deficit: Secondary | ICD-10-CM | POA: Diagnosis not present

## 2015-02-17 DIAGNOSIS — R269 Unspecified abnormalities of gait and mobility: Secondary | ICD-10-CM | POA: Diagnosis not present

## 2015-02-17 DIAGNOSIS — M6281 Muscle weakness (generalized): Secondary | ICD-10-CM | POA: Diagnosis not present

## 2015-02-17 DIAGNOSIS — S72141D Displaced intertrochanteric fracture of right femur, subsequent encounter for closed fracture with routine healing: Secondary | ICD-10-CM | POA: Diagnosis not present

## 2015-02-17 DIAGNOSIS — R41841 Cognitive communication deficit: Secondary | ICD-10-CM | POA: Diagnosis not present

## 2015-02-18 DIAGNOSIS — S72141D Displaced intertrochanteric fracture of right femur, subsequent encounter for closed fracture with routine healing: Secondary | ICD-10-CM | POA: Diagnosis not present

## 2015-02-18 DIAGNOSIS — R269 Unspecified abnormalities of gait and mobility: Secondary | ICD-10-CM | POA: Diagnosis not present

## 2015-02-18 DIAGNOSIS — R41841 Cognitive communication deficit: Secondary | ICD-10-CM | POA: Diagnosis not present

## 2015-02-18 DIAGNOSIS — M6281 Muscle weakness (generalized): Secondary | ICD-10-CM | POA: Diagnosis not present

## 2015-02-19 DIAGNOSIS — R269 Unspecified abnormalities of gait and mobility: Secondary | ICD-10-CM | POA: Diagnosis not present

## 2015-02-19 DIAGNOSIS — R41841 Cognitive communication deficit: Secondary | ICD-10-CM | POA: Diagnosis not present

## 2015-02-19 DIAGNOSIS — M6281 Muscle weakness (generalized): Secondary | ICD-10-CM | POA: Diagnosis not present

## 2015-02-19 DIAGNOSIS — S72141D Displaced intertrochanteric fracture of right femur, subsequent encounter for closed fracture with routine healing: Secondary | ICD-10-CM | POA: Diagnosis not present

## 2015-02-20 DIAGNOSIS — R269 Unspecified abnormalities of gait and mobility: Secondary | ICD-10-CM | POA: Diagnosis not present

## 2015-02-20 DIAGNOSIS — M6281 Muscle weakness (generalized): Secondary | ICD-10-CM | POA: Diagnosis not present

## 2015-02-20 DIAGNOSIS — S72141D Displaced intertrochanteric fracture of right femur, subsequent encounter for closed fracture with routine healing: Secondary | ICD-10-CM | POA: Diagnosis not present

## 2015-02-20 DIAGNOSIS — R41841 Cognitive communication deficit: Secondary | ICD-10-CM | POA: Diagnosis not present

## 2015-02-21 DIAGNOSIS — S72141D Displaced intertrochanteric fracture of right femur, subsequent encounter for closed fracture with routine healing: Secondary | ICD-10-CM | POA: Diagnosis not present

## 2015-02-21 DIAGNOSIS — R269 Unspecified abnormalities of gait and mobility: Secondary | ICD-10-CM | POA: Diagnosis not present

## 2015-02-21 DIAGNOSIS — M6281 Muscle weakness (generalized): Secondary | ICD-10-CM | POA: Diagnosis not present

## 2015-02-21 DIAGNOSIS — R41841 Cognitive communication deficit: Secondary | ICD-10-CM | POA: Diagnosis not present

## 2015-02-22 DIAGNOSIS — R41841 Cognitive communication deficit: Secondary | ICD-10-CM | POA: Diagnosis not present

## 2015-02-22 DIAGNOSIS — M6281 Muscle weakness (generalized): Secondary | ICD-10-CM | POA: Diagnosis not present

## 2015-02-22 DIAGNOSIS — R269 Unspecified abnormalities of gait and mobility: Secondary | ICD-10-CM | POA: Diagnosis not present

## 2015-02-22 DIAGNOSIS — S72141D Displaced intertrochanteric fracture of right femur, subsequent encounter for closed fracture with routine healing: Secondary | ICD-10-CM | POA: Diagnosis not present

## 2015-02-23 DIAGNOSIS — M6281 Muscle weakness (generalized): Secondary | ICD-10-CM | POA: Diagnosis not present

## 2015-02-23 DIAGNOSIS — R269 Unspecified abnormalities of gait and mobility: Secondary | ICD-10-CM | POA: Diagnosis not present

## 2015-02-23 DIAGNOSIS — R41841 Cognitive communication deficit: Secondary | ICD-10-CM | POA: Diagnosis not present

## 2015-02-23 DIAGNOSIS — S72141D Displaced intertrochanteric fracture of right femur, subsequent encounter for closed fracture with routine healing: Secondary | ICD-10-CM | POA: Diagnosis not present

## 2015-02-24 ENCOUNTER — Encounter (HOSPITAL_COMMUNITY): Payer: Self-pay | Admitting: *Deleted

## 2015-02-24 DIAGNOSIS — R41841 Cognitive communication deficit: Secondary | ICD-10-CM | POA: Diagnosis not present

## 2015-02-24 DIAGNOSIS — R269 Unspecified abnormalities of gait and mobility: Secondary | ICD-10-CM | POA: Diagnosis not present

## 2015-02-24 DIAGNOSIS — S72141D Displaced intertrochanteric fracture of right femur, subsequent encounter for closed fracture with routine healing: Secondary | ICD-10-CM | POA: Diagnosis not present

## 2015-02-24 DIAGNOSIS — M6281 Muscle weakness (generalized): Secondary | ICD-10-CM | POA: Diagnosis not present

## 2015-02-27 DIAGNOSIS — R269 Unspecified abnormalities of gait and mobility: Secondary | ICD-10-CM | POA: Diagnosis not present

## 2015-02-27 DIAGNOSIS — S72141D Displaced intertrochanteric fracture of right femur, subsequent encounter for closed fracture with routine healing: Secondary | ICD-10-CM | POA: Diagnosis not present

## 2015-02-27 DIAGNOSIS — R41841 Cognitive communication deficit: Secondary | ICD-10-CM | POA: Diagnosis not present

## 2015-02-27 DIAGNOSIS — M6281 Muscle weakness (generalized): Secondary | ICD-10-CM | POA: Diagnosis not present

## 2015-02-28 DIAGNOSIS — R269 Unspecified abnormalities of gait and mobility: Secondary | ICD-10-CM | POA: Diagnosis not present

## 2015-02-28 DIAGNOSIS — S72141D Displaced intertrochanteric fracture of right femur, subsequent encounter for closed fracture with routine healing: Secondary | ICD-10-CM | POA: Diagnosis not present

## 2015-02-28 DIAGNOSIS — R41841 Cognitive communication deficit: Secondary | ICD-10-CM | POA: Diagnosis not present

## 2015-02-28 DIAGNOSIS — M6281 Muscle weakness (generalized): Secondary | ICD-10-CM | POA: Diagnosis not present

## 2015-03-01 DIAGNOSIS — S72141D Displaced intertrochanteric fracture of right femur, subsequent encounter for closed fracture with routine healing: Secondary | ICD-10-CM | POA: Diagnosis not present

## 2015-03-01 DIAGNOSIS — R41841 Cognitive communication deficit: Secondary | ICD-10-CM | POA: Diagnosis not present

## 2015-03-01 DIAGNOSIS — M6281 Muscle weakness (generalized): Secondary | ICD-10-CM | POA: Diagnosis not present

## 2015-03-01 DIAGNOSIS — R269 Unspecified abnormalities of gait and mobility: Secondary | ICD-10-CM | POA: Diagnosis not present

## 2015-03-02 ENCOUNTER — Other Ambulatory Visit: Payer: Self-pay | Admitting: Orthopedic Surgery

## 2015-03-02 ENCOUNTER — Ambulatory Visit (HOSPITAL_COMMUNITY)
Admission: RE | Admit: 2015-03-02 | Discharge: 2015-03-02 | Disposition: A | Discharge status: Home / Self Care | Payer: Medicare Other | Source: Ambulatory Visit | Attending: Orthopedic Surgery | Admitting: Orthopedic Surgery

## 2015-03-02 DIAGNOSIS — S72001A Fracture of unspecified part of neck of right femur, initial encounter for closed fracture: Secondary | ICD-10-CM

## 2015-03-02 DIAGNOSIS — Z9889 Other specified postprocedural states: Secondary | ICD-10-CM | POA: Diagnosis not present

## 2015-03-02 DIAGNOSIS — M6281 Muscle weakness (generalized): Secondary | ICD-10-CM | POA: Diagnosis not present

## 2015-03-02 DIAGNOSIS — S72001D Fracture of unspecified part of neck of right femur, subsequent encounter for closed fracture with routine healing: Secondary | ICD-10-CM | POA: Insufficient documentation

## 2015-03-02 DIAGNOSIS — R269 Unspecified abnormalities of gait and mobility: Secondary | ICD-10-CM | POA: Diagnosis not present

## 2015-03-02 DIAGNOSIS — S72141D Displaced intertrochanteric fracture of right femur, subsequent encounter for closed fracture with routine healing: Secondary | ICD-10-CM | POA: Diagnosis not present

## 2015-03-02 DIAGNOSIS — R41841 Cognitive communication deficit: Secondary | ICD-10-CM | POA: Diagnosis not present

## 2015-03-03 DIAGNOSIS — S72141D Displaced intertrochanteric fracture of right femur, subsequent encounter for closed fracture with routine healing: Secondary | ICD-10-CM | POA: Diagnosis not present

## 2015-03-03 DIAGNOSIS — R41841 Cognitive communication deficit: Secondary | ICD-10-CM | POA: Diagnosis not present

## 2015-03-03 DIAGNOSIS — R269 Unspecified abnormalities of gait and mobility: Secondary | ICD-10-CM | POA: Diagnosis not present

## 2015-03-03 DIAGNOSIS — M6281 Muscle weakness (generalized): Secondary | ICD-10-CM | POA: Diagnosis not present

## 2015-03-06 DIAGNOSIS — M6281 Muscle weakness (generalized): Secondary | ICD-10-CM | POA: Diagnosis not present

## 2015-03-06 DIAGNOSIS — R41841 Cognitive communication deficit: Secondary | ICD-10-CM | POA: Diagnosis not present

## 2015-03-06 DIAGNOSIS — S72141D Displaced intertrochanteric fracture of right femur, subsequent encounter for closed fracture with routine healing: Secondary | ICD-10-CM | POA: Diagnosis not present

## 2015-03-06 DIAGNOSIS — R269 Unspecified abnormalities of gait and mobility: Secondary | ICD-10-CM | POA: Diagnosis not present

## 2015-03-07 DIAGNOSIS — R269 Unspecified abnormalities of gait and mobility: Secondary | ICD-10-CM | POA: Diagnosis not present

## 2015-03-07 DIAGNOSIS — R41841 Cognitive communication deficit: Secondary | ICD-10-CM | POA: Diagnosis not present

## 2015-03-07 DIAGNOSIS — S72141D Displaced intertrochanteric fracture of right femur, subsequent encounter for closed fracture with routine healing: Secondary | ICD-10-CM | POA: Diagnosis not present

## 2015-03-07 DIAGNOSIS — M6281 Muscle weakness (generalized): Secondary | ICD-10-CM | POA: Diagnosis not present

## 2015-03-08 DIAGNOSIS — R41841 Cognitive communication deficit: Secondary | ICD-10-CM | POA: Diagnosis not present

## 2015-03-08 DIAGNOSIS — R269 Unspecified abnormalities of gait and mobility: Secondary | ICD-10-CM | POA: Diagnosis not present

## 2015-03-08 DIAGNOSIS — M6281 Muscle weakness (generalized): Secondary | ICD-10-CM | POA: Diagnosis not present

## 2015-03-08 DIAGNOSIS — S72141D Displaced intertrochanteric fracture of right femur, subsequent encounter for closed fracture with routine healing: Secondary | ICD-10-CM | POA: Diagnosis not present

## 2015-03-09 ENCOUNTER — Encounter: Payer: Self-pay | Admitting: Orthopedic Surgery

## 2015-03-09 ENCOUNTER — Ambulatory Visit (INDEPENDENT_AMBULATORY_CARE_PROVIDER_SITE_OTHER): Payer: Self-pay | Admitting: Orthopedic Surgery

## 2015-03-09 VITALS — BP 130/61 | Ht 64.0 in | Wt 90.0 lb

## 2015-03-09 DIAGNOSIS — S72141D Displaced intertrochanteric fracture of right femur, subsequent encounter for closed fracture with routine healing: Secondary | ICD-10-CM | POA: Diagnosis not present

## 2015-03-09 DIAGNOSIS — M6281 Muscle weakness (generalized): Secondary | ICD-10-CM | POA: Diagnosis not present

## 2015-03-09 DIAGNOSIS — R41841 Cognitive communication deficit: Secondary | ICD-10-CM | POA: Diagnosis not present

## 2015-03-09 DIAGNOSIS — R269 Unspecified abnormalities of gait and mobility: Secondary | ICD-10-CM | POA: Diagnosis not present

## 2015-03-09 NOTE — Progress Notes (Signed)
Postop postop visit Chief Complaint  Patient presents with  . Follow-up    5 week follow up right hip fracture, DOS 01/11/15    BP 130/61 mmHg  Ht 5\' 4"  (1.626 m)  Wt 90 lb (40.824 kg)  BMI 15.44 kg/m2  Encounter Diagnosis  Name Primary?  . Intertrochanteric fracture of right hip, closed, with routine healing, subsequent encounter Yes    X-rays done at the hospital. I saw him. Fractures stable. Screw is somewhat posterior but tip apex distance is good and the screw is not anterior or superior  Hip flexion is normal for the patient  I have no idea if she is walking no reports were sent with her  Recommend continued physical therapy weightbearing as tolerated follow-up 6 weeks x-ray here [facility] and lateral for Gamma nail right hip

## 2015-03-10 DIAGNOSIS — R269 Unspecified abnormalities of gait and mobility: Secondary | ICD-10-CM | POA: Diagnosis not present

## 2015-03-10 DIAGNOSIS — R41841 Cognitive communication deficit: Secondary | ICD-10-CM | POA: Diagnosis not present

## 2015-03-10 DIAGNOSIS — S72141D Displaced intertrochanteric fracture of right femur, subsequent encounter for closed fracture with routine healing: Secondary | ICD-10-CM | POA: Diagnosis not present

## 2015-03-10 DIAGNOSIS — M6281 Muscle weakness (generalized): Secondary | ICD-10-CM | POA: Diagnosis not present

## 2015-03-11 DIAGNOSIS — R269 Unspecified abnormalities of gait and mobility: Secondary | ICD-10-CM | POA: Diagnosis not present

## 2015-03-11 DIAGNOSIS — R41841 Cognitive communication deficit: Secondary | ICD-10-CM | POA: Diagnosis not present

## 2015-03-11 DIAGNOSIS — S72141D Displaced intertrochanteric fracture of right femur, subsequent encounter for closed fracture with routine healing: Secondary | ICD-10-CM | POA: Diagnosis not present

## 2015-03-11 DIAGNOSIS — M6281 Muscle weakness (generalized): Secondary | ICD-10-CM | POA: Diagnosis not present

## 2015-03-12 DIAGNOSIS — R41841 Cognitive communication deficit: Secondary | ICD-10-CM | POA: Diagnosis not present

## 2015-03-12 DIAGNOSIS — M6281 Muscle weakness (generalized): Secondary | ICD-10-CM | POA: Diagnosis not present

## 2015-03-12 DIAGNOSIS — R269 Unspecified abnormalities of gait and mobility: Secondary | ICD-10-CM | POA: Diagnosis not present

## 2015-03-12 DIAGNOSIS — S72141D Displaced intertrochanteric fracture of right femur, subsequent encounter for closed fracture with routine healing: Secondary | ICD-10-CM | POA: Diagnosis not present

## 2015-03-13 DIAGNOSIS — S72141D Displaced intertrochanteric fracture of right femur, subsequent encounter for closed fracture with routine healing: Secondary | ICD-10-CM | POA: Diagnosis not present

## 2015-03-13 DIAGNOSIS — M6281 Muscle weakness (generalized): Secondary | ICD-10-CM | POA: Diagnosis not present

## 2015-03-13 DIAGNOSIS — R41841 Cognitive communication deficit: Secondary | ICD-10-CM | POA: Diagnosis not present

## 2015-03-13 DIAGNOSIS — R269 Unspecified abnormalities of gait and mobility: Secondary | ICD-10-CM | POA: Diagnosis not present

## 2015-03-14 DIAGNOSIS — R269 Unspecified abnormalities of gait and mobility: Secondary | ICD-10-CM | POA: Diagnosis not present

## 2015-03-14 DIAGNOSIS — R41841 Cognitive communication deficit: Secondary | ICD-10-CM | POA: Diagnosis not present

## 2015-03-14 DIAGNOSIS — M6281 Muscle weakness (generalized): Secondary | ICD-10-CM | POA: Diagnosis not present

## 2015-03-14 DIAGNOSIS — S72141D Displaced intertrochanteric fracture of right femur, subsequent encounter for closed fracture with routine healing: Secondary | ICD-10-CM | POA: Diagnosis not present

## 2015-03-15 DIAGNOSIS — M6281 Muscle weakness (generalized): Secondary | ICD-10-CM | POA: Diagnosis not present

## 2015-03-15 DIAGNOSIS — R41841 Cognitive communication deficit: Secondary | ICD-10-CM | POA: Diagnosis not present

## 2015-03-15 DIAGNOSIS — R269 Unspecified abnormalities of gait and mobility: Secondary | ICD-10-CM | POA: Diagnosis not present

## 2015-03-15 DIAGNOSIS — S72141D Displaced intertrochanteric fracture of right femur, subsequent encounter for closed fracture with routine healing: Secondary | ICD-10-CM | POA: Diagnosis not present

## 2015-03-16 DIAGNOSIS — D649 Anemia, unspecified: Secondary | ICD-10-CM | POA: Diagnosis not present

## 2015-03-16 DIAGNOSIS — S72141D Displaced intertrochanteric fracture of right femur, subsequent encounter for closed fracture with routine healing: Secondary | ICD-10-CM | POA: Diagnosis not present

## 2015-03-16 DIAGNOSIS — E46 Unspecified protein-calorie malnutrition: Secondary | ICD-10-CM | POA: Diagnosis not present

## 2015-03-16 DIAGNOSIS — R41841 Cognitive communication deficit: Secondary | ICD-10-CM | POA: Diagnosis not present

## 2015-03-16 DIAGNOSIS — E559 Vitamin D deficiency, unspecified: Secondary | ICD-10-CM | POA: Diagnosis not present

## 2015-03-16 DIAGNOSIS — M6281 Muscle weakness (generalized): Secondary | ICD-10-CM | POA: Diagnosis not present

## 2015-03-16 DIAGNOSIS — W07XXXA Fall from chair, initial encounter: Secondary | ICD-10-CM | POA: Diagnosis not present

## 2015-03-16 DIAGNOSIS — R269 Unspecified abnormalities of gait and mobility: Secondary | ICD-10-CM | POA: Diagnosis not present

## 2015-03-16 DIAGNOSIS — N183 Chronic kidney disease, stage 3 (moderate): Secondary | ICD-10-CM | POA: Diagnosis not present

## 2015-03-17 DIAGNOSIS — E46 Unspecified protein-calorie malnutrition: Secondary | ICD-10-CM | POA: Diagnosis not present

## 2015-03-17 DIAGNOSIS — W07XXXA Fall from chair, initial encounter: Secondary | ICD-10-CM | POA: Diagnosis not present

## 2015-03-17 DIAGNOSIS — D649 Anemia, unspecified: Secondary | ICD-10-CM | POA: Diagnosis not present

## 2015-03-17 DIAGNOSIS — M6281 Muscle weakness (generalized): Secondary | ICD-10-CM | POA: Diagnosis not present

## 2015-03-17 DIAGNOSIS — R269 Unspecified abnormalities of gait and mobility: Secondary | ICD-10-CM | POA: Diagnosis not present

## 2015-03-17 DIAGNOSIS — N179 Acute kidney failure, unspecified: Secondary | ICD-10-CM | POA: Diagnosis not present

## 2015-03-17 DIAGNOSIS — S72141D Displaced intertrochanteric fracture of right femur, subsequent encounter for closed fracture with routine healing: Secondary | ICD-10-CM | POA: Diagnosis not present

## 2015-03-17 DIAGNOSIS — Z79899 Other long term (current) drug therapy: Secondary | ICD-10-CM | POA: Diagnosis not present

## 2015-03-17 DIAGNOSIS — N183 Chronic kidney disease, stage 3 (moderate): Secondary | ICD-10-CM | POA: Diagnosis not present

## 2015-03-17 DIAGNOSIS — R41841 Cognitive communication deficit: Secondary | ICD-10-CM | POA: Diagnosis not present

## 2015-03-17 DIAGNOSIS — E559 Vitamin D deficiency, unspecified: Secondary | ICD-10-CM | POA: Diagnosis not present

## 2015-03-20 DIAGNOSIS — M6281 Muscle weakness (generalized): Secondary | ICD-10-CM | POA: Diagnosis not present

## 2015-03-20 DIAGNOSIS — S72141D Displaced intertrochanteric fracture of right femur, subsequent encounter for closed fracture with routine healing: Secondary | ICD-10-CM | POA: Diagnosis not present

## 2015-03-20 DIAGNOSIS — R269 Unspecified abnormalities of gait and mobility: Secondary | ICD-10-CM | POA: Diagnosis not present

## 2015-03-20 DIAGNOSIS — R41841 Cognitive communication deficit: Secondary | ICD-10-CM | POA: Diagnosis not present

## 2015-03-21 DIAGNOSIS — R41841 Cognitive communication deficit: Secondary | ICD-10-CM | POA: Diagnosis not present

## 2015-03-21 DIAGNOSIS — M6281 Muscle weakness (generalized): Secondary | ICD-10-CM | POA: Diagnosis not present

## 2015-03-21 DIAGNOSIS — R269 Unspecified abnormalities of gait and mobility: Secondary | ICD-10-CM | POA: Diagnosis not present

## 2015-03-21 DIAGNOSIS — S72141D Displaced intertrochanteric fracture of right femur, subsequent encounter for closed fracture with routine healing: Secondary | ICD-10-CM | POA: Diagnosis not present

## 2015-03-22 DIAGNOSIS — R269 Unspecified abnormalities of gait and mobility: Secondary | ICD-10-CM | POA: Diagnosis not present

## 2015-03-22 DIAGNOSIS — R41841 Cognitive communication deficit: Secondary | ICD-10-CM | POA: Diagnosis not present

## 2015-03-22 DIAGNOSIS — M6281 Muscle weakness (generalized): Secondary | ICD-10-CM | POA: Diagnosis not present

## 2015-03-22 DIAGNOSIS — S72141D Displaced intertrochanteric fracture of right femur, subsequent encounter for closed fracture with routine healing: Secondary | ICD-10-CM | POA: Diagnosis not present

## 2015-03-23 DIAGNOSIS — R41841 Cognitive communication deficit: Secondary | ICD-10-CM | POA: Diagnosis not present

## 2015-03-23 DIAGNOSIS — R269 Unspecified abnormalities of gait and mobility: Secondary | ICD-10-CM | POA: Diagnosis not present

## 2015-03-23 DIAGNOSIS — M6281 Muscle weakness (generalized): Secondary | ICD-10-CM | POA: Diagnosis not present

## 2015-03-23 DIAGNOSIS — S72141D Displaced intertrochanteric fracture of right femur, subsequent encounter for closed fracture with routine healing: Secondary | ICD-10-CM | POA: Diagnosis not present

## 2015-03-24 DIAGNOSIS — E039 Hypothyroidism, unspecified: Secondary | ICD-10-CM | POA: Diagnosis not present

## 2015-03-24 DIAGNOSIS — R41841 Cognitive communication deficit: Secondary | ICD-10-CM | POA: Diagnosis not present

## 2015-03-24 DIAGNOSIS — E785 Hyperlipidemia, unspecified: Secondary | ICD-10-CM | POA: Diagnosis not present

## 2015-03-24 DIAGNOSIS — D649 Anemia, unspecified: Secondary | ICD-10-CM | POA: Diagnosis not present

## 2015-03-24 DIAGNOSIS — S72141D Displaced intertrochanteric fracture of right femur, subsequent encounter for closed fracture with routine healing: Secondary | ICD-10-CM | POA: Diagnosis not present

## 2015-03-24 DIAGNOSIS — R269 Unspecified abnormalities of gait and mobility: Secondary | ICD-10-CM | POA: Diagnosis not present

## 2015-03-24 DIAGNOSIS — M6281 Muscle weakness (generalized): Secondary | ICD-10-CM | POA: Diagnosis not present

## 2015-03-24 DIAGNOSIS — N179 Acute kidney failure, unspecified: Secondary | ICD-10-CM | POA: Diagnosis not present

## 2015-03-24 DIAGNOSIS — Z79899 Other long term (current) drug therapy: Secondary | ICD-10-CM | POA: Diagnosis not present

## 2015-03-24 DIAGNOSIS — R197 Diarrhea, unspecified: Secondary | ICD-10-CM | POA: Diagnosis not present

## 2015-03-25 DIAGNOSIS — S72141D Displaced intertrochanteric fracture of right femur, subsequent encounter for closed fracture with routine healing: Secondary | ICD-10-CM | POA: Diagnosis not present

## 2015-03-25 DIAGNOSIS — R41841 Cognitive communication deficit: Secondary | ICD-10-CM | POA: Diagnosis not present

## 2015-03-25 DIAGNOSIS — M6281 Muscle weakness (generalized): Secondary | ICD-10-CM | POA: Diagnosis not present

## 2015-03-25 DIAGNOSIS — R269 Unspecified abnormalities of gait and mobility: Secondary | ICD-10-CM | POA: Diagnosis not present

## 2015-03-26 DIAGNOSIS — R269 Unspecified abnormalities of gait and mobility: Secondary | ICD-10-CM | POA: Diagnosis not present

## 2015-03-26 DIAGNOSIS — S72141D Displaced intertrochanteric fracture of right femur, subsequent encounter for closed fracture with routine healing: Secondary | ICD-10-CM | POA: Diagnosis not present

## 2015-03-26 DIAGNOSIS — M6281 Muscle weakness (generalized): Secondary | ICD-10-CM | POA: Diagnosis not present

## 2015-03-26 DIAGNOSIS — R41841 Cognitive communication deficit: Secondary | ICD-10-CM | POA: Diagnosis not present

## 2015-03-27 DIAGNOSIS — M6281 Muscle weakness (generalized): Secondary | ICD-10-CM | POA: Diagnosis not present

## 2015-03-27 DIAGNOSIS — R269 Unspecified abnormalities of gait and mobility: Secondary | ICD-10-CM | POA: Diagnosis not present

## 2015-03-27 DIAGNOSIS — R41841 Cognitive communication deficit: Secondary | ICD-10-CM | POA: Diagnosis not present

## 2015-03-27 DIAGNOSIS — S72141D Displaced intertrochanteric fracture of right femur, subsequent encounter for closed fracture with routine healing: Secondary | ICD-10-CM | POA: Diagnosis not present

## 2015-03-28 DIAGNOSIS — S72141D Displaced intertrochanteric fracture of right femur, subsequent encounter for closed fracture with routine healing: Secondary | ICD-10-CM | POA: Diagnosis not present

## 2015-03-28 DIAGNOSIS — R41841 Cognitive communication deficit: Secondary | ICD-10-CM | POA: Diagnosis not present

## 2015-03-28 DIAGNOSIS — M6281 Muscle weakness (generalized): Secondary | ICD-10-CM | POA: Diagnosis not present

## 2015-03-28 DIAGNOSIS — R269 Unspecified abnormalities of gait and mobility: Secondary | ICD-10-CM | POA: Diagnosis not present

## 2015-03-29 DIAGNOSIS — M6281 Muscle weakness (generalized): Secondary | ICD-10-CM | POA: Diagnosis not present

## 2015-03-29 DIAGNOSIS — R269 Unspecified abnormalities of gait and mobility: Secondary | ICD-10-CM | POA: Diagnosis not present

## 2015-03-29 DIAGNOSIS — S72141D Displaced intertrochanteric fracture of right femur, subsequent encounter for closed fracture with routine healing: Secondary | ICD-10-CM | POA: Diagnosis not present

## 2015-03-29 DIAGNOSIS — R41841 Cognitive communication deficit: Secondary | ICD-10-CM | POA: Diagnosis not present

## 2015-03-31 DIAGNOSIS — M6281 Muscle weakness (generalized): Secondary | ICD-10-CM | POA: Diagnosis not present

## 2015-03-31 DIAGNOSIS — R41841 Cognitive communication deficit: Secondary | ICD-10-CM | POA: Diagnosis not present

## 2015-03-31 DIAGNOSIS — S72141D Displaced intertrochanteric fracture of right femur, subsequent encounter for closed fracture with routine healing: Secondary | ICD-10-CM | POA: Diagnosis not present

## 2015-03-31 DIAGNOSIS — R269 Unspecified abnormalities of gait and mobility: Secondary | ICD-10-CM | POA: Diagnosis not present

## 2015-04-02 DIAGNOSIS — R41841 Cognitive communication deficit: Secondary | ICD-10-CM | POA: Diagnosis not present

## 2015-04-02 DIAGNOSIS — S72141D Displaced intertrochanteric fracture of right femur, subsequent encounter for closed fracture with routine healing: Secondary | ICD-10-CM | POA: Diagnosis not present

## 2015-04-02 DIAGNOSIS — R269 Unspecified abnormalities of gait and mobility: Secondary | ICD-10-CM | POA: Diagnosis not present

## 2015-04-02 DIAGNOSIS — M6281 Muscle weakness (generalized): Secondary | ICD-10-CM | POA: Diagnosis not present

## 2015-04-03 DIAGNOSIS — R41841 Cognitive communication deficit: Secondary | ICD-10-CM | POA: Diagnosis not present

## 2015-04-03 DIAGNOSIS — M6281 Muscle weakness (generalized): Secondary | ICD-10-CM | POA: Diagnosis not present

## 2015-04-03 DIAGNOSIS — R269 Unspecified abnormalities of gait and mobility: Secondary | ICD-10-CM | POA: Diagnosis not present

## 2015-04-03 DIAGNOSIS — S72141D Displaced intertrochanteric fracture of right femur, subsequent encounter for closed fracture with routine healing: Secondary | ICD-10-CM | POA: Diagnosis not present

## 2015-04-05 DIAGNOSIS — R41841 Cognitive communication deficit: Secondary | ICD-10-CM | POA: Diagnosis not present

## 2015-04-05 DIAGNOSIS — S72141D Displaced intertrochanteric fracture of right femur, subsequent encounter for closed fracture with routine healing: Secondary | ICD-10-CM | POA: Diagnosis not present

## 2015-04-05 DIAGNOSIS — M6281 Muscle weakness (generalized): Secondary | ICD-10-CM | POA: Diagnosis not present

## 2015-04-05 DIAGNOSIS — R269 Unspecified abnormalities of gait and mobility: Secondary | ICD-10-CM | POA: Diagnosis not present

## 2015-04-07 DIAGNOSIS — S72141D Displaced intertrochanteric fracture of right femur, subsequent encounter for closed fracture with routine healing: Secondary | ICD-10-CM | POA: Diagnosis not present

## 2015-04-07 DIAGNOSIS — M6281 Muscle weakness (generalized): Secondary | ICD-10-CM | POA: Diagnosis not present

## 2015-04-07 DIAGNOSIS — R269 Unspecified abnormalities of gait and mobility: Secondary | ICD-10-CM | POA: Diagnosis not present

## 2015-04-07 DIAGNOSIS — R41841 Cognitive communication deficit: Secondary | ICD-10-CM | POA: Diagnosis not present

## 2015-04-08 DIAGNOSIS — R269 Unspecified abnormalities of gait and mobility: Secondary | ICD-10-CM | POA: Diagnosis not present

## 2015-04-08 DIAGNOSIS — M6281 Muscle weakness (generalized): Secondary | ICD-10-CM | POA: Diagnosis not present

## 2015-04-08 DIAGNOSIS — R41841 Cognitive communication deficit: Secondary | ICD-10-CM | POA: Diagnosis not present

## 2015-04-08 DIAGNOSIS — S72141D Displaced intertrochanteric fracture of right femur, subsequent encounter for closed fracture with routine healing: Secondary | ICD-10-CM | POA: Diagnosis not present

## 2015-04-09 DIAGNOSIS — S72141D Displaced intertrochanteric fracture of right femur, subsequent encounter for closed fracture with routine healing: Secondary | ICD-10-CM | POA: Diagnosis not present

## 2015-04-09 DIAGNOSIS — R41841 Cognitive communication deficit: Secondary | ICD-10-CM | POA: Diagnosis not present

## 2015-04-09 DIAGNOSIS — R269 Unspecified abnormalities of gait and mobility: Secondary | ICD-10-CM | POA: Diagnosis not present

## 2015-04-09 DIAGNOSIS — M6281 Muscle weakness (generalized): Secondary | ICD-10-CM | POA: Diagnosis not present

## 2015-04-11 DIAGNOSIS — R41841 Cognitive communication deficit: Secondary | ICD-10-CM | POA: Diagnosis not present

## 2015-04-11 DIAGNOSIS — M6281 Muscle weakness (generalized): Secondary | ICD-10-CM | POA: Diagnosis not present

## 2015-04-11 DIAGNOSIS — R269 Unspecified abnormalities of gait and mobility: Secondary | ICD-10-CM | POA: Diagnosis not present

## 2015-04-11 DIAGNOSIS — S72141D Displaced intertrochanteric fracture of right femur, subsequent encounter for closed fracture with routine healing: Secondary | ICD-10-CM | POA: Diagnosis not present

## 2015-04-12 DIAGNOSIS — R41841 Cognitive communication deficit: Secondary | ICD-10-CM | POA: Diagnosis not present

## 2015-04-12 DIAGNOSIS — S72141D Displaced intertrochanteric fracture of right femur, subsequent encounter for closed fracture with routine healing: Secondary | ICD-10-CM | POA: Diagnosis not present

## 2015-04-12 DIAGNOSIS — R269 Unspecified abnormalities of gait and mobility: Secondary | ICD-10-CM | POA: Diagnosis not present

## 2015-04-12 DIAGNOSIS — M6281 Muscle weakness (generalized): Secondary | ICD-10-CM | POA: Diagnosis not present

## 2015-04-13 DIAGNOSIS — R269 Unspecified abnormalities of gait and mobility: Secondary | ICD-10-CM | POA: Diagnosis not present

## 2015-04-13 DIAGNOSIS — S72141D Displaced intertrochanteric fracture of right femur, subsequent encounter for closed fracture with routine healing: Secondary | ICD-10-CM | POA: Diagnosis not present

## 2015-04-13 DIAGNOSIS — M6281 Muscle weakness (generalized): Secondary | ICD-10-CM | POA: Diagnosis not present

## 2015-04-13 DIAGNOSIS — R41841 Cognitive communication deficit: Secondary | ICD-10-CM | POA: Diagnosis not present

## 2015-04-14 DIAGNOSIS — R269 Unspecified abnormalities of gait and mobility: Secondary | ICD-10-CM | POA: Diagnosis not present

## 2015-04-14 DIAGNOSIS — S72141D Displaced intertrochanteric fracture of right femur, subsequent encounter for closed fracture with routine healing: Secondary | ICD-10-CM | POA: Diagnosis not present

## 2015-04-14 DIAGNOSIS — R41841 Cognitive communication deficit: Secondary | ICD-10-CM | POA: Diagnosis not present

## 2015-04-14 DIAGNOSIS — M6281 Muscle weakness (generalized): Secondary | ICD-10-CM | POA: Diagnosis not present

## 2015-04-15 DIAGNOSIS — S72141D Displaced intertrochanteric fracture of right femur, subsequent encounter for closed fracture with routine healing: Secondary | ICD-10-CM | POA: Diagnosis not present

## 2015-04-15 DIAGNOSIS — R269 Unspecified abnormalities of gait and mobility: Secondary | ICD-10-CM | POA: Diagnosis not present

## 2015-04-15 DIAGNOSIS — M6281 Muscle weakness (generalized): Secondary | ICD-10-CM | POA: Diagnosis not present

## 2015-04-15 DIAGNOSIS — R41841 Cognitive communication deficit: Secondary | ICD-10-CM | POA: Diagnosis not present

## 2015-04-16 DIAGNOSIS — S72141D Displaced intertrochanteric fracture of right femur, subsequent encounter for closed fracture with routine healing: Secondary | ICD-10-CM | POA: Diagnosis not present

## 2015-04-16 DIAGNOSIS — R269 Unspecified abnormalities of gait and mobility: Secondary | ICD-10-CM | POA: Diagnosis not present

## 2015-04-16 DIAGNOSIS — M6281 Muscle weakness (generalized): Secondary | ICD-10-CM | POA: Diagnosis not present

## 2015-04-16 DIAGNOSIS — R41841 Cognitive communication deficit: Secondary | ICD-10-CM | POA: Diagnosis not present

## 2015-04-17 DIAGNOSIS — M6281 Muscle weakness (generalized): Secondary | ICD-10-CM | POA: Diagnosis not present

## 2015-04-17 DIAGNOSIS — R41841 Cognitive communication deficit: Secondary | ICD-10-CM | POA: Diagnosis not present

## 2015-04-17 DIAGNOSIS — S72141D Displaced intertrochanteric fracture of right femur, subsequent encounter for closed fracture with routine healing: Secondary | ICD-10-CM | POA: Diagnosis not present

## 2015-04-17 DIAGNOSIS — R269 Unspecified abnormalities of gait and mobility: Secondary | ICD-10-CM | POA: Diagnosis not present

## 2015-04-19 DIAGNOSIS — R269 Unspecified abnormalities of gait and mobility: Secondary | ICD-10-CM | POA: Diagnosis not present

## 2015-04-19 DIAGNOSIS — M6281 Muscle weakness (generalized): Secondary | ICD-10-CM | POA: Diagnosis not present

## 2015-04-19 DIAGNOSIS — R41841 Cognitive communication deficit: Secondary | ICD-10-CM | POA: Diagnosis not present

## 2015-04-19 DIAGNOSIS — S72141D Displaced intertrochanteric fracture of right femur, subsequent encounter for closed fracture with routine healing: Secondary | ICD-10-CM | POA: Diagnosis not present

## 2015-04-20 ENCOUNTER — Encounter: Payer: Self-pay | Admitting: Orthopedic Surgery

## 2015-04-20 ENCOUNTER — Ambulatory Visit (INDEPENDENT_AMBULATORY_CARE_PROVIDER_SITE_OTHER): Payer: Medicare Other | Admitting: Orthopedic Surgery

## 2015-04-20 ENCOUNTER — Ambulatory Visit (INDEPENDENT_AMBULATORY_CARE_PROVIDER_SITE_OTHER): Payer: Medicare Other

## 2015-04-20 VITALS — BP 128/62 | Ht 64.0 in | Wt 90.0 lb

## 2015-04-20 DIAGNOSIS — S72001D Fracture of unspecified part of neck of right femur, subsequent encounter for closed fracture with routine healing: Secondary | ICD-10-CM

## 2015-04-20 DIAGNOSIS — S72141D Displaced intertrochanteric fracture of right femur, subsequent encounter for closed fracture with routine healing: Secondary | ICD-10-CM

## 2015-04-20 DIAGNOSIS — R269 Unspecified abnormalities of gait and mobility: Secondary | ICD-10-CM | POA: Diagnosis not present

## 2015-04-20 DIAGNOSIS — M6281 Muscle weakness (generalized): Secondary | ICD-10-CM | POA: Diagnosis not present

## 2015-04-20 DIAGNOSIS — R41841 Cognitive communication deficit: Secondary | ICD-10-CM | POA: Diagnosis not present

## 2015-04-20 NOTE — Progress Notes (Signed)
Patient ID: Caitlin Riddle, female   DOB: 1941-02-12, 74 y.o.   MRN: [id number]  Follow up visit  Chief Complaint  Patient presents with  . Follow-up    6 week follow up + xray right hip, DOS 01/11/15    BP 128/62 mmHg  Ht 5\' 4"  (1.626 m)  Wt 90 lb (40.824 kg)  BMI 15.44 kg/m2  Encounter Diagnoses  Name Primary?  . Intertrochanteric fracture of right hip, closed, with routine healing, subsequent encounter Yes  . Closed right hip fracture, with routine healing, subsequent encounter       12 weeks status post intertrochanteric fracture right hip treated with gamma nail  Patient's x-rays today show fracture healing without hardware complication  Patient discharged for weightbearing follow-up as needed

## 2015-04-21 DIAGNOSIS — S72141D Displaced intertrochanteric fracture of right femur, subsequent encounter for closed fracture with routine healing: Secondary | ICD-10-CM | POA: Diagnosis not present

## 2015-04-21 DIAGNOSIS — G309 Alzheimer's disease, unspecified: Secondary | ICD-10-CM | POA: Diagnosis not present

## 2015-04-21 DIAGNOSIS — S72146A Nondisplaced intertrochanteric fracture of unspecified femur, initial encounter for closed fracture: Secondary | ICD-10-CM | POA: Diagnosis not present

## 2015-04-21 DIAGNOSIS — E46 Unspecified protein-calorie malnutrition: Secondary | ICD-10-CM | POA: Diagnosis not present

## 2015-04-21 DIAGNOSIS — W07XXXA Fall from chair, initial encounter: Secondary | ICD-10-CM | POA: Diagnosis not present

## 2015-04-21 DIAGNOSIS — M6281 Muscle weakness (generalized): Secondary | ICD-10-CM | POA: Diagnosis not present

## 2015-04-21 DIAGNOSIS — R41841 Cognitive communication deficit: Secondary | ICD-10-CM | POA: Diagnosis not present

## 2015-04-21 DIAGNOSIS — R269 Unspecified abnormalities of gait and mobility: Secondary | ICD-10-CM | POA: Diagnosis not present

## 2015-04-24 DIAGNOSIS — M6281 Muscle weakness (generalized): Secondary | ICD-10-CM | POA: Diagnosis not present

## 2015-04-24 DIAGNOSIS — R269 Unspecified abnormalities of gait and mobility: Secondary | ICD-10-CM | POA: Diagnosis not present

## 2015-04-24 DIAGNOSIS — R41841 Cognitive communication deficit: Secondary | ICD-10-CM | POA: Diagnosis not present

## 2015-04-24 DIAGNOSIS — S72141D Displaced intertrochanteric fracture of right femur, subsequent encounter for closed fracture with routine healing: Secondary | ICD-10-CM | POA: Diagnosis not present

## 2015-04-25 DIAGNOSIS — R41841 Cognitive communication deficit: Secondary | ICD-10-CM | POA: Diagnosis not present

## 2015-04-25 DIAGNOSIS — M6281 Muscle weakness (generalized): Secondary | ICD-10-CM | POA: Diagnosis not present

## 2015-04-25 DIAGNOSIS — R269 Unspecified abnormalities of gait and mobility: Secondary | ICD-10-CM | POA: Diagnosis not present

## 2015-04-25 DIAGNOSIS — S72141D Displaced intertrochanteric fracture of right femur, subsequent encounter for closed fracture with routine healing: Secondary | ICD-10-CM | POA: Diagnosis not present

## 2015-04-26 DIAGNOSIS — R41841 Cognitive communication deficit: Secondary | ICD-10-CM | POA: Diagnosis not present

## 2015-04-26 DIAGNOSIS — S72141D Displaced intertrochanteric fracture of right femur, subsequent encounter for closed fracture with routine healing: Secondary | ICD-10-CM | POA: Diagnosis not present

## 2015-04-26 DIAGNOSIS — M6281 Muscle weakness (generalized): Secondary | ICD-10-CM | POA: Diagnosis not present

## 2015-04-26 DIAGNOSIS — R269 Unspecified abnormalities of gait and mobility: Secondary | ICD-10-CM | POA: Diagnosis not present

## 2015-04-27 DIAGNOSIS — R269 Unspecified abnormalities of gait and mobility: Secondary | ICD-10-CM | POA: Diagnosis not present

## 2015-04-27 DIAGNOSIS — M6281 Muscle weakness (generalized): Secondary | ICD-10-CM | POA: Diagnosis not present

## 2015-04-27 DIAGNOSIS — S72141D Displaced intertrochanteric fracture of right femur, subsequent encounter for closed fracture with routine healing: Secondary | ICD-10-CM | POA: Diagnosis not present

## 2015-04-27 DIAGNOSIS — R41841 Cognitive communication deficit: Secondary | ICD-10-CM | POA: Diagnosis not present

## 2015-04-28 DIAGNOSIS — M6281 Muscle weakness (generalized): Secondary | ICD-10-CM | POA: Diagnosis not present

## 2015-04-28 DIAGNOSIS — R41841 Cognitive communication deficit: Secondary | ICD-10-CM | POA: Diagnosis not present

## 2015-04-28 DIAGNOSIS — R269 Unspecified abnormalities of gait and mobility: Secondary | ICD-10-CM | POA: Diagnosis not present

## 2015-04-28 DIAGNOSIS — S72141D Displaced intertrochanteric fracture of right femur, subsequent encounter for closed fracture with routine healing: Secondary | ICD-10-CM | POA: Diagnosis not present

## 2015-04-30 DIAGNOSIS — S72141D Displaced intertrochanteric fracture of right femur, subsequent encounter for closed fracture with routine healing: Secondary | ICD-10-CM | POA: Diagnosis not present

## 2015-04-30 DIAGNOSIS — R41841 Cognitive communication deficit: Secondary | ICD-10-CM | POA: Diagnosis not present

## 2015-04-30 DIAGNOSIS — M6281 Muscle weakness (generalized): Secondary | ICD-10-CM | POA: Diagnosis not present

## 2015-04-30 DIAGNOSIS — R269 Unspecified abnormalities of gait and mobility: Secondary | ICD-10-CM | POA: Diagnosis not present

## 2015-05-02 DIAGNOSIS — R41841 Cognitive communication deficit: Secondary | ICD-10-CM | POA: Diagnosis not present

## 2015-05-02 DIAGNOSIS — M6281 Muscle weakness (generalized): Secondary | ICD-10-CM | POA: Diagnosis not present

## 2015-05-02 DIAGNOSIS — R269 Unspecified abnormalities of gait and mobility: Secondary | ICD-10-CM | POA: Diagnosis not present

## 2015-05-02 DIAGNOSIS — S72141D Displaced intertrochanteric fracture of right femur, subsequent encounter for closed fracture with routine healing: Secondary | ICD-10-CM | POA: Diagnosis not present

## 2015-05-03 DIAGNOSIS — R41841 Cognitive communication deficit: Secondary | ICD-10-CM | POA: Diagnosis not present

## 2015-05-03 DIAGNOSIS — R269 Unspecified abnormalities of gait and mobility: Secondary | ICD-10-CM | POA: Diagnosis not present

## 2015-05-03 DIAGNOSIS — M6281 Muscle weakness (generalized): Secondary | ICD-10-CM | POA: Diagnosis not present

## 2015-05-03 DIAGNOSIS — S72141D Displaced intertrochanteric fracture of right femur, subsequent encounter for closed fracture with routine healing: Secondary | ICD-10-CM | POA: Diagnosis not present

## 2015-05-04 DIAGNOSIS — M6281 Muscle weakness (generalized): Secondary | ICD-10-CM | POA: Diagnosis not present

## 2015-05-04 DIAGNOSIS — S72141D Displaced intertrochanteric fracture of right femur, subsequent encounter for closed fracture with routine healing: Secondary | ICD-10-CM | POA: Diagnosis not present

## 2015-05-04 DIAGNOSIS — R41841 Cognitive communication deficit: Secondary | ICD-10-CM | POA: Diagnosis not present

## 2015-05-04 DIAGNOSIS — R269 Unspecified abnormalities of gait and mobility: Secondary | ICD-10-CM | POA: Diagnosis not present

## 2015-05-05 DIAGNOSIS — R269 Unspecified abnormalities of gait and mobility: Secondary | ICD-10-CM | POA: Diagnosis not present

## 2015-05-05 DIAGNOSIS — S72141D Displaced intertrochanteric fracture of right femur, subsequent encounter for closed fracture with routine healing: Secondary | ICD-10-CM | POA: Diagnosis not present

## 2015-05-05 DIAGNOSIS — R41841 Cognitive communication deficit: Secondary | ICD-10-CM | POA: Diagnosis not present

## 2015-05-05 DIAGNOSIS — M6281 Muscle weakness (generalized): Secondary | ICD-10-CM | POA: Diagnosis not present

## 2015-05-06 DIAGNOSIS — R269 Unspecified abnormalities of gait and mobility: Secondary | ICD-10-CM | POA: Diagnosis not present

## 2015-05-06 DIAGNOSIS — M6281 Muscle weakness (generalized): Secondary | ICD-10-CM | POA: Diagnosis not present

## 2015-05-06 DIAGNOSIS — R41841 Cognitive communication deficit: Secondary | ICD-10-CM | POA: Diagnosis not present

## 2015-05-06 DIAGNOSIS — S72141D Displaced intertrochanteric fracture of right femur, subsequent encounter for closed fracture with routine healing: Secondary | ICD-10-CM | POA: Diagnosis not present

## 2015-05-07 DIAGNOSIS — R269 Unspecified abnormalities of gait and mobility: Secondary | ICD-10-CM | POA: Diagnosis not present

## 2015-05-07 DIAGNOSIS — S72141D Displaced intertrochanteric fracture of right femur, subsequent encounter for closed fracture with routine healing: Secondary | ICD-10-CM | POA: Diagnosis not present

## 2015-05-07 DIAGNOSIS — R41841 Cognitive communication deficit: Secondary | ICD-10-CM | POA: Diagnosis not present

## 2015-05-07 DIAGNOSIS — M6281 Muscle weakness (generalized): Secondary | ICD-10-CM | POA: Diagnosis not present

## 2015-05-08 DIAGNOSIS — S72141D Displaced intertrochanteric fracture of right femur, subsequent encounter for closed fracture with routine healing: Secondary | ICD-10-CM | POA: Diagnosis not present

## 2015-05-08 DIAGNOSIS — R41841 Cognitive communication deficit: Secondary | ICD-10-CM | POA: Diagnosis not present

## 2015-05-08 DIAGNOSIS — M6281 Muscle weakness (generalized): Secondary | ICD-10-CM | POA: Diagnosis not present

## 2015-05-08 DIAGNOSIS — R269 Unspecified abnormalities of gait and mobility: Secondary | ICD-10-CM | POA: Diagnosis not present

## 2015-05-10 DIAGNOSIS — S72141D Displaced intertrochanteric fracture of right femur, subsequent encounter for closed fracture with routine healing: Secondary | ICD-10-CM | POA: Diagnosis not present

## 2015-05-10 DIAGNOSIS — R41841 Cognitive communication deficit: Secondary | ICD-10-CM | POA: Diagnosis not present

## 2015-05-10 DIAGNOSIS — M6281 Muscle weakness (generalized): Secondary | ICD-10-CM | POA: Diagnosis not present

## 2015-05-10 DIAGNOSIS — R269 Unspecified abnormalities of gait and mobility: Secondary | ICD-10-CM | POA: Diagnosis not present

## 2015-05-12 DIAGNOSIS — R41841 Cognitive communication deficit: Secondary | ICD-10-CM | POA: Diagnosis not present

## 2015-05-12 DIAGNOSIS — S72141D Displaced intertrochanteric fracture of right femur, subsequent encounter for closed fracture with routine healing: Secondary | ICD-10-CM | POA: Diagnosis not present

## 2015-05-12 DIAGNOSIS — R269 Unspecified abnormalities of gait and mobility: Secondary | ICD-10-CM | POA: Diagnosis not present

## 2015-05-12 DIAGNOSIS — M6281 Muscle weakness (generalized): Secondary | ICD-10-CM | POA: Diagnosis not present

## 2015-05-13 DIAGNOSIS — R269 Unspecified abnormalities of gait and mobility: Secondary | ICD-10-CM | POA: Diagnosis not present

## 2015-05-13 DIAGNOSIS — R41841 Cognitive communication deficit: Secondary | ICD-10-CM | POA: Diagnosis not present

## 2015-05-13 DIAGNOSIS — M6281 Muscle weakness (generalized): Secondary | ICD-10-CM | POA: Diagnosis not present

## 2015-05-13 DIAGNOSIS — S72141D Displaced intertrochanteric fracture of right femur, subsequent encounter for closed fracture with routine healing: Secondary | ICD-10-CM | POA: Diagnosis not present

## 2015-05-14 DIAGNOSIS — S72141D Displaced intertrochanteric fracture of right femur, subsequent encounter for closed fracture with routine healing: Secondary | ICD-10-CM | POA: Diagnosis not present

## 2015-05-14 DIAGNOSIS — M6281 Muscle weakness (generalized): Secondary | ICD-10-CM | POA: Diagnosis not present

## 2015-05-14 DIAGNOSIS — R41841 Cognitive communication deficit: Secondary | ICD-10-CM | POA: Diagnosis not present

## 2015-05-14 DIAGNOSIS — R269 Unspecified abnormalities of gait and mobility: Secondary | ICD-10-CM | POA: Diagnosis not present

## 2015-05-15 DIAGNOSIS — M6281 Muscle weakness (generalized): Secondary | ICD-10-CM | POA: Diagnosis not present

## 2015-05-15 DIAGNOSIS — R269 Unspecified abnormalities of gait and mobility: Secondary | ICD-10-CM | POA: Diagnosis not present

## 2015-05-15 DIAGNOSIS — S72141D Displaced intertrochanteric fracture of right femur, subsequent encounter for closed fracture with routine healing: Secondary | ICD-10-CM | POA: Diagnosis not present

## 2015-05-15 DIAGNOSIS — R41841 Cognitive communication deficit: Secondary | ICD-10-CM | POA: Diagnosis not present

## 2015-05-17 DIAGNOSIS — R269 Unspecified abnormalities of gait and mobility: Secondary | ICD-10-CM | POA: Diagnosis not present

## 2015-05-17 DIAGNOSIS — S72141D Displaced intertrochanteric fracture of right femur, subsequent encounter for closed fracture with routine healing: Secondary | ICD-10-CM | POA: Diagnosis not present

## 2015-05-17 DIAGNOSIS — R41841 Cognitive communication deficit: Secondary | ICD-10-CM | POA: Diagnosis not present

## 2015-05-17 DIAGNOSIS — M6281 Muscle weakness (generalized): Secondary | ICD-10-CM | POA: Diagnosis not present

## 2015-05-18 DIAGNOSIS — R269 Unspecified abnormalities of gait and mobility: Secondary | ICD-10-CM | POA: Diagnosis not present

## 2015-05-18 DIAGNOSIS — R41841 Cognitive communication deficit: Secondary | ICD-10-CM | POA: Diagnosis not present

## 2015-05-18 DIAGNOSIS — S72141D Displaced intertrochanteric fracture of right femur, subsequent encounter for closed fracture with routine healing: Secondary | ICD-10-CM | POA: Diagnosis not present

## 2015-05-18 DIAGNOSIS — M6281 Muscle weakness (generalized): Secondary | ICD-10-CM | POA: Diagnosis not present

## 2015-05-19 DIAGNOSIS — M6281 Muscle weakness (generalized): Secondary | ICD-10-CM | POA: Diagnosis not present

## 2015-05-19 DIAGNOSIS — R269 Unspecified abnormalities of gait and mobility: Secondary | ICD-10-CM | POA: Diagnosis not present

## 2015-05-19 DIAGNOSIS — S72141D Displaced intertrochanteric fracture of right femur, subsequent encounter for closed fracture with routine healing: Secondary | ICD-10-CM | POA: Diagnosis not present

## 2015-05-19 DIAGNOSIS — R41841 Cognitive communication deficit: Secondary | ICD-10-CM | POA: Diagnosis not present

## 2015-06-26 DIAGNOSIS — Z79899 Other long term (current) drug therapy: Secondary | ICD-10-CM | POA: Diagnosis not present

## 2015-06-26 DIAGNOSIS — E559 Vitamin D deficiency, unspecified: Secondary | ICD-10-CM | POA: Diagnosis not present

## 2015-06-26 DIAGNOSIS — E46 Unspecified protein-calorie malnutrition: Secondary | ICD-10-CM | POA: Diagnosis not present

## 2015-06-26 DIAGNOSIS — E039 Hypothyroidism, unspecified: Secondary | ICD-10-CM | POA: Diagnosis not present

## 2015-06-26 DIAGNOSIS — D649 Anemia, unspecified: Secondary | ICD-10-CM | POA: Diagnosis not present

## 2015-06-26 DIAGNOSIS — R197 Diarrhea, unspecified: Secondary | ICD-10-CM | POA: Diagnosis not present

## 2015-06-26 DIAGNOSIS — E785 Hyperlipidemia, unspecified: Secondary | ICD-10-CM | POA: Diagnosis not present

## 2015-06-26 DIAGNOSIS — N179 Acute kidney failure, unspecified: Secondary | ICD-10-CM | POA: Diagnosis not present

## 2015-06-27 DIAGNOSIS — S72141D Displaced intertrochanteric fracture of right femur, subsequent encounter for closed fracture with routine healing: Secondary | ICD-10-CM | POA: Diagnosis not present

## 2015-06-27 DIAGNOSIS — D72829 Elevated white blood cell count, unspecified: Secondary | ICD-10-CM | POA: Diagnosis not present

## 2015-06-27 DIAGNOSIS — D649 Anemia, unspecified: Secondary | ICD-10-CM | POA: Diagnosis not present

## 2015-06-27 DIAGNOSIS — R6889 Other general symptoms and signs: Secondary | ICD-10-CM | POA: Diagnosis not present

## 2015-06-29 DIAGNOSIS — R6889 Other general symptoms and signs: Secondary | ICD-10-CM | POA: Diagnosis not present

## 2015-06-29 DIAGNOSIS — D72829 Elevated white blood cell count, unspecified: Secondary | ICD-10-CM | POA: Diagnosis not present

## 2015-06-29 DIAGNOSIS — D649 Anemia, unspecified: Secondary | ICD-10-CM | POA: Diagnosis not present

## 2015-06-29 DIAGNOSIS — G309 Alzheimer's disease, unspecified: Secondary | ICD-10-CM | POA: Diagnosis not present

## 2015-06-30 DIAGNOSIS — R319 Hematuria, unspecified: Secondary | ICD-10-CM | POA: Diagnosis not present

## 2015-06-30 DIAGNOSIS — D72829 Elevated white blood cell count, unspecified: Secondary | ICD-10-CM | POA: Diagnosis not present

## 2015-06-30 DIAGNOSIS — R6889 Other general symptoms and signs: Secondary | ICD-10-CM | POA: Diagnosis not present

## 2015-06-30 DIAGNOSIS — G309 Alzheimer's disease, unspecified: Secondary | ICD-10-CM | POA: Diagnosis not present

## 2015-07-06 DIAGNOSIS — R1111 Vomiting without nausea: Secondary | ICD-10-CM | POA: Diagnosis not present

## 2015-07-06 DIAGNOSIS — Z79899 Other long term (current) drug therapy: Secondary | ICD-10-CM | POA: Diagnosis not present

## 2015-07-06 DIAGNOSIS — N179 Acute kidney failure, unspecified: Secondary | ICD-10-CM | POA: Diagnosis not present

## 2015-07-06 DIAGNOSIS — D72829 Elevated white blood cell count, unspecified: Secondary | ICD-10-CM | POA: Diagnosis not present

## 2015-07-06 DIAGNOSIS — E875 Hyperkalemia: Secondary | ICD-10-CM | POA: Diagnosis not present

## 2015-07-06 DIAGNOSIS — M549 Dorsalgia, unspecified: Secondary | ICD-10-CM | POA: Diagnosis not present

## 2015-07-06 DIAGNOSIS — D649 Anemia, unspecified: Secondary | ICD-10-CM | POA: Diagnosis not present

## 2015-07-07 DIAGNOSIS — R6889 Other general symptoms and signs: Secondary | ICD-10-CM | POA: Diagnosis not present

## 2015-07-07 DIAGNOSIS — E875 Hyperkalemia: Secondary | ICD-10-CM | POA: Diagnosis not present

## 2015-07-07 DIAGNOSIS — N390 Urinary tract infection, site not specified: Secondary | ICD-10-CM | POA: Diagnosis not present

## 2015-07-07 DIAGNOSIS — E870 Hyperosmolality and hypernatremia: Secondary | ICD-10-CM | POA: Diagnosis not present

## 2015-07-07 DIAGNOSIS — R319 Hematuria, unspecified: Secondary | ICD-10-CM | POA: Diagnosis not present

## 2015-07-07 DIAGNOSIS — M545 Low back pain: Secondary | ICD-10-CM | POA: Diagnosis not present

## 2015-07-07 DIAGNOSIS — R1111 Vomiting without nausea: Secondary | ICD-10-CM | POA: Diagnosis not present

## 2015-07-07 DIAGNOSIS — M549 Dorsalgia, unspecified: Secondary | ICD-10-CM | POA: Diagnosis not present

## 2015-07-07 DIAGNOSIS — N179 Acute kidney failure, unspecified: Secondary | ICD-10-CM | POA: Diagnosis not present

## 2015-07-07 DIAGNOSIS — D649 Anemia, unspecified: Secondary | ICD-10-CM | POA: Diagnosis not present

## 2015-07-07 DIAGNOSIS — D72829 Elevated white blood cell count, unspecified: Secondary | ICD-10-CM | POA: Diagnosis not present

## 2015-07-07 DIAGNOSIS — Z79899 Other long term (current) drug therapy: Secondary | ICD-10-CM | POA: Diagnosis not present

## 2015-07-08 DIAGNOSIS — Z79899 Other long term (current) drug therapy: Secondary | ICD-10-CM | POA: Diagnosis not present

## 2015-07-08 DIAGNOSIS — E870 Hyperosmolality and hypernatremia: Secondary | ICD-10-CM | POA: Diagnosis not present

## 2015-07-08 DIAGNOSIS — N179 Acute kidney failure, unspecified: Secondary | ICD-10-CM | POA: Diagnosis not present

## 2015-07-08 DIAGNOSIS — D649 Anemia, unspecified: Secondary | ICD-10-CM | POA: Diagnosis not present

## 2015-07-08 DIAGNOSIS — R6889 Other general symptoms and signs: Secondary | ICD-10-CM | POA: Diagnosis not present

## 2015-07-08 DIAGNOSIS — D72829 Elevated white blood cell count, unspecified: Secondary | ICD-10-CM | POA: Diagnosis not present

## 2015-07-09 DIAGNOSIS — E870 Hyperosmolality and hypernatremia: Secondary | ICD-10-CM | POA: Diagnosis not present

## 2015-07-09 DIAGNOSIS — D649 Anemia, unspecified: Secondary | ICD-10-CM | POA: Diagnosis not present

## 2015-07-09 DIAGNOSIS — D72829 Elevated white blood cell count, unspecified: Secondary | ICD-10-CM | POA: Diagnosis not present

## 2015-07-09 DIAGNOSIS — E875 Hyperkalemia: Secondary | ICD-10-CM | POA: Diagnosis not present

## 2015-07-09 DIAGNOSIS — M549 Dorsalgia, unspecified: Secondary | ICD-10-CM | POA: Diagnosis not present

## 2015-07-09 DIAGNOSIS — Z79899 Other long term (current) drug therapy: Secondary | ICD-10-CM | POA: Diagnosis not present

## 2015-07-10 DIAGNOSIS — R1111 Vomiting without nausea: Secondary | ICD-10-CM | POA: Diagnosis not present

## 2015-07-10 DIAGNOSIS — R6889 Other general symptoms and signs: Secondary | ICD-10-CM | POA: Diagnosis not present

## 2015-07-10 DIAGNOSIS — D72829 Elevated white blood cell count, unspecified: Secondary | ICD-10-CM | POA: Diagnosis not present

## 2015-07-10 DIAGNOSIS — N183 Chronic kidney disease, stage 3 (moderate): Secondary | ICD-10-CM | POA: Diagnosis not present

## 2015-07-10 DIAGNOSIS — M25561 Pain in right knee: Secondary | ICD-10-CM | POA: Diagnosis not present

## 2015-07-10 DIAGNOSIS — E870 Hyperosmolality and hypernatremia: Secondary | ICD-10-CM | POA: Diagnosis not present

## 2015-07-10 DIAGNOSIS — N179 Acute kidney failure, unspecified: Secondary | ICD-10-CM | POA: Diagnosis not present

## 2015-07-10 DIAGNOSIS — Z79899 Other long term (current) drug therapy: Secondary | ICD-10-CM | POA: Diagnosis not present

## 2015-07-10 DIAGNOSIS — D649 Anemia, unspecified: Secondary | ICD-10-CM | POA: Diagnosis not present

## 2015-07-11 DIAGNOSIS — M25561 Pain in right knee: Secondary | ICD-10-CM | POA: Diagnosis not present

## 2015-07-11 DIAGNOSIS — N183 Chronic kidney disease, stage 3 (moderate): Secondary | ICD-10-CM | POA: Diagnosis not present

## 2015-07-11 DIAGNOSIS — M19071 Primary osteoarthritis, right ankle and foot: Secondary | ICD-10-CM | POA: Diagnosis not present

## 2015-07-11 DIAGNOSIS — D72829 Elevated white blood cell count, unspecified: Secondary | ICD-10-CM | POA: Diagnosis not present

## 2015-07-12 DIAGNOSIS — M25561 Pain in right knee: Secondary | ICD-10-CM | POA: Diagnosis not present

## 2015-07-12 DIAGNOSIS — R6889 Other general symptoms and signs: Secondary | ICD-10-CM | POA: Diagnosis not present

## 2015-07-12 DIAGNOSIS — M79671 Pain in right foot: Secondary | ICD-10-CM | POA: Diagnosis not present

## 2015-07-12 DIAGNOSIS — D72829 Elevated white blood cell count, unspecified: Secondary | ICD-10-CM | POA: Diagnosis not present

## 2015-07-13 ENCOUNTER — Telehealth: Payer: Self-pay | Admitting: Orthopedic Surgery

## 2015-07-13 ENCOUNTER — Emergency Department (HOSPITAL_COMMUNITY)
Admission: EM | Admit: 2015-07-13 | Discharge: 2015-07-13 | Disposition: A | Discharge status: Home / Self Care | Payer: Medicare Other | Attending: Emergency Medicine | Admitting: Emergency Medicine

## 2015-07-13 ENCOUNTER — Ambulatory Visit: Payer: Medicare Other | Admitting: Orthopedic Surgery

## 2015-07-13 ENCOUNTER — Emergency Department (HOSPITAL_COMMUNITY): Payer: Medicare Other

## 2015-07-13 ENCOUNTER — Encounter (HOSPITAL_COMMUNITY): Payer: Self-pay | Admitting: *Deleted

## 2015-07-13 DIAGNOSIS — E785 Hyperlipidemia, unspecified: Secondary | ICD-10-CM | POA: Insufficient documentation

## 2015-07-13 DIAGNOSIS — M25561 Pain in right knee: Secondary | ICD-10-CM | POA: Diagnosis not present

## 2015-07-13 DIAGNOSIS — G309 Alzheimer's disease, unspecified: Secondary | ICD-10-CM | POA: Diagnosis not present

## 2015-07-13 DIAGNOSIS — M7989 Other specified soft tissue disorders: Secondary | ICD-10-CM | POA: Insufficient documentation

## 2015-07-13 DIAGNOSIS — R404 Transient alteration of awareness: Secondary | ICD-10-CM | POA: Diagnosis not present

## 2015-07-13 DIAGNOSIS — I129 Hypertensive chronic kidney disease with stage 1 through stage 4 chronic kidney disease, or unspecified chronic kidney disease: Secondary | ICD-10-CM | POA: Diagnosis not present

## 2015-07-13 DIAGNOSIS — M1990 Unspecified osteoarthritis, unspecified site: Secondary | ICD-10-CM | POA: Diagnosis not present

## 2015-07-13 DIAGNOSIS — N189 Chronic kidney disease, unspecified: Secondary | ICD-10-CM | POA: Insufficient documentation

## 2015-07-13 DIAGNOSIS — Z9889 Other specified postprocedural states: Secondary | ICD-10-CM | POA: Diagnosis not present

## 2015-07-13 DIAGNOSIS — F4489 Other dissociative and conversion disorders: Secondary | ICD-10-CM | POA: Diagnosis not present

## 2015-07-13 DIAGNOSIS — H5462 Unqualified visual loss, left eye, normal vision right eye: Secondary | ICD-10-CM | POA: Diagnosis not present

## 2015-07-13 DIAGNOSIS — M25562 Pain in left knee: Secondary | ICD-10-CM | POA: Diagnosis not present

## 2015-07-13 DIAGNOSIS — M79604 Pain in right leg: Secondary | ICD-10-CM | POA: Insufficient documentation

## 2015-07-13 DIAGNOSIS — M79661 Pain in right lower leg: Secondary | ICD-10-CM | POA: Diagnosis not present

## 2015-07-13 DIAGNOSIS — Z794 Long term (current) use of insulin: Secondary | ICD-10-CM | POA: Insufficient documentation

## 2015-07-13 DIAGNOSIS — F0280 Dementia in other diseases classified elsewhere without behavioral disturbance: Secondary | ICD-10-CM | POA: Insufficient documentation

## 2015-07-13 DIAGNOSIS — Z79899 Other long term (current) drug therapy: Secondary | ICD-10-CM | POA: Insufficient documentation

## 2015-07-13 DIAGNOSIS — E119 Type 2 diabetes mellitus without complications: Secondary | ICD-10-CM | POA: Diagnosis not present

## 2015-07-13 DIAGNOSIS — Z791 Long term (current) use of non-steroidal anti-inflammatories (NSAID): Secondary | ICD-10-CM | POA: Diagnosis not present

## 2015-07-13 DIAGNOSIS — R531 Weakness: Secondary | ICD-10-CM | POA: Diagnosis not present

## 2015-07-13 HISTORY — DX: Dementia in other diseases classified elsewhere, unspecified severity, without behavioral disturbance, psychotic disturbance, mood disturbance, and anxiety: F02.80

## 2015-07-13 HISTORY — DX: Alzheimer's disease, unspecified: G30.9

## 2015-07-13 NOTE — ED Notes (Signed)
Pt was given a meal and drink, assisted by sister for consumption.  Gave report to [place], and [facility] has been called to transport patient back to facility.

## 2015-07-13 NOTE — Telephone Encounter (Signed)
Patient advised per our office when arrived with [facility] respresentative for an appointment today, 07/13/15, scheduled yesterday, 07/12/15, due to having no work-up or treatment as of yet for the new problem of tib-fib injury, to be transported to [facility] Emergency room for evaluation, Xrays, and bracing or immobilization as recommended.  [facility] staff member voiced understanding. States patient had mobile Xrays were done, however, no films or reports were presented. Patient has been re-scheduled for first available clinic date, 07/18/15; facility and patient's family aware.

## 2015-07-13 NOTE — ED Notes (Signed)
Resident of [facility]. Nurse at [facility] states that Pt was sent over for xrays of leg due to possible displaced/dislocated Tib/Fib. States pt had an appt with Dr. Romeo Apple today but he would not see her due to lack of appropriate xrays. [facility] staff states pain to left leg. EMS stated they were told right leg. No known recent injury per [facility] staff.

## 2015-07-13 NOTE — ED Provider Notes (Signed)
CSN: [id number]     Arrival date & time 07/13/15  1656 History   First MD Initiated Contact with Patient 07/13/15 1701     Chief Complaint  Patient presents with  . Leg Pain     (Consider location/radiation/quality/duration/timing/severity/associated sxs/prior Treatment) HPI Comments:  Level V caveat for dementia. Patient sent from nursing home with questionable injury to right knee and lower leg. Sr. At bedside provides most of history. She states patient intermittent complains of right knee pain but does not recall any recent falls. She's been  Not ambulatory since she had right hip surgery in March. Nursing home staff apparently performed x-rays which showed a questionable fracture. She was sent to see Dr. Romeo Apple today but he did not see her as x-rays were not adequate. She was sent to the ED for x-rays. Patient denies any pain in her leg. She is oriented 2. She is not on any blood thinners. She does not recall any falls.  The history is provided by the patient.    Past Medical History  Diagnosis Date  . Diabetes mellitus   . Hypertension   . Arthritis   . Hyperlipidemia   . Vision loss of left eye   . Arthralgia   . Renal insufficiency   . Dementia   . CKD (chronic kidney disease)     stage II  . Alzheimer disease    Past Surgical History  Procedure Laterality Date  . Total abdominal hysterectomy    . Colonoscopy    . Esophagogastroduodenoscopy    . Intramedullary (im) nail intertrochanteric Right 01/11/2015    Procedure: INTERNAL FIXATION RIGHT HIP;  Surgeon: Vickki Hearing, MD;  Location: [facility];  Service: Orthopedics;  Laterality: Right;   No family history on file. Social History  Substance Use Topics  . Smoking status: Never Smoker   . Smokeless tobacco: Never Used  . Alcohol Use: No   OB History    No data available     Review of Systems  Unable to perform ROS: Dementia      Allergies  Review of patient's allergies indicates no known  allergies.  Home Medications   Prior to Admission medications   Medication Sig Start Date End Date Taking? Authorizing Provider  Amino Acids-Protein Hydrolys (FEEDING SUPPLEMENT, PRO-STAT SUGAR FREE 64,) LIQD Take 30 mLs by mouth 2 (two) times daily.   Yes Historical Provider, MD  cefTRIAXone (ROCEPHIN) 1 G injection Inject 1 g into the muscle daily. Course to end on 07/14/2015   Yes Historical Provider, MD  Cholecalciferol (VITAMIN D3) 5000 UNITS CAPS Take 5,000 Units by mouth daily.   Yes Historical Provider, MD  HYDROcodone-acetaminophen (NORCO/VICODIN) 5-325 MG per tablet Take 1-2 tablets by mouth every 6 (six) hours as needed for moderate pain. 01/13/15  Yes Lesle Chris Black, NP  insulin aspart (NOVOLOG) 100 UNIT/ML injection Inject 1-6 Units into the skin 3 (three) times daily before meals. 150-200= 1 unit 201-250= 2 unit 251-300= 3 unit 301-350= 4 unit 351-400= 6 unit  Over 400= call MD   Yes Historical Provider, MD  levothyroxine (SYNTHROID, LEVOTHROID) 25 MCG tablet Take 1 tablet (25 mcg total) by mouth daily before breakfast. 12/28/13  Yes Babs Sciara, MD  memantine (NAMENDA) 5 MG tablet Take 1 tablet (5 mg total) by mouth 2 (two) times daily. 11/29/13  Yes Babs Sciara, MD  metoprolol succinate (TOPROL-XL) 25 MG 24 hr tablet Take 0.5 tablets (12.5 mg total) by mouth daily. 01/13/15  Yes Clydie Braun  M Black, NP  ondansetron (ZOFRAN) 4 MG tablet Take 4 mg by mouth every 6 (six) hours as needed for nausea or vomiting.   Yes Historical Provider, MD  pravastatin (PRAVACHOL) 20 MG tablet TAKE 1 TABLET BY MOUTH ONCE DAILY. 10/25/14  Yes Merlyn Albert, MD  Propylene Glycol (SYSTANE BALANCE OP) Apply 1 drop to eye 3 (three) times daily.   Yes Historical Provider, MD  traMADol (ULTRAM) 50 MG tablet Take 1 tablet (50 mg total) by mouth 2 (two) times daily. 01/13/15  Yes Lesle Chris Black, NP   BP 134/73 mmHg  Pulse 72  Temp(Src) 98 F (36.7 C) (Oral)  Resp 16  Ht 5\' 6"  (1.676 m)  Wt 90 lb (40.824 kg)   BMI 14.53 kg/m2  SpO2 100% Physical Exam  Constitutional: She is oriented to person, place, and time. She appears well-developed and well-nourished. No distress.  HENT:  Head: Normocephalic and atraumatic.  Mouth/Throat: Oropharynx is clear and moist. No oropharyngeal exudate.  Eyes: Conjunctivae and EOM are normal. Pupils are equal, round, and reactive to light.  Neck: Normal range of motion. Neck supple.  No meningismus.  Cardiovascular: Normal rate, regular rhythm, normal heart sounds and intact distal pulses.   No murmur heard. Pulmonary/Chest: Effort normal and breath sounds normal. No respiratory distress.  Abdominal: Soft. There is no tenderness. There is no rebound and no guarding.  Musculoskeletal: Normal range of motion. She exhibits tenderness. She exhibits no edema.   Swelling to right knee without obvious deformity. Flexion and extension intact. Range of motion of hip intact. Distal feet are cool but capillary refill is intact. Unable to palpate pulses but PT pulses present with Doppler.  Full range of motion of left leg without pain.  Neurological: She is alert and oriented to person, place, and time. No cranial nerve deficit. She exhibits normal muscle tone. Coordination normal.  Skin: Skin is warm.  Psychiatric: She has a normal mood and affect. Her behavior is normal.  Nursing note and vitals reviewed.   ED Course  Procedures (including critical care time) Labs Review Labs Reviewed - No data to display  Imaging Review Dg Tibia/fibula Right  07/13/2015   CLINICAL DATA:  Right leg pain  EXAM: RIGHT TIBIA AND FIBULA - 2 VIEW  COMPARISON:  None.  FINDINGS: No acute fracture. No dislocation. There is deformity of the proximal tibial and fibula which have a chronic appearance. Osteopenia.  IMPRESSION: No acute bony pathology.   Electronically Signed   By: 09/12/2015 M.D.   On: 07/13/2015 18:16   Dg Knee Complete 4 Views Left  07/13/2015   CLINICAL DATA:  Left knee pain   EXAM: LEFT KNEE - COMPLETE 4+ VIEW  COMPARISON:  None.  FINDINGS: Moderate tricompartment osteoarthritic change. No acute fracture. No dislocation.  IMPRESSION: No acute bony pathology.   Electronically Signed   By: 09/12/2015 M.D.   On: 07/13/2015 18:17   Dg Knee Complete 4 Views Right  07/13/2015   CLINICAL DATA:  Right leg pain, no known injury, initial encounter  EXAM: RIGHT KNEE - COMPLETE 4+ VIEW  COMPARISON:  01/10/2015  FINDINGS: Old healed proximal tibial and fibular fractures are seen. Progressive degenerative changes noted in the knee joint with narrowing in all 3 joint compartments. No joint effusion is seen. No acute fracture is noted.  IMPRESSION: Old proximal tibial and fibular fractures.  Degenerative changes without acute abnormality.   Electronically Signed   By: 03/12/2015.D.  On: 07/13/2015 18:21   Dg Hip Unilat With Pelvis 2-3 Views Right  07/13/2015   CLINICAL DATA:  Right leg pain  EXAM: DG HIP (WITH OR WITHOUT PELVIS) 2-3V RIGHT  COMPARISON:  03/02/2015  FINDINGS: Intra medullary rod and dynamic compression screw in the proximal right femur. No acute fracture. No dislocation. Osteopenia. Chronic deformity of the right superior and inferior pubic rami. Severe degenerative change at the hip joints.  IMPRESSION: No acute bony pathology.   Electronically Signed   By: Jolaine Click M.D.   On: 07/13/2015 18:15   I have personally reviewed and evaluated these images and lab results as part of my medical decision-making.   EKG Interpretation None      MDM   Final diagnoses:  None   right knee pain and swelling with questionable fracture. We'll need to repeat x-rays./  No history of recent fall.  Herby Abraham shows old fracture of R tib fib. No new fractures.   D/w Dr. Romeo Apple.  He will see patient in followup.   Xray findings from ECF Xrays appear to be chronic and healing fractures.   No new abnormality.  Glynn Octave, MD 07/13/15 2303

## 2015-07-13 NOTE — Discharge Instructions (Signed)
Joint Pain  x-ray today shows no new fracture. Follow-up with your doctor and Dr. Romeo Apple. Return to the ED if you develop new or worsening symptoms. Joint pain, which is also called arthralgia, can be caused by many things. Joint pain often goes away when you follow your health care provider's instructions for relieving pain at home. However, joint pain can also be caused by conditions that require further treatment. Common causes of joint pain include:  Bruising in the area of the joint.  Overuse of the joint.  Wear and tear on the joints that occur with aging (osteoarthritis).  Various other forms of arthritis.  A buildup of a crystal form of uric acid in the joint (gout).  Infections of the joint (septic arthritis) or of the bone (osteomyelitis). Your health care provider may recommend medicine to help with the pain. If your joint pain continues, additional tests may be needed to diagnose your condition. HOME CARE INSTRUCTIONS Watch your condition for any changes. Follow these instructions as directed to lessen the pain that you are feeling.  Take medicines only as directed by your health care provider.  Rest the affected area for as long as your health care provider says that you should. If directed to do so, raise the painful joint above the level of your heart while you are sitting or lying down.  Do not do things that cause or worsen pain.  If directed, apply ice to the painful area:  Put ice in a plastic bag.  Place a towel between your skin and the bag.  Leave the ice on for 20 minutes, 2-3 times per day.  Wear an elastic bandage, splint, or sling as directed by your health care provider. Loosen the elastic bandage or splint if your fingers or toes become numb and tingle, or if they turn cold and blue.  Begin exercising or stretching the affected area as directed by your health care provider. Ask your health care provider what types of exercise are safe for you.  Keep  all follow-up visits as directed by your health care provider. This is important. SEEK MEDICAL CARE IF:  Your pain increases, and medicine does not help.  Your joint pain does not improve within 3 days.  You have increased bruising or swelling.  You have a fever.  You lose 10 lb (4.5 kg) or more without trying. SEEK IMMEDIATE MEDICAL CARE IF:  You are not able to move the joint.  Your fingers or toes become numb or they turn cold and blue.   This information is not intended to replace advice given to you by your health care provider. Make sure you discuss any questions you have with your health care provider.   Document Released: 09/23/2005 Document Revised: 10/14/2014 Document Reviewed: 07/05/2014 Elsevier Interactive Patient Education [employer]

## 2015-07-15 DIAGNOSIS — M79671 Pain in right foot: Secondary | ICD-10-CM | POA: Diagnosis not present

## 2015-07-15 DIAGNOSIS — D72829 Elevated white blood cell count, unspecified: Secondary | ICD-10-CM | POA: Diagnosis not present

## 2015-07-15 DIAGNOSIS — M25561 Pain in right knee: Secondary | ICD-10-CM | POA: Diagnosis not present

## 2015-07-18 ENCOUNTER — Ambulatory Visit: Payer: Medicare Other | Admitting: Orthopedic Surgery

## 2015-07-19 ENCOUNTER — Other Ambulatory Visit (HOSPITAL_COMMUNITY): Payer: Self-pay | Admitting: Internal Medicine

## 2015-07-19 DIAGNOSIS — N179 Acute kidney failure, unspecified: Secondary | ICD-10-CM | POA: Diagnosis not present

## 2015-07-19 DIAGNOSIS — R6889 Other general symptoms and signs: Secondary | ICD-10-CM | POA: Diagnosis not present

## 2015-07-19 DIAGNOSIS — I10 Essential (primary) hypertension: Secondary | ICD-10-CM | POA: Diagnosis not present

## 2015-07-19 DIAGNOSIS — D72829 Elevated white blood cell count, unspecified: Secondary | ICD-10-CM | POA: Diagnosis not present

## 2015-07-19 DIAGNOSIS — K529 Noninfective gastroenteritis and colitis, unspecified: Secondary | ICD-10-CM | POA: Diagnosis not present

## 2015-07-19 DIAGNOSIS — D649 Anemia, unspecified: Secondary | ICD-10-CM | POA: Diagnosis not present

## 2015-07-19 DIAGNOSIS — R109 Unspecified abdominal pain: Secondary | ICD-10-CM | POA: Diagnosis not present

## 2015-07-19 DIAGNOSIS — Z79899 Other long term (current) drug therapy: Secondary | ICD-10-CM | POA: Diagnosis not present

## 2015-07-19 DIAGNOSIS — R1084 Generalized abdominal pain: Secondary | ICD-10-CM

## 2015-07-19 DIAGNOSIS — S72141D Displaced intertrochanteric fracture of right femur, subsequent encounter for closed fracture with routine healing: Secondary | ICD-10-CM | POA: Diagnosis not present

## 2015-07-19 DIAGNOSIS — R197 Diarrhea, unspecified: Secondary | ICD-10-CM | POA: Diagnosis not present

## 2015-07-20 ENCOUNTER — Other Ambulatory Visit (HOSPITAL_COMMUNITY): Payer: Self-pay | Admitting: Internal Medicine

## 2015-07-20 ENCOUNTER — Ambulatory Visit (HOSPITAL_COMMUNITY)
Admission: RE | Admit: 2015-07-20 | Discharge: 2015-07-20 | Disposition: A | Discharge status: Home / Self Care | Payer: Medicare Other | Source: Ambulatory Visit | Attending: Internal Medicine | Admitting: Internal Medicine

## 2015-07-20 DIAGNOSIS — Z79899 Other long term (current) drug therapy: Secondary | ICD-10-CM | POA: Diagnosis not present

## 2015-07-20 DIAGNOSIS — D649 Anemia, unspecified: Secondary | ICD-10-CM | POA: Diagnosis not present

## 2015-07-20 DIAGNOSIS — R197 Diarrhea, unspecified: Secondary | ICD-10-CM | POA: Diagnosis not present

## 2015-07-20 DIAGNOSIS — K529 Noninfective gastroenteritis and colitis, unspecified: Secondary | ICD-10-CM | POA: Diagnosis not present

## 2015-07-20 DIAGNOSIS — R109 Unspecified abdominal pain: Secondary | ICD-10-CM | POA: Diagnosis not present

## 2015-07-20 DIAGNOSIS — R1084 Generalized abdominal pain: Secondary | ICD-10-CM

## 2015-07-20 DIAGNOSIS — D72829 Elevated white blood cell count, unspecified: Secondary | ICD-10-CM | POA: Diagnosis not present

## 2015-07-20 DIAGNOSIS — N200 Calculus of kidney: Secondary | ICD-10-CM | POA: Diagnosis not present

## 2015-07-20 DIAGNOSIS — R319 Hematuria, unspecified: Secondary | ICD-10-CM | POA: Diagnosis not present

## 2015-07-20 DIAGNOSIS — N390 Urinary tract infection, site not specified: Secondary | ICD-10-CM | POA: Diagnosis not present

## 2015-07-21 DIAGNOSIS — R109 Unspecified abdominal pain: Secondary | ICD-10-CM | POA: Diagnosis not present

## 2015-07-21 DIAGNOSIS — A047 Enterocolitis due to Clostridium difficile: Secondary | ICD-10-CM | POA: Diagnosis not present

## 2015-07-21 DIAGNOSIS — N179 Acute kidney failure, unspecified: Secondary | ICD-10-CM | POA: Diagnosis not present

## 2015-07-21 DIAGNOSIS — R197 Diarrhea, unspecified: Secondary | ICD-10-CM | POA: Diagnosis not present

## 2015-07-21 DIAGNOSIS — D649 Anemia, unspecified: Secondary | ICD-10-CM | POA: Diagnosis not present

## 2015-07-21 DIAGNOSIS — D72829 Elevated white blood cell count, unspecified: Secondary | ICD-10-CM | POA: Diagnosis not present

## 2015-07-21 DIAGNOSIS — M2550 Pain in unspecified joint: Secondary | ICD-10-CM | POA: Diagnosis not present

## 2015-07-21 DIAGNOSIS — K529 Noninfective gastroenteritis and colitis, unspecified: Secondary | ICD-10-CM | POA: Diagnosis not present

## 2015-07-21 DIAGNOSIS — E7800 Pure hypercholesterolemia, unspecified: Secondary | ICD-10-CM | POA: Diagnosis not present

## 2015-07-21 DIAGNOSIS — R6889 Other general symptoms and signs: Secondary | ICD-10-CM | POA: Diagnosis not present

## 2015-07-21 DIAGNOSIS — Z79899 Other long term (current) drug therapy: Secondary | ICD-10-CM | POA: Diagnosis not present

## 2015-07-24 DIAGNOSIS — R739 Hyperglycemia, unspecified: Secondary | ICD-10-CM | POA: Diagnosis not present

## 2015-07-24 DIAGNOSIS — A047 Enterocolitis due to Clostridium difficile: Secondary | ICD-10-CM | POA: Diagnosis not present

## 2015-08-02 DIAGNOSIS — S72141D Displaced intertrochanteric fracture of right femur, subsequent encounter for closed fracture with routine healing: Secondary | ICD-10-CM | POA: Diagnosis not present

## 2015-08-02 DIAGNOSIS — N179 Acute kidney failure, unspecified: Secondary | ICD-10-CM | POA: Diagnosis not present

## 2015-08-03 DIAGNOSIS — S72141D Displaced intertrochanteric fracture of right femur, subsequent encounter for closed fracture with routine healing: Secondary | ICD-10-CM | POA: Diagnosis not present

## 2015-08-03 DIAGNOSIS — M6281 Muscle weakness (generalized): Secondary | ICD-10-CM | POA: Diagnosis not present

## 2015-08-04 DIAGNOSIS — M6281 Muscle weakness (generalized): Secondary | ICD-10-CM | POA: Diagnosis not present

## 2015-08-04 DIAGNOSIS — S72141D Displaced intertrochanteric fracture of right femur, subsequent encounter for closed fracture with routine healing: Secondary | ICD-10-CM | POA: Diagnosis not present

## 2015-08-07 DIAGNOSIS — M6281 Muscle weakness (generalized): Secondary | ICD-10-CM | POA: Diagnosis not present

## 2015-08-07 DIAGNOSIS — S72141D Displaced intertrochanteric fracture of right femur, subsequent encounter for closed fracture with routine healing: Secondary | ICD-10-CM | POA: Diagnosis not present

## 2015-08-08 DIAGNOSIS — R6889 Other general symptoms and signs: Secondary | ICD-10-CM | POA: Diagnosis not present

## 2015-08-08 DIAGNOSIS — E870 Hyperosmolality and hypernatremia: Secondary | ICD-10-CM | POA: Diagnosis not present

## 2015-08-08 DIAGNOSIS — M6281 Muscle weakness (generalized): Secondary | ICD-10-CM | POA: Diagnosis not present

## 2015-08-08 DIAGNOSIS — S72141D Displaced intertrochanteric fracture of right femur, subsequent encounter for closed fracture with routine healing: Secondary | ICD-10-CM | POA: Diagnosis not present

## 2015-08-08 DIAGNOSIS — N390 Urinary tract infection, site not specified: Secondary | ICD-10-CM | POA: Diagnosis not present

## 2015-08-08 DIAGNOSIS — D649 Anemia, unspecified: Secondary | ICD-10-CM | POA: Diagnosis not present

## 2015-08-08 DIAGNOSIS — D72829 Elevated white blood cell count, unspecified: Secondary | ICD-10-CM | POA: Diagnosis not present

## 2015-08-08 DIAGNOSIS — S9032XA Contusion of left foot, initial encounter: Secondary | ICD-10-CM | POA: Diagnosis not present

## 2015-08-08 DIAGNOSIS — R109 Unspecified abdominal pain: Secondary | ICD-10-CM | POA: Diagnosis not present

## 2015-08-09 DIAGNOSIS — R6889 Other general symptoms and signs: Secondary | ICD-10-CM | POA: Diagnosis not present

## 2015-08-09 DIAGNOSIS — M6281 Muscle weakness (generalized): Secondary | ICD-10-CM | POA: Diagnosis not present

## 2015-08-09 DIAGNOSIS — S72141D Displaced intertrochanteric fracture of right femur, subsequent encounter for closed fracture with routine healing: Secondary | ICD-10-CM | POA: Diagnosis not present

## 2015-08-09 DIAGNOSIS — N390 Urinary tract infection, site not specified: Secondary | ICD-10-CM | POA: Diagnosis not present

## 2015-08-09 DIAGNOSIS — R109 Unspecified abdominal pain: Secondary | ICD-10-CM | POA: Diagnosis not present

## 2015-08-09 DIAGNOSIS — R630 Anorexia: Secondary | ICD-10-CM | POA: Diagnosis not present

## 2015-08-09 DIAGNOSIS — E870 Hyperosmolality and hypernatremia: Secondary | ICD-10-CM | POA: Diagnosis not present

## 2015-08-09 DIAGNOSIS — L89610 Pressure ulcer of right heel, unstageable: Secondary | ICD-10-CM | POA: Diagnosis not present

## 2015-08-10 DIAGNOSIS — M6281 Muscle weakness (generalized): Secondary | ICD-10-CM | POA: Diagnosis not present

## 2015-08-10 DIAGNOSIS — N390 Urinary tract infection, site not specified: Secondary | ICD-10-CM | POA: Diagnosis not present

## 2015-08-10 DIAGNOSIS — N183 Chronic kidney disease, stage 3 (moderate): Secondary | ICD-10-CM | POA: Diagnosis not present

## 2015-08-10 DIAGNOSIS — S72141D Displaced intertrochanteric fracture of right femur, subsequent encounter for closed fracture with routine healing: Secondary | ICD-10-CM | POA: Diagnosis not present

## 2015-08-10 DIAGNOSIS — E870 Hyperosmolality and hypernatremia: Secondary | ICD-10-CM | POA: Diagnosis not present

## 2015-08-10 DIAGNOSIS — D72829 Elevated white blood cell count, unspecified: Secondary | ICD-10-CM | POA: Diagnosis not present

## 2015-08-10 DIAGNOSIS — R6889 Other general symptoms and signs: Secondary | ICD-10-CM | POA: Diagnosis not present

## 2015-08-11 DIAGNOSIS — R739 Hyperglycemia, unspecified: Secondary | ICD-10-CM | POA: Diagnosis not present

## 2015-08-11 DIAGNOSIS — E870 Hyperosmolality and hypernatremia: Secondary | ICD-10-CM | POA: Diagnosis not present

## 2015-08-11 DIAGNOSIS — R895 Abnormal microbiological findings in specimens from other organs, systems and tissues: Secondary | ICD-10-CM | POA: Diagnosis not present

## 2015-08-11 DIAGNOSIS — R6889 Other general symptoms and signs: Secondary | ICD-10-CM | POA: Diagnosis not present

## 2015-08-11 DIAGNOSIS — S72141D Displaced intertrochanteric fracture of right femur, subsequent encounter for closed fracture with routine healing: Secondary | ICD-10-CM | POA: Diagnosis not present

## 2015-08-11 DIAGNOSIS — N390 Urinary tract infection, site not specified: Secondary | ICD-10-CM | POA: Diagnosis not present

## 2015-08-11 DIAGNOSIS — M6281 Muscle weakness (generalized): Secondary | ICD-10-CM | POA: Diagnosis not present

## 2015-08-11 DIAGNOSIS — D72829 Elevated white blood cell count, unspecified: Secondary | ICD-10-CM | POA: Diagnosis not present

## 2015-08-12 DIAGNOSIS — S72141D Displaced intertrochanteric fracture of right femur, subsequent encounter for closed fracture with routine healing: Secondary | ICD-10-CM | POA: Diagnosis not present

## 2015-08-12 DIAGNOSIS — R895 Abnormal microbiological findings in specimens from other organs, systems and tissues: Secondary | ICD-10-CM | POA: Diagnosis not present

## 2015-08-12 DIAGNOSIS — M6281 Muscle weakness (generalized): Secondary | ICD-10-CM | POA: Diagnosis not present

## 2015-08-12 DIAGNOSIS — E870 Hyperosmolality and hypernatremia: Secondary | ICD-10-CM | POA: Diagnosis not present

## 2015-08-12 DIAGNOSIS — R6889 Other general symptoms and signs: Secondary | ICD-10-CM | POA: Diagnosis not present

## 2015-08-12 DIAGNOSIS — D72829 Elevated white blood cell count, unspecified: Secondary | ICD-10-CM | POA: Diagnosis not present

## 2015-08-12 DIAGNOSIS — N390 Urinary tract infection, site not specified: Secondary | ICD-10-CM | POA: Diagnosis not present

## 2015-08-14 DIAGNOSIS — R895 Abnormal microbiological findings in specimens from other organs, systems and tissues: Secondary | ICD-10-CM | POA: Diagnosis not present

## 2015-08-14 DIAGNOSIS — S72141D Displaced intertrochanteric fracture of right femur, subsequent encounter for closed fracture with routine healing: Secondary | ICD-10-CM | POA: Diagnosis not present

## 2015-08-14 DIAGNOSIS — R6889 Other general symptoms and signs: Secondary | ICD-10-CM | POA: Diagnosis not present

## 2015-08-14 DIAGNOSIS — E119 Type 2 diabetes mellitus without complications: Secondary | ICD-10-CM | POA: Diagnosis not present

## 2015-08-14 DIAGNOSIS — M6281 Muscle weakness (generalized): Secondary | ICD-10-CM | POA: Diagnosis not present

## 2015-08-14 DIAGNOSIS — A047 Enterocolitis due to Clostridium difficile: Secondary | ICD-10-CM | POA: Diagnosis not present

## 2015-08-14 DIAGNOSIS — N183 Chronic kidney disease, stage 3 (moderate): Secondary | ICD-10-CM | POA: Diagnosis not present

## 2015-08-15 DIAGNOSIS — M6281 Muscle weakness (generalized): Secondary | ICD-10-CM | POA: Diagnosis not present

## 2015-08-15 DIAGNOSIS — S72141D Displaced intertrochanteric fracture of right femur, subsequent encounter for closed fracture with routine healing: Secondary | ICD-10-CM | POA: Diagnosis not present

## 2015-08-16 DIAGNOSIS — D72829 Elevated white blood cell count, unspecified: Secondary | ICD-10-CM | POA: Diagnosis not present

## 2015-08-16 DIAGNOSIS — L89610 Pressure ulcer of right heel, unstageable: Secondary | ICD-10-CM | POA: Diagnosis not present

## 2015-08-16 DIAGNOSIS — S72141D Displaced intertrochanteric fracture of right femur, subsequent encounter for closed fracture with routine healing: Secondary | ICD-10-CM | POA: Diagnosis not present

## 2015-08-16 DIAGNOSIS — R197 Diarrhea, unspecified: Secondary | ICD-10-CM | POA: Diagnosis not present

## 2015-08-16 DIAGNOSIS — R102 Pelvic and perineal pain: Secondary | ICD-10-CM | POA: Diagnosis not present

## 2015-08-16 DIAGNOSIS — R6889 Other general symptoms and signs: Secondary | ICD-10-CM | POA: Diagnosis not present

## 2015-08-16 DIAGNOSIS — L8990 Pressure ulcer of unspecified site, unspecified stage: Secondary | ICD-10-CM | POA: Diagnosis not present

## 2015-08-16 DIAGNOSIS — D649 Anemia, unspecified: Secondary | ICD-10-CM | POA: Diagnosis not present

## 2015-08-16 DIAGNOSIS — M6281 Muscle weakness (generalized): Secondary | ICD-10-CM | POA: Diagnosis not present

## 2015-08-17 DIAGNOSIS — R197 Diarrhea, unspecified: Secondary | ICD-10-CM | POA: Diagnosis not present

## 2015-08-17 DIAGNOSIS — M6281 Muscle weakness (generalized): Secondary | ICD-10-CM | POA: Diagnosis not present

## 2015-08-17 DIAGNOSIS — E119 Type 2 diabetes mellitus without complications: Secondary | ICD-10-CM | POA: Diagnosis not present

## 2015-08-17 DIAGNOSIS — M25551 Pain in right hip: Secondary | ICD-10-CM | POA: Diagnosis not present

## 2015-08-17 DIAGNOSIS — L8990 Pressure ulcer of unspecified site, unspecified stage: Secondary | ICD-10-CM | POA: Diagnosis not present

## 2015-08-17 DIAGNOSIS — S72141D Displaced intertrochanteric fracture of right femur, subsequent encounter for closed fracture with routine healing: Secondary | ICD-10-CM | POA: Diagnosis not present

## 2015-08-17 DIAGNOSIS — R102 Pelvic and perineal pain: Secondary | ICD-10-CM | POA: Diagnosis not present

## 2015-08-17 DIAGNOSIS — R770 Abnormality of albumin: Secondary | ICD-10-CM | POA: Diagnosis not present

## 2015-08-17 DIAGNOSIS — R6889 Other general symptoms and signs: Secondary | ICD-10-CM | POA: Diagnosis not present

## 2015-08-18 DIAGNOSIS — S72141D Displaced intertrochanteric fracture of right femur, subsequent encounter for closed fracture with routine healing: Secondary | ICD-10-CM | POA: Diagnosis not present

## 2015-08-18 DIAGNOSIS — E119 Type 2 diabetes mellitus without complications: Secondary | ICD-10-CM | POA: Diagnosis not present

## 2015-08-18 DIAGNOSIS — R6889 Other general symptoms and signs: Secondary | ICD-10-CM | POA: Diagnosis not present

## 2015-08-18 DIAGNOSIS — M6281 Muscle weakness (generalized): Secondary | ICD-10-CM | POA: Diagnosis not present

## 2015-08-20 DIAGNOSIS — R102 Pelvic and perineal pain: Secondary | ICD-10-CM | POA: Diagnosis not present

## 2015-08-20 DIAGNOSIS — D649 Anemia, unspecified: Secondary | ICD-10-CM | POA: Diagnosis not present

## 2015-08-20 DIAGNOSIS — N390 Urinary tract infection, site not specified: Secondary | ICD-10-CM | POA: Diagnosis not present

## 2015-08-20 DIAGNOSIS — N179 Acute kidney failure, unspecified: Secondary | ICD-10-CM | POA: Diagnosis not present

## 2015-08-20 DIAGNOSIS — L8990 Pressure ulcer of unspecified site, unspecified stage: Secondary | ICD-10-CM | POA: Diagnosis not present

## 2015-08-20 DIAGNOSIS — E119 Type 2 diabetes mellitus without complications: Secondary | ICD-10-CM | POA: Diagnosis not present

## 2015-08-20 DIAGNOSIS — D72829 Elevated white blood cell count, unspecified: Secondary | ICD-10-CM | POA: Diagnosis not present

## 2015-08-21 DIAGNOSIS — S72141D Displaced intertrochanteric fracture of right femur, subsequent encounter for closed fracture with routine healing: Secondary | ICD-10-CM | POA: Diagnosis not present

## 2015-08-21 DIAGNOSIS — M6281 Muscle weakness (generalized): Secondary | ICD-10-CM | POA: Diagnosis not present

## 2015-08-21 DIAGNOSIS — R6889 Other general symptoms and signs: Secondary | ICD-10-CM | POA: Diagnosis not present

## 2015-08-21 DIAGNOSIS — R197 Diarrhea, unspecified: Secondary | ICD-10-CM | POA: Diagnosis not present

## 2015-08-21 DIAGNOSIS — A047 Enterocolitis due to Clostridium difficile: Secondary | ICD-10-CM | POA: Diagnosis not present

## 2015-08-22 DIAGNOSIS — N183 Chronic kidney disease, stage 3 (moderate): Secondary | ICD-10-CM | POA: Diagnosis not present

## 2015-08-22 DIAGNOSIS — R6889 Other general symptoms and signs: Secondary | ICD-10-CM | POA: Diagnosis not present

## 2015-08-22 DIAGNOSIS — D72829 Elevated white blood cell count, unspecified: Secondary | ICD-10-CM | POA: Diagnosis not present

## 2015-08-22 DIAGNOSIS — M6281 Muscle weakness (generalized): Secondary | ICD-10-CM | POA: Diagnosis not present

## 2015-08-22 DIAGNOSIS — A047 Enterocolitis due to Clostridium difficile: Secondary | ICD-10-CM | POA: Diagnosis not present

## 2015-08-22 DIAGNOSIS — R627 Adult failure to thrive: Secondary | ICD-10-CM | POA: Diagnosis not present

## 2015-08-22 DIAGNOSIS — S72141D Displaced intertrochanteric fracture of right femur, subsequent encounter for closed fracture with routine healing: Secondary | ICD-10-CM | POA: Diagnosis not present

## 2015-08-22 DIAGNOSIS — L8990 Pressure ulcer of unspecified site, unspecified stage: Secondary | ICD-10-CM | POA: Diagnosis not present

## 2015-08-23 DIAGNOSIS — L89610 Pressure ulcer of right heel, unstageable: Secondary | ICD-10-CM | POA: Diagnosis not present

## 2015-08-23 DIAGNOSIS — M6281 Muscle weakness (generalized): Secondary | ICD-10-CM | POA: Diagnosis not present

## 2015-08-23 DIAGNOSIS — S72141D Displaced intertrochanteric fracture of right femur, subsequent encounter for closed fracture with routine healing: Secondary | ICD-10-CM | POA: Diagnosis not present

## 2015-08-24 DIAGNOSIS — D72829 Elevated white blood cell count, unspecified: Secondary | ICD-10-CM | POA: Diagnosis not present

## 2015-08-24 DIAGNOSIS — S72141D Displaced intertrochanteric fracture of right femur, subsequent encounter for closed fracture with routine healing: Secondary | ICD-10-CM | POA: Diagnosis not present

## 2015-08-24 DIAGNOSIS — R770 Abnormality of albumin: Secondary | ICD-10-CM | POA: Diagnosis not present

## 2015-08-24 DIAGNOSIS — A047 Enterocolitis due to Clostridium difficile: Secondary | ICD-10-CM | POA: Diagnosis not present

## 2015-08-24 DIAGNOSIS — R6889 Other general symptoms and signs: Secondary | ICD-10-CM | POA: Diagnosis not present

## 2015-08-24 DIAGNOSIS — L8990 Pressure ulcer of unspecified site, unspecified stage: Secondary | ICD-10-CM | POA: Diagnosis not present

## 2015-08-24 DIAGNOSIS — M6281 Muscle weakness (generalized): Secondary | ICD-10-CM | POA: Diagnosis not present

## 2015-08-24 DIAGNOSIS — E119 Type 2 diabetes mellitus without complications: Secondary | ICD-10-CM | POA: Diagnosis not present

## 2015-08-24 DIAGNOSIS — R627 Adult failure to thrive: Secondary | ICD-10-CM | POA: Diagnosis not present

## 2015-08-25 DIAGNOSIS — R6889 Other general symptoms and signs: Secondary | ICD-10-CM | POA: Diagnosis not present

## 2015-08-25 DIAGNOSIS — M6281 Muscle weakness (generalized): Secondary | ICD-10-CM | POA: Diagnosis not present

## 2015-08-25 DIAGNOSIS — S72141D Displaced intertrochanteric fracture of right femur, subsequent encounter for closed fracture with routine healing: Secondary | ICD-10-CM | POA: Diagnosis not present

## 2015-08-26 DIAGNOSIS — E119 Type 2 diabetes mellitus without complications: Secondary | ICD-10-CM | POA: Diagnosis not present

## 2015-08-26 DIAGNOSIS — R6889 Other general symptoms and signs: Secondary | ICD-10-CM | POA: Diagnosis not present

## 2015-08-26 DIAGNOSIS — A047 Enterocolitis due to Clostridium difficile: Secondary | ICD-10-CM | POA: Diagnosis not present

## 2015-08-26 DIAGNOSIS — R627 Adult failure to thrive: Secondary | ICD-10-CM | POA: Diagnosis not present

## 2015-08-26 DIAGNOSIS — L8990 Pressure ulcer of unspecified site, unspecified stage: Secondary | ICD-10-CM | POA: Diagnosis not present

## 2015-08-26 DIAGNOSIS — N183 Chronic kidney disease, stage 3 (moderate): Secondary | ICD-10-CM | POA: Diagnosis not present

## 2015-08-26 DIAGNOSIS — D72829 Elevated white blood cell count, unspecified: Secondary | ICD-10-CM | POA: Diagnosis not present

## 2015-08-28 DIAGNOSIS — D72829 Elevated white blood cell count, unspecified: Secondary | ICD-10-CM | POA: Diagnosis not present

## 2015-08-28 DIAGNOSIS — M6281 Muscle weakness (generalized): Secondary | ICD-10-CM | POA: Diagnosis not present

## 2015-08-28 DIAGNOSIS — A047 Enterocolitis due to Clostridium difficile: Secondary | ICD-10-CM | POA: Diagnosis not present

## 2015-08-28 DIAGNOSIS — S72141D Displaced intertrochanteric fracture of right femur, subsequent encounter for closed fracture with routine healing: Secondary | ICD-10-CM | POA: Diagnosis not present

## 2015-08-28 DIAGNOSIS — N183 Chronic kidney disease, stage 3 (moderate): Secondary | ICD-10-CM | POA: Diagnosis not present

## 2015-08-28 DIAGNOSIS — N390 Urinary tract infection, site not specified: Secondary | ICD-10-CM | POA: Diagnosis not present

## 2015-08-28 DIAGNOSIS — R6889 Other general symptoms and signs: Secondary | ICD-10-CM | POA: Diagnosis not present

## 2015-08-29 DIAGNOSIS — S72141D Displaced intertrochanteric fracture of right femur, subsequent encounter for closed fracture with routine healing: Secondary | ICD-10-CM | POA: Diagnosis not present

## 2015-08-29 DIAGNOSIS — M6281 Muscle weakness (generalized): Secondary | ICD-10-CM | POA: Diagnosis not present

## 2015-08-30 DIAGNOSIS — S72141D Displaced intertrochanteric fracture of right femur, subsequent encounter for closed fracture with routine healing: Secondary | ICD-10-CM | POA: Diagnosis not present

## 2015-08-30 DIAGNOSIS — M6281 Muscle weakness (generalized): Secondary | ICD-10-CM | POA: Diagnosis not present

## 2015-08-31 DIAGNOSIS — S72141D Displaced intertrochanteric fracture of right femur, subsequent encounter for closed fracture with routine healing: Secondary | ICD-10-CM | POA: Diagnosis not present

## 2015-08-31 DIAGNOSIS — M6281 Muscle weakness (generalized): Secondary | ICD-10-CM | POA: Diagnosis not present

## 2015-09-01 DIAGNOSIS — M6281 Muscle weakness (generalized): Secondary | ICD-10-CM | POA: Diagnosis not present

## 2015-09-01 DIAGNOSIS — D72829 Elevated white blood cell count, unspecified: Secondary | ICD-10-CM | POA: Diagnosis not present

## 2015-09-01 DIAGNOSIS — A047 Enterocolitis due to Clostridium difficile: Secondary | ICD-10-CM | POA: Diagnosis not present

## 2015-09-01 DIAGNOSIS — E119 Type 2 diabetes mellitus without complications: Secondary | ICD-10-CM | POA: Diagnosis not present

## 2015-09-01 DIAGNOSIS — R6889 Other general symptoms and signs: Secondary | ICD-10-CM | POA: Diagnosis not present

## 2015-09-01 DIAGNOSIS — N183 Chronic kidney disease, stage 3 (moderate): Secondary | ICD-10-CM | POA: Diagnosis not present

## 2015-09-01 DIAGNOSIS — S72141D Displaced intertrochanteric fracture of right femur, subsequent encounter for closed fracture with routine healing: Secondary | ICD-10-CM | POA: Diagnosis not present

## 2015-09-01 DIAGNOSIS — N390 Urinary tract infection, site not specified: Secondary | ICD-10-CM | POA: Diagnosis not present

## 2015-09-03 DIAGNOSIS — N183 Chronic kidney disease, stage 3 (moderate): Secondary | ICD-10-CM | POA: Diagnosis not present

## 2015-09-03 DIAGNOSIS — N390 Urinary tract infection, site not specified: Secondary | ICD-10-CM | POA: Diagnosis not present

## 2015-09-03 DIAGNOSIS — N179 Acute kidney failure, unspecified: Secondary | ICD-10-CM | POA: Diagnosis not present

## 2015-09-04 DIAGNOSIS — M6281 Muscle weakness (generalized): Secondary | ICD-10-CM | POA: Diagnosis not present

## 2015-09-04 DIAGNOSIS — N179 Acute kidney failure, unspecified: Secondary | ICD-10-CM | POA: Diagnosis not present

## 2015-09-04 DIAGNOSIS — S72141D Displaced intertrochanteric fracture of right femur, subsequent encounter for closed fracture with routine healing: Secondary | ICD-10-CM | POA: Diagnosis not present

## 2015-09-05 DIAGNOSIS — N390 Urinary tract infection, site not specified: Secondary | ICD-10-CM | POA: Diagnosis not present

## 2015-09-05 DIAGNOSIS — D72829 Elevated white blood cell count, unspecified: Secondary | ICD-10-CM | POA: Diagnosis not present

## 2015-09-05 DIAGNOSIS — R630 Anorexia: Secondary | ICD-10-CM | POA: Diagnosis not present

## 2015-09-05 DIAGNOSIS — S72141D Displaced intertrochanteric fracture of right femur, subsequent encounter for closed fracture with routine healing: Secondary | ICD-10-CM | POA: Diagnosis not present

## 2015-09-05 DIAGNOSIS — R6889 Other general symptoms and signs: Secondary | ICD-10-CM | POA: Diagnosis not present

## 2015-09-05 DIAGNOSIS — R627 Adult failure to thrive: Secondary | ICD-10-CM | POA: Diagnosis not present

## 2015-09-05 DIAGNOSIS — A047 Enterocolitis due to Clostridium difficile: Secondary | ICD-10-CM | POA: Diagnosis not present

## 2015-09-05 DIAGNOSIS — M6281 Muscle weakness (generalized): Secondary | ICD-10-CM | POA: Diagnosis not present

## 2015-09-06 DIAGNOSIS — S72141D Displaced intertrochanteric fracture of right femur, subsequent encounter for closed fracture with routine healing: Secondary | ICD-10-CM | POA: Diagnosis not present

## 2015-09-06 DIAGNOSIS — L89610 Pressure ulcer of right heel, unstageable: Secondary | ICD-10-CM | POA: Diagnosis not present

## 2015-09-06 DIAGNOSIS — M6281 Muscle weakness (generalized): Secondary | ICD-10-CM | POA: Diagnosis not present

## 2015-09-07 DIAGNOSIS — M6281 Muscle weakness (generalized): Secondary | ICD-10-CM | POA: Diagnosis not present

## 2015-09-07 DIAGNOSIS — R6889 Other general symptoms and signs: Secondary | ICD-10-CM | POA: Diagnosis not present

## 2015-09-07 DIAGNOSIS — E119 Type 2 diabetes mellitus without complications: Secondary | ICD-10-CM | POA: Diagnosis not present

## 2015-09-07 DIAGNOSIS — S72141D Displaced intertrochanteric fracture of right femur, subsequent encounter for closed fracture with routine healing: Secondary | ICD-10-CM | POA: Diagnosis not present

## 2015-09-08 DIAGNOSIS — S72141D Displaced intertrochanteric fracture of right femur, subsequent encounter for closed fracture with routine healing: Secondary | ICD-10-CM | POA: Diagnosis not present

## 2015-09-08 DIAGNOSIS — R6889 Other general symptoms and signs: Secondary | ICD-10-CM | POA: Diagnosis not present

## 2015-09-08 DIAGNOSIS — M6281 Muscle weakness (generalized): Secondary | ICD-10-CM | POA: Diagnosis not present

## 2015-09-11 DIAGNOSIS — R6889 Other general symptoms and signs: Secondary | ICD-10-CM | POA: Diagnosis not present

## 2015-09-11 DIAGNOSIS — M6281 Muscle weakness (generalized): Secondary | ICD-10-CM | POA: Diagnosis not present

## 2015-09-11 DIAGNOSIS — S72141D Displaced intertrochanteric fracture of right femur, subsequent encounter for closed fracture with routine healing: Secondary | ICD-10-CM | POA: Diagnosis not present

## 2015-09-12 DIAGNOSIS — M6281 Muscle weakness (generalized): Secondary | ICD-10-CM | POA: Diagnosis not present

## 2015-09-12 DIAGNOSIS — S72141D Displaced intertrochanteric fracture of right femur, subsequent encounter for closed fracture with routine healing: Secondary | ICD-10-CM | POA: Diagnosis not present

## 2015-09-13 DIAGNOSIS — L89622 Pressure ulcer of left heel, stage 2: Secondary | ICD-10-CM | POA: Diagnosis not present

## 2015-09-13 DIAGNOSIS — S72141D Displaced intertrochanteric fracture of right femur, subsequent encounter for closed fracture with routine healing: Secondary | ICD-10-CM | POA: Diagnosis not present

## 2015-09-13 DIAGNOSIS — M6281 Muscle weakness (generalized): Secondary | ICD-10-CM | POA: Diagnosis not present

## 2015-09-13 DIAGNOSIS — N179 Acute kidney failure, unspecified: Secondary | ICD-10-CM | POA: Diagnosis not present

## 2015-09-13 DIAGNOSIS — D72829 Elevated white blood cell count, unspecified: Secondary | ICD-10-CM | POA: Diagnosis not present

## 2015-09-14 DIAGNOSIS — M6281 Muscle weakness (generalized): Secondary | ICD-10-CM | POA: Diagnosis not present

## 2015-09-14 DIAGNOSIS — S72141D Displaced intertrochanteric fracture of right femur, subsequent encounter for closed fracture with routine healing: Secondary | ICD-10-CM | POA: Diagnosis not present

## 2015-09-15 DIAGNOSIS — S72141D Displaced intertrochanteric fracture of right femur, subsequent encounter for closed fracture with routine healing: Secondary | ICD-10-CM | POA: Diagnosis not present

## 2015-09-15 DIAGNOSIS — M6281 Muscle weakness (generalized): Secondary | ICD-10-CM | POA: Diagnosis not present

## 2015-09-18 DIAGNOSIS — M6281 Muscle weakness (generalized): Secondary | ICD-10-CM | POA: Diagnosis not present

## 2015-09-18 DIAGNOSIS — S72141D Displaced intertrochanteric fracture of right femur, subsequent encounter for closed fracture with routine healing: Secondary | ICD-10-CM | POA: Diagnosis not present

## 2015-09-19 DIAGNOSIS — M6281 Muscle weakness (generalized): Secondary | ICD-10-CM | POA: Diagnosis not present

## 2015-09-19 DIAGNOSIS — S72141D Displaced intertrochanteric fracture of right femur, subsequent encounter for closed fracture with routine healing: Secondary | ICD-10-CM | POA: Diagnosis not present

## 2015-09-20 DIAGNOSIS — L89623 Pressure ulcer of left heel, stage 3: Secondary | ICD-10-CM | POA: Diagnosis not present

## 2015-09-20 DIAGNOSIS — R630 Anorexia: Secondary | ICD-10-CM | POA: Diagnosis not present

## 2015-09-20 DIAGNOSIS — N179 Acute kidney failure, unspecified: Secondary | ICD-10-CM | POA: Diagnosis not present

## 2015-09-20 DIAGNOSIS — D72829 Elevated white blood cell count, unspecified: Secondary | ICD-10-CM | POA: Diagnosis not present

## 2015-09-20 DIAGNOSIS — E46 Unspecified protein-calorie malnutrition: Secondary | ICD-10-CM | POA: Diagnosis not present

## 2015-09-20 DIAGNOSIS — S72141D Displaced intertrochanteric fracture of right femur, subsequent encounter for closed fracture with routine healing: Secondary | ICD-10-CM | POA: Diagnosis not present

## 2015-09-20 DIAGNOSIS — M6281 Muscle weakness (generalized): Secondary | ICD-10-CM | POA: Diagnosis not present

## 2015-09-20 DIAGNOSIS — L8990 Pressure ulcer of unspecified site, unspecified stage: Secondary | ICD-10-CM | POA: Diagnosis not present

## 2015-09-20 DIAGNOSIS — R627 Adult failure to thrive: Secondary | ICD-10-CM | POA: Diagnosis not present

## 2015-09-21 DIAGNOSIS — S72141D Displaced intertrochanteric fracture of right femur, subsequent encounter for closed fracture with routine healing: Secondary | ICD-10-CM | POA: Diagnosis not present

## 2015-09-21 DIAGNOSIS — M6281 Muscle weakness (generalized): Secondary | ICD-10-CM | POA: Diagnosis not present

## 2015-09-22 DIAGNOSIS — S72141D Displaced intertrochanteric fracture of right femur, subsequent encounter for closed fracture with routine healing: Secondary | ICD-10-CM | POA: Diagnosis not present

## 2015-09-22 DIAGNOSIS — M6281 Muscle weakness (generalized): Secondary | ICD-10-CM | POA: Diagnosis not present

## 2015-09-25 DIAGNOSIS — E119 Type 2 diabetes mellitus without complications: Secondary | ICD-10-CM | POA: Diagnosis not present

## 2015-09-25 DIAGNOSIS — S72141D Displaced intertrochanteric fracture of right femur, subsequent encounter for closed fracture with routine healing: Secondary | ICD-10-CM | POA: Diagnosis not present

## 2015-09-25 DIAGNOSIS — M6281 Muscle weakness (generalized): Secondary | ICD-10-CM | POA: Diagnosis not present

## 2015-09-25 DIAGNOSIS — N179 Acute kidney failure, unspecified: Secondary | ICD-10-CM | POA: Diagnosis not present

## 2015-09-25 DIAGNOSIS — R6889 Other general symptoms and signs: Secondary | ICD-10-CM | POA: Diagnosis not present

## 2015-09-26 DIAGNOSIS — S72141D Displaced intertrochanteric fracture of right femur, subsequent encounter for closed fracture with routine healing: Secondary | ICD-10-CM | POA: Diagnosis not present

## 2015-09-26 DIAGNOSIS — M6281 Muscle weakness (generalized): Secondary | ICD-10-CM | POA: Diagnosis not present

## 2015-09-27 DIAGNOSIS — L89613 Pressure ulcer of right heel, stage 3: Secondary | ICD-10-CM | POA: Diagnosis not present

## 2015-09-27 DIAGNOSIS — M6281 Muscle weakness (generalized): Secondary | ICD-10-CM | POA: Diagnosis not present

## 2015-09-27 DIAGNOSIS — N179 Acute kidney failure, unspecified: Secondary | ICD-10-CM | POA: Diagnosis not present

## 2015-09-27 DIAGNOSIS — D72829 Elevated white blood cell count, unspecified: Secondary | ICD-10-CM | POA: Diagnosis not present

## 2015-09-27 DIAGNOSIS — S72141D Displaced intertrochanteric fracture of right femur, subsequent encounter for closed fracture with routine healing: Secondary | ICD-10-CM | POA: Diagnosis not present

## 2015-09-28 DIAGNOSIS — M6281 Muscle weakness (generalized): Secondary | ICD-10-CM | POA: Diagnosis not present

## 2015-09-28 DIAGNOSIS — S72141D Displaced intertrochanteric fracture of right femur, subsequent encounter for closed fracture with routine healing: Secondary | ICD-10-CM | POA: Diagnosis not present

## 2015-09-29 DIAGNOSIS — M6281 Muscle weakness (generalized): Secondary | ICD-10-CM | POA: Diagnosis not present

## 2015-09-29 DIAGNOSIS — S72141D Displaced intertrochanteric fracture of right femur, subsequent encounter for closed fracture with routine healing: Secondary | ICD-10-CM | POA: Diagnosis not present

## 2015-10-02 DIAGNOSIS — S72141D Displaced intertrochanteric fracture of right femur, subsequent encounter for closed fracture with routine healing: Secondary | ICD-10-CM | POA: Diagnosis not present

## 2015-10-02 DIAGNOSIS — M6281 Muscle weakness (generalized): Secondary | ICD-10-CM | POA: Diagnosis not present

## 2015-10-03 DIAGNOSIS — M6281 Muscle weakness (generalized): Secondary | ICD-10-CM | POA: Diagnosis not present

## 2015-10-03 DIAGNOSIS — S72141D Displaced intertrochanteric fracture of right femur, subsequent encounter for closed fracture with routine healing: Secondary | ICD-10-CM | POA: Diagnosis not present

## 2015-10-04 DIAGNOSIS — D72829 Elevated white blood cell count, unspecified: Secondary | ICD-10-CM | POA: Diagnosis not present

## 2015-10-04 DIAGNOSIS — S72141D Displaced intertrochanteric fracture of right femur, subsequent encounter for closed fracture with routine healing: Secondary | ICD-10-CM | POA: Diagnosis not present

## 2015-10-04 DIAGNOSIS — N179 Acute kidney failure, unspecified: Secondary | ICD-10-CM | POA: Diagnosis not present

## 2015-10-04 DIAGNOSIS — M6281 Muscle weakness (generalized): Secondary | ICD-10-CM | POA: Diagnosis not present

## 2015-10-04 DIAGNOSIS — L89623 Pressure ulcer of left heel, stage 3: Secondary | ICD-10-CM | POA: Diagnosis not present

## 2015-10-05 DIAGNOSIS — S72141D Displaced intertrochanteric fracture of right femur, subsequent encounter for closed fracture with routine healing: Secondary | ICD-10-CM | POA: Diagnosis not present

## 2015-10-05 DIAGNOSIS — M6281 Muscle weakness (generalized): Secondary | ICD-10-CM | POA: Diagnosis not present

## 2015-10-06 DIAGNOSIS — M6281 Muscle weakness (generalized): Secondary | ICD-10-CM | POA: Diagnosis not present

## 2015-10-06 DIAGNOSIS — S72141D Displaced intertrochanteric fracture of right femur, subsequent encounter for closed fracture with routine healing: Secondary | ICD-10-CM | POA: Diagnosis not present

## 2015-10-09 DIAGNOSIS — S72141D Displaced intertrochanteric fracture of right femur, subsequent encounter for closed fracture with routine healing: Secondary | ICD-10-CM | POA: Diagnosis not present

## 2015-10-09 DIAGNOSIS — M6281 Muscle weakness (generalized): Secondary | ICD-10-CM | POA: Diagnosis not present

## 2015-10-10 DIAGNOSIS — S72141D Displaced intertrochanteric fracture of right femur, subsequent encounter for closed fracture with routine healing: Secondary | ICD-10-CM | POA: Diagnosis not present

## 2015-10-10 DIAGNOSIS — M6281 Muscle weakness (generalized): Secondary | ICD-10-CM | POA: Diagnosis not present

## 2015-10-11 DIAGNOSIS — M6281 Muscle weakness (generalized): Secondary | ICD-10-CM | POA: Diagnosis not present

## 2015-10-11 DIAGNOSIS — S72141D Displaced intertrochanteric fracture of right femur, subsequent encounter for closed fracture with routine healing: Secondary | ICD-10-CM | POA: Diagnosis not present

## 2015-10-11 DIAGNOSIS — L89623 Pressure ulcer of left heel, stage 3: Secondary | ICD-10-CM | POA: Diagnosis not present

## 2015-10-12 DIAGNOSIS — S72141D Displaced intertrochanteric fracture of right femur, subsequent encounter for closed fracture with routine healing: Secondary | ICD-10-CM | POA: Diagnosis not present

## 2015-10-12 DIAGNOSIS — M6281 Muscle weakness (generalized): Secondary | ICD-10-CM | POA: Diagnosis not present

## 2015-10-13 DIAGNOSIS — S72141D Displaced intertrochanteric fracture of right femur, subsequent encounter for closed fracture with routine healing: Secondary | ICD-10-CM | POA: Diagnosis not present

## 2015-10-13 DIAGNOSIS — M6281 Muscle weakness (generalized): Secondary | ICD-10-CM | POA: Diagnosis not present

## 2015-10-16 DIAGNOSIS — M6281 Muscle weakness (generalized): Secondary | ICD-10-CM | POA: Diagnosis not present

## 2015-10-16 DIAGNOSIS — S72141D Displaced intertrochanteric fracture of right femur, subsequent encounter for closed fracture with routine healing: Secondary | ICD-10-CM | POA: Diagnosis not present

## 2015-10-17 DIAGNOSIS — M6281 Muscle weakness (generalized): Secondary | ICD-10-CM | POA: Diagnosis not present

## 2015-10-17 DIAGNOSIS — S72141D Displaced intertrochanteric fracture of right femur, subsequent encounter for closed fracture with routine healing: Secondary | ICD-10-CM | POA: Diagnosis not present

## 2015-10-18 DIAGNOSIS — S72141D Displaced intertrochanteric fracture of right femur, subsequent encounter for closed fracture with routine healing: Secondary | ICD-10-CM | POA: Diagnosis not present

## 2015-10-18 DIAGNOSIS — L89623 Pressure ulcer of left heel, stage 3: Secondary | ICD-10-CM | POA: Diagnosis not present

## 2015-10-18 DIAGNOSIS — M6281 Muscle weakness (generalized): Secondary | ICD-10-CM | POA: Diagnosis not present

## 2015-10-19 DIAGNOSIS — S72141D Displaced intertrochanteric fracture of right femur, subsequent encounter for closed fracture with routine healing: Secondary | ICD-10-CM | POA: Diagnosis not present

## 2015-10-19 DIAGNOSIS — M6281 Muscle weakness (generalized): Secondary | ICD-10-CM | POA: Diagnosis not present

## 2015-10-20 DIAGNOSIS — M6281 Muscle weakness (generalized): Secondary | ICD-10-CM | POA: Diagnosis not present

## 2015-10-20 DIAGNOSIS — S72141D Displaced intertrochanteric fracture of right femur, subsequent encounter for closed fracture with routine healing: Secondary | ICD-10-CM | POA: Diagnosis not present

## 2015-10-23 DIAGNOSIS — M6281 Muscle weakness (generalized): Secondary | ICD-10-CM | POA: Diagnosis not present

## 2015-10-23 DIAGNOSIS — S72141D Displaced intertrochanteric fracture of right femur, subsequent encounter for closed fracture with routine healing: Secondary | ICD-10-CM | POA: Diagnosis not present

## 2015-10-24 DIAGNOSIS — S72141D Displaced intertrochanteric fracture of right femur, subsequent encounter for closed fracture with routine healing: Secondary | ICD-10-CM | POA: Diagnosis not present

## 2015-10-24 DIAGNOSIS — M6281 Muscle weakness (generalized): Secondary | ICD-10-CM | POA: Diagnosis not present

## 2015-10-25 DIAGNOSIS — S72141D Displaced intertrochanteric fracture of right femur, subsequent encounter for closed fracture with routine healing: Secondary | ICD-10-CM | POA: Diagnosis not present

## 2015-10-25 DIAGNOSIS — M6281 Muscle weakness (generalized): Secondary | ICD-10-CM | POA: Diagnosis not present

## 2015-10-26 DIAGNOSIS — M6281 Muscle weakness (generalized): Secondary | ICD-10-CM | POA: Diagnosis not present

## 2015-10-26 DIAGNOSIS — S72141D Displaced intertrochanteric fracture of right femur, subsequent encounter for closed fracture with routine healing: Secondary | ICD-10-CM | POA: Diagnosis not present

## 2015-10-27 DIAGNOSIS — S72141D Displaced intertrochanteric fracture of right femur, subsequent encounter for closed fracture with routine healing: Secondary | ICD-10-CM | POA: Diagnosis not present

## 2015-10-27 DIAGNOSIS — M6281 Muscle weakness (generalized): Secondary | ICD-10-CM | POA: Diagnosis not present

## 2015-10-30 DIAGNOSIS — M6281 Muscle weakness (generalized): Secondary | ICD-10-CM | POA: Diagnosis not present

## 2015-10-30 DIAGNOSIS — S72141D Displaced intertrochanteric fracture of right femur, subsequent encounter for closed fracture with routine healing: Secondary | ICD-10-CM | POA: Diagnosis not present

## 2015-10-31 DIAGNOSIS — M6281 Muscle weakness (generalized): Secondary | ICD-10-CM | POA: Diagnosis not present

## 2015-10-31 DIAGNOSIS — S72141D Displaced intertrochanteric fracture of right femur, subsequent encounter for closed fracture with routine healing: Secondary | ICD-10-CM | POA: Diagnosis not present

## 2015-11-01 DIAGNOSIS — L89612 Pressure ulcer of right heel, stage 2: Secondary | ICD-10-CM | POA: Diagnosis not present

## 2015-11-01 DIAGNOSIS — S72141D Displaced intertrochanteric fracture of right femur, subsequent encounter for closed fracture with routine healing: Secondary | ICD-10-CM | POA: Diagnosis not present

## 2015-11-01 DIAGNOSIS — M6281 Muscle weakness (generalized): Secondary | ICD-10-CM | POA: Diagnosis not present

## 2015-11-02 DIAGNOSIS — S72141D Displaced intertrochanteric fracture of right femur, subsequent encounter for closed fracture with routine healing: Secondary | ICD-10-CM | POA: Diagnosis not present

## 2015-11-02 DIAGNOSIS — M6281 Muscle weakness (generalized): Secondary | ICD-10-CM | POA: Diagnosis not present

## 2015-11-03 DIAGNOSIS — M6281 Muscle weakness (generalized): Secondary | ICD-10-CM | POA: Diagnosis not present

## 2015-11-03 DIAGNOSIS — S72141D Displaced intertrochanteric fracture of right femur, subsequent encounter for closed fracture with routine healing: Secondary | ICD-10-CM | POA: Diagnosis not present

## 2015-11-08 DIAGNOSIS — L853 Xerosis cutis: Secondary | ICD-10-CM | POA: Diagnosis not present

## 2015-12-25 DIAGNOSIS — D72829 Elevated white blood cell count, unspecified: Secondary | ICD-10-CM | POA: Diagnosis not present

## 2015-12-25 DIAGNOSIS — N179 Acute kidney failure, unspecified: Secondary | ICD-10-CM | POA: Diagnosis not present

## 2015-12-25 DIAGNOSIS — R197 Diarrhea, unspecified: Secondary | ICD-10-CM | POA: Diagnosis not present

## 2015-12-25 DIAGNOSIS — E119 Type 2 diabetes mellitus without complications: Secondary | ICD-10-CM | POA: Diagnosis not present

## 2016-01-09 DIAGNOSIS — H40021 Open angle with borderline findings, high risk, right eye: Secondary | ICD-10-CM | POA: Diagnosis not present

## 2016-01-09 DIAGNOSIS — H524 Presbyopia: Secondary | ICD-10-CM | POA: Diagnosis not present

## 2016-01-09 DIAGNOSIS — H44522 Atrophy of globe, left eye: Secondary | ICD-10-CM | POA: Diagnosis not present

## 2016-01-09 DIAGNOSIS — E119 Type 2 diabetes mellitus without complications: Secondary | ICD-10-CM | POA: Diagnosis not present

## 2016-01-09 DIAGNOSIS — Z794 Long term (current) use of insulin: Secondary | ICD-10-CM | POA: Diagnosis not present

## 2016-01-15 DIAGNOSIS — E46 Unspecified protein-calorie malnutrition: Secondary | ICD-10-CM | POA: Diagnosis not present

## 2016-01-15 DIAGNOSIS — R627 Adult failure to thrive: Secondary | ICD-10-CM | POA: Diagnosis not present

## 2016-01-15 DIAGNOSIS — L8990 Pressure ulcer of unspecified site, unspecified stage: Secondary | ICD-10-CM | POA: Diagnosis not present

## 2016-01-15 DIAGNOSIS — R630 Anorexia: Secondary | ICD-10-CM | POA: Diagnosis not present

## 2016-01-16 DIAGNOSIS — L84 Corns and callosities: Secondary | ICD-10-CM | POA: Diagnosis not present

## 2016-01-16 DIAGNOSIS — E1151 Type 2 diabetes mellitus with diabetic peripheral angiopathy without gangrene: Secondary | ICD-10-CM | POA: Diagnosis not present

## 2016-03-11 DIAGNOSIS — E119 Type 2 diabetes mellitus without complications: Secondary | ICD-10-CM | POA: Diagnosis not present

## 2016-03-11 DIAGNOSIS — F0280 Dementia in other diseases classified elsewhere without behavioral disturbance: Secondary | ICD-10-CM | POA: Diagnosis not present

## 2016-03-11 DIAGNOSIS — F800 Phonological disorder: Secondary | ICD-10-CM | POA: Diagnosis not present

## 2016-03-12 DIAGNOSIS — E119 Type 2 diabetes mellitus without complications: Secondary | ICD-10-CM | POA: Diagnosis not present

## 2016-03-12 DIAGNOSIS — F0280 Dementia in other diseases classified elsewhere without behavioral disturbance: Secondary | ICD-10-CM | POA: Diagnosis not present

## 2016-03-12 DIAGNOSIS — F800 Phonological disorder: Secondary | ICD-10-CM | POA: Diagnosis not present

## 2016-03-14 DIAGNOSIS — E119 Type 2 diabetes mellitus without complications: Secondary | ICD-10-CM | POA: Diagnosis not present

## 2016-03-14 DIAGNOSIS — F800 Phonological disorder: Secondary | ICD-10-CM | POA: Diagnosis not present

## 2016-03-14 DIAGNOSIS — F0280 Dementia in other diseases classified elsewhere without behavioral disturbance: Secondary | ICD-10-CM | POA: Diagnosis not present

## 2016-03-18 DIAGNOSIS — E119 Type 2 diabetes mellitus without complications: Secondary | ICD-10-CM | POA: Diagnosis not present

## 2016-03-18 DIAGNOSIS — F800 Phonological disorder: Secondary | ICD-10-CM | POA: Diagnosis not present

## 2016-03-18 DIAGNOSIS — F0280 Dementia in other diseases classified elsewhere without behavioral disturbance: Secondary | ICD-10-CM | POA: Diagnosis not present

## 2016-03-22 DIAGNOSIS — E559 Vitamin D deficiency, unspecified: Secondary | ICD-10-CM | POA: Diagnosis not present

## 2016-03-22 DIAGNOSIS — E119 Type 2 diabetes mellitus without complications: Secondary | ICD-10-CM | POA: Diagnosis not present

## 2016-03-22 DIAGNOSIS — F0280 Dementia in other diseases classified elsewhere without behavioral disturbance: Secondary | ICD-10-CM | POA: Diagnosis not present

## 2016-03-22 DIAGNOSIS — F800 Phonological disorder: Secondary | ICD-10-CM | POA: Diagnosis not present

## 2016-03-22 DIAGNOSIS — R4182 Altered mental status, unspecified: Secondary | ICD-10-CM | POA: Diagnosis not present

## 2016-03-23 DIAGNOSIS — E119 Type 2 diabetes mellitus without complications: Secondary | ICD-10-CM | POA: Diagnosis not present

## 2016-03-23 DIAGNOSIS — F800 Phonological disorder: Secondary | ICD-10-CM | POA: Diagnosis not present

## 2016-03-23 DIAGNOSIS — F0280 Dementia in other diseases classified elsewhere without behavioral disturbance: Secondary | ICD-10-CM | POA: Diagnosis not present

## 2016-03-25 DIAGNOSIS — F0280 Dementia in other diseases classified elsewhere without behavioral disturbance: Secondary | ICD-10-CM | POA: Diagnosis not present

## 2016-03-25 DIAGNOSIS — E119 Type 2 diabetes mellitus without complications: Secondary | ICD-10-CM | POA: Diagnosis not present

## 2016-03-25 DIAGNOSIS — F800 Phonological disorder: Secondary | ICD-10-CM | POA: Diagnosis not present

## 2016-03-26 DIAGNOSIS — E119 Type 2 diabetes mellitus without complications: Secondary | ICD-10-CM | POA: Diagnosis not present

## 2016-03-26 DIAGNOSIS — F0280 Dementia in other diseases classified elsewhere without behavioral disturbance: Secondary | ICD-10-CM | POA: Diagnosis not present

## 2016-03-26 DIAGNOSIS — F800 Phonological disorder: Secondary | ICD-10-CM | POA: Diagnosis not present

## 2016-03-27 DIAGNOSIS — F0280 Dementia in other diseases classified elsewhere without behavioral disturbance: Secondary | ICD-10-CM | POA: Diagnosis not present

## 2016-03-27 DIAGNOSIS — F800 Phonological disorder: Secondary | ICD-10-CM | POA: Diagnosis not present

## 2016-03-27 DIAGNOSIS — E119 Type 2 diabetes mellitus without complications: Secondary | ICD-10-CM | POA: Diagnosis not present

## 2016-04-01 DIAGNOSIS — E119 Type 2 diabetes mellitus without complications: Secondary | ICD-10-CM | POA: Diagnosis not present

## 2016-04-01 DIAGNOSIS — F800 Phonological disorder: Secondary | ICD-10-CM | POA: Diagnosis not present

## 2016-04-01 DIAGNOSIS — F0280 Dementia in other diseases classified elsewhere without behavioral disturbance: Secondary | ICD-10-CM | POA: Diagnosis not present

## 2016-04-03 DIAGNOSIS — F800 Phonological disorder: Secondary | ICD-10-CM | POA: Diagnosis not present

## 2016-04-03 DIAGNOSIS — E119 Type 2 diabetes mellitus without complications: Secondary | ICD-10-CM | POA: Diagnosis not present

## 2016-04-03 DIAGNOSIS — F0280 Dementia in other diseases classified elsewhere without behavioral disturbance: Secondary | ICD-10-CM | POA: Diagnosis not present

## 2016-04-05 DIAGNOSIS — F0280 Dementia in other diseases classified elsewhere without behavioral disturbance: Secondary | ICD-10-CM | POA: Diagnosis not present

## 2016-04-05 DIAGNOSIS — F800 Phonological disorder: Secondary | ICD-10-CM | POA: Diagnosis not present

## 2016-04-05 DIAGNOSIS — E119 Type 2 diabetes mellitus without complications: Secondary | ICD-10-CM | POA: Diagnosis not present

## 2016-04-08 DIAGNOSIS — E119 Type 2 diabetes mellitus without complications: Secondary | ICD-10-CM | POA: Diagnosis not present

## 2016-04-08 DIAGNOSIS — F0280 Dementia in other diseases classified elsewhere without behavioral disturbance: Secondary | ICD-10-CM | POA: Diagnosis not present

## 2016-04-08 DIAGNOSIS — F800 Phonological disorder: Secondary | ICD-10-CM | POA: Diagnosis not present

## 2016-04-11 DIAGNOSIS — F0280 Dementia in other diseases classified elsewhere without behavioral disturbance: Secondary | ICD-10-CM | POA: Diagnosis not present

## 2016-04-11 DIAGNOSIS — E119 Type 2 diabetes mellitus without complications: Secondary | ICD-10-CM | POA: Diagnosis not present

## 2016-04-11 DIAGNOSIS — F800 Phonological disorder: Secondary | ICD-10-CM | POA: Diagnosis not present

## 2016-04-12 DIAGNOSIS — F0280 Dementia in other diseases classified elsewhere without behavioral disturbance: Secondary | ICD-10-CM | POA: Diagnosis not present

## 2016-04-12 DIAGNOSIS — F800 Phonological disorder: Secondary | ICD-10-CM | POA: Diagnosis not present

## 2016-04-12 DIAGNOSIS — E119 Type 2 diabetes mellitus without complications: Secondary | ICD-10-CM | POA: Diagnosis not present

## 2016-04-13 DIAGNOSIS — E119 Type 2 diabetes mellitus without complications: Secondary | ICD-10-CM | POA: Diagnosis not present

## 2016-04-13 DIAGNOSIS — F0280 Dementia in other diseases classified elsewhere without behavioral disturbance: Secondary | ICD-10-CM | POA: Diagnosis not present

## 2016-04-13 DIAGNOSIS — F800 Phonological disorder: Secondary | ICD-10-CM | POA: Diagnosis not present

## 2016-04-16 DIAGNOSIS — E119 Type 2 diabetes mellitus without complications: Secondary | ICD-10-CM | POA: Diagnosis not present

## 2016-04-16 DIAGNOSIS — F0280 Dementia in other diseases classified elsewhere without behavioral disturbance: Secondary | ICD-10-CM | POA: Diagnosis not present

## 2016-04-16 DIAGNOSIS — F800 Phonological disorder: Secondary | ICD-10-CM | POA: Diagnosis not present

## 2016-04-17 DIAGNOSIS — F800 Phonological disorder: Secondary | ICD-10-CM | POA: Diagnosis not present

## 2016-04-17 DIAGNOSIS — E119 Type 2 diabetes mellitus without complications: Secondary | ICD-10-CM | POA: Diagnosis not present

## 2016-04-17 DIAGNOSIS — F0280 Dementia in other diseases classified elsewhere without behavioral disturbance: Secondary | ICD-10-CM | POA: Diagnosis not present

## 2016-04-19 DIAGNOSIS — F0280 Dementia in other diseases classified elsewhere without behavioral disturbance: Secondary | ICD-10-CM | POA: Diagnosis not present

## 2016-04-19 DIAGNOSIS — E119 Type 2 diabetes mellitus without complications: Secondary | ICD-10-CM | POA: Diagnosis not present

## 2016-04-19 DIAGNOSIS — F800 Phonological disorder: Secondary | ICD-10-CM | POA: Diagnosis not present

## 2016-04-20 DIAGNOSIS — F800 Phonological disorder: Secondary | ICD-10-CM | POA: Diagnosis not present

## 2016-04-20 DIAGNOSIS — E119 Type 2 diabetes mellitus without complications: Secondary | ICD-10-CM | POA: Diagnosis not present

## 2016-04-20 DIAGNOSIS — F0280 Dementia in other diseases classified elsewhere without behavioral disturbance: Secondary | ICD-10-CM | POA: Diagnosis not present

## 2016-04-21 DIAGNOSIS — F800 Phonological disorder: Secondary | ICD-10-CM | POA: Diagnosis not present

## 2016-04-21 DIAGNOSIS — F0280 Dementia in other diseases classified elsewhere without behavioral disturbance: Secondary | ICD-10-CM | POA: Diagnosis not present

## 2016-04-21 DIAGNOSIS — E119 Type 2 diabetes mellitus without complications: Secondary | ICD-10-CM | POA: Diagnosis not present

## 2016-04-22 DIAGNOSIS — F800 Phonological disorder: Secondary | ICD-10-CM | POA: Diagnosis not present

## 2016-04-22 DIAGNOSIS — F0280 Dementia in other diseases classified elsewhere without behavioral disturbance: Secondary | ICD-10-CM | POA: Diagnosis not present

## 2016-04-22 DIAGNOSIS — E119 Type 2 diabetes mellitus without complications: Secondary | ICD-10-CM | POA: Diagnosis not present

## 2016-04-23 DIAGNOSIS — E119 Type 2 diabetes mellitus without complications: Secondary | ICD-10-CM | POA: Diagnosis not present

## 2016-04-23 DIAGNOSIS — F0280 Dementia in other diseases classified elsewhere without behavioral disturbance: Secondary | ICD-10-CM | POA: Diagnosis not present

## 2016-04-23 DIAGNOSIS — F800 Phonological disorder: Secondary | ICD-10-CM | POA: Diagnosis not present

## 2016-04-26 DIAGNOSIS — E119 Type 2 diabetes mellitus without complications: Secondary | ICD-10-CM | POA: Diagnosis not present

## 2016-04-26 DIAGNOSIS — F800 Phonological disorder: Secondary | ICD-10-CM | POA: Diagnosis not present

## 2016-04-26 DIAGNOSIS — F0280 Dementia in other diseases classified elsewhere without behavioral disturbance: Secondary | ICD-10-CM | POA: Diagnosis not present

## 2016-04-29 DIAGNOSIS — F800 Phonological disorder: Secondary | ICD-10-CM | POA: Diagnosis not present

## 2016-04-29 DIAGNOSIS — E119 Type 2 diabetes mellitus without complications: Secondary | ICD-10-CM | POA: Diagnosis not present

## 2016-04-29 DIAGNOSIS — F0280 Dementia in other diseases classified elsewhere without behavioral disturbance: Secondary | ICD-10-CM | POA: Diagnosis not present

## 2016-04-30 DIAGNOSIS — F800 Phonological disorder: Secondary | ICD-10-CM | POA: Diagnosis not present

## 2016-04-30 DIAGNOSIS — E119 Type 2 diabetes mellitus without complications: Secondary | ICD-10-CM | POA: Diagnosis not present

## 2016-04-30 DIAGNOSIS — F0280 Dementia in other diseases classified elsewhere without behavioral disturbance: Secondary | ICD-10-CM | POA: Diagnosis not present

## 2016-05-01 DIAGNOSIS — F0280 Dementia in other diseases classified elsewhere without behavioral disturbance: Secondary | ICD-10-CM | POA: Diagnosis not present

## 2016-05-01 DIAGNOSIS — F800 Phonological disorder: Secondary | ICD-10-CM | POA: Diagnosis not present

## 2016-05-01 DIAGNOSIS — E119 Type 2 diabetes mellitus without complications: Secondary | ICD-10-CM | POA: Diagnosis not present

## 2016-05-04 DIAGNOSIS — F800 Phonological disorder: Secondary | ICD-10-CM | POA: Diagnosis not present

## 2016-05-04 DIAGNOSIS — E119 Type 2 diabetes mellitus without complications: Secondary | ICD-10-CM | POA: Diagnosis not present

## 2016-05-04 DIAGNOSIS — F0280 Dementia in other diseases classified elsewhere without behavioral disturbance: Secondary | ICD-10-CM | POA: Diagnosis not present

## 2016-05-05 DIAGNOSIS — F0280 Dementia in other diseases classified elsewhere without behavioral disturbance: Secondary | ICD-10-CM | POA: Diagnosis not present

## 2016-05-05 DIAGNOSIS — F800 Phonological disorder: Secondary | ICD-10-CM | POA: Diagnosis not present

## 2016-05-05 DIAGNOSIS — E119 Type 2 diabetes mellitus without complications: Secondary | ICD-10-CM | POA: Diagnosis not present

## 2016-05-06 DIAGNOSIS — F800 Phonological disorder: Secondary | ICD-10-CM | POA: Diagnosis not present

## 2016-05-06 DIAGNOSIS — E119 Type 2 diabetes mellitus without complications: Secondary | ICD-10-CM | POA: Diagnosis not present

## 2016-05-06 DIAGNOSIS — F0280 Dementia in other diseases classified elsewhere without behavioral disturbance: Secondary | ICD-10-CM | POA: Diagnosis not present

## 2016-05-09 DIAGNOSIS — R1310 Dysphagia, unspecified: Secondary | ICD-10-CM | POA: Diagnosis not present

## 2016-05-09 DIAGNOSIS — F800 Phonological disorder: Secondary | ICD-10-CM | POA: Diagnosis not present

## 2016-05-09 DIAGNOSIS — E119 Type 2 diabetes mellitus without complications: Secondary | ICD-10-CM | POA: Diagnosis not present

## 2016-05-11 DIAGNOSIS — E119 Type 2 diabetes mellitus without complications: Secondary | ICD-10-CM | POA: Diagnosis not present

## 2016-05-11 DIAGNOSIS — F800 Phonological disorder: Secondary | ICD-10-CM | POA: Diagnosis not present

## 2016-05-11 DIAGNOSIS — R1310 Dysphagia, unspecified: Secondary | ICD-10-CM | POA: Diagnosis not present

## 2016-05-12 DIAGNOSIS — F800 Phonological disorder: Secondary | ICD-10-CM | POA: Diagnosis not present

## 2016-05-12 DIAGNOSIS — R1310 Dysphagia, unspecified: Secondary | ICD-10-CM | POA: Diagnosis not present

## 2016-05-12 DIAGNOSIS — E119 Type 2 diabetes mellitus without complications: Secondary | ICD-10-CM | POA: Diagnosis not present

## 2016-05-14 DIAGNOSIS — E119 Type 2 diabetes mellitus without complications: Secondary | ICD-10-CM | POA: Diagnosis not present

## 2016-05-14 DIAGNOSIS — F800 Phonological disorder: Secondary | ICD-10-CM | POA: Diagnosis not present

## 2016-05-14 DIAGNOSIS — R1310 Dysphagia, unspecified: Secondary | ICD-10-CM | POA: Diagnosis not present

## 2016-05-15 DIAGNOSIS — R1310 Dysphagia, unspecified: Secondary | ICD-10-CM | POA: Diagnosis not present

## 2016-05-15 DIAGNOSIS — F800 Phonological disorder: Secondary | ICD-10-CM | POA: Diagnosis not present

## 2016-05-15 DIAGNOSIS — E119 Type 2 diabetes mellitus without complications: Secondary | ICD-10-CM | POA: Diagnosis not present

## 2016-05-17 DIAGNOSIS — R1310 Dysphagia, unspecified: Secondary | ICD-10-CM | POA: Diagnosis not present

## 2016-05-17 DIAGNOSIS — F800 Phonological disorder: Secondary | ICD-10-CM | POA: Diagnosis not present

## 2016-05-17 DIAGNOSIS — E119 Type 2 diabetes mellitus without complications: Secondary | ICD-10-CM | POA: Diagnosis not present

## 2016-05-20 DIAGNOSIS — N183 Chronic kidney disease, stage 3 (moderate): Secondary | ICD-10-CM | POA: Diagnosis not present

## 2016-05-20 DIAGNOSIS — R1310 Dysphagia, unspecified: Secondary | ICD-10-CM | POA: Diagnosis not present

## 2016-05-20 DIAGNOSIS — G309 Alzheimer's disease, unspecified: Secondary | ICD-10-CM | POA: Diagnosis not present

## 2016-05-20 DIAGNOSIS — M79641 Pain in right hand: Secondary | ICD-10-CM | POA: Diagnosis not present

## 2016-05-20 DIAGNOSIS — F800 Phonological disorder: Secondary | ICD-10-CM | POA: Diagnosis not present

## 2016-05-20 DIAGNOSIS — M069 Rheumatoid arthritis, unspecified: Secondary | ICD-10-CM | POA: Diagnosis not present

## 2016-05-20 DIAGNOSIS — E119 Type 2 diabetes mellitus without complications: Secondary | ICD-10-CM | POA: Diagnosis not present

## 2016-05-21 DIAGNOSIS — N179 Acute kidney failure, unspecified: Secondary | ICD-10-CM | POA: Diagnosis not present

## 2016-05-21 DIAGNOSIS — F800 Phonological disorder: Secondary | ICD-10-CM | POA: Diagnosis not present

## 2016-05-21 DIAGNOSIS — R1310 Dysphagia, unspecified: Secondary | ICD-10-CM | POA: Diagnosis not present

## 2016-05-21 DIAGNOSIS — E119 Type 2 diabetes mellitus without complications: Secondary | ICD-10-CM | POA: Diagnosis not present

## 2016-05-22 DIAGNOSIS — E119 Type 2 diabetes mellitus without complications: Secondary | ICD-10-CM | POA: Diagnosis not present

## 2016-05-22 DIAGNOSIS — F800 Phonological disorder: Secondary | ICD-10-CM | POA: Diagnosis not present

## 2016-05-22 DIAGNOSIS — R1310 Dysphagia, unspecified: Secondary | ICD-10-CM | POA: Diagnosis not present

## 2016-05-23 DIAGNOSIS — D649 Anemia, unspecified: Secondary | ICD-10-CM | POA: Diagnosis not present

## 2016-05-23 DIAGNOSIS — R1310 Dysphagia, unspecified: Secondary | ICD-10-CM | POA: Diagnosis not present

## 2016-05-23 DIAGNOSIS — M79641 Pain in right hand: Secondary | ICD-10-CM | POA: Diagnosis not present

## 2016-05-23 DIAGNOSIS — N183 Chronic kidney disease, stage 3 (moderate): Secondary | ICD-10-CM | POA: Diagnosis not present

## 2016-05-23 DIAGNOSIS — G309 Alzheimer's disease, unspecified: Secondary | ICD-10-CM | POA: Diagnosis not present

## 2016-05-23 DIAGNOSIS — E119 Type 2 diabetes mellitus without complications: Secondary | ICD-10-CM | POA: Diagnosis not present

## 2016-05-23 DIAGNOSIS — F800 Phonological disorder: Secondary | ICD-10-CM | POA: Diagnosis not present

## 2016-05-24 DIAGNOSIS — F800 Phonological disorder: Secondary | ICD-10-CM | POA: Diagnosis not present

## 2016-05-24 DIAGNOSIS — E119 Type 2 diabetes mellitus without complications: Secondary | ICD-10-CM | POA: Diagnosis not present

## 2016-05-24 DIAGNOSIS — R1310 Dysphagia, unspecified: Secondary | ICD-10-CM | POA: Diagnosis not present

## 2016-05-26 DIAGNOSIS — F800 Phonological disorder: Secondary | ICD-10-CM | POA: Diagnosis not present

## 2016-05-26 DIAGNOSIS — E119 Type 2 diabetes mellitus without complications: Secondary | ICD-10-CM | POA: Diagnosis not present

## 2016-05-26 DIAGNOSIS — R1310 Dysphagia, unspecified: Secondary | ICD-10-CM | POA: Diagnosis not present

## 2016-05-27 DIAGNOSIS — E119 Type 2 diabetes mellitus without complications: Secondary | ICD-10-CM | POA: Diagnosis not present

## 2016-05-28 DIAGNOSIS — F800 Phonological disorder: Secondary | ICD-10-CM | POA: Diagnosis not present

## 2016-05-28 DIAGNOSIS — E119 Type 2 diabetes mellitus without complications: Secondary | ICD-10-CM | POA: Diagnosis not present

## 2016-05-28 DIAGNOSIS — R1310 Dysphagia, unspecified: Secondary | ICD-10-CM | POA: Diagnosis not present

## 2016-05-29 DIAGNOSIS — R1310 Dysphagia, unspecified: Secondary | ICD-10-CM | POA: Diagnosis not present

## 2016-05-29 DIAGNOSIS — F800 Phonological disorder: Secondary | ICD-10-CM | POA: Diagnosis not present

## 2016-05-29 DIAGNOSIS — E119 Type 2 diabetes mellitus without complications: Secondary | ICD-10-CM | POA: Diagnosis not present

## 2016-05-30 DIAGNOSIS — F800 Phonological disorder: Secondary | ICD-10-CM | POA: Diagnosis not present

## 2016-05-30 DIAGNOSIS — R1310 Dysphagia, unspecified: Secondary | ICD-10-CM | POA: Diagnosis not present

## 2016-05-30 DIAGNOSIS — E119 Type 2 diabetes mellitus without complications: Secondary | ICD-10-CM | POA: Diagnosis not present

## 2016-06-01 DIAGNOSIS — F800 Phonological disorder: Secondary | ICD-10-CM | POA: Diagnosis not present

## 2016-06-01 DIAGNOSIS — R1310 Dysphagia, unspecified: Secondary | ICD-10-CM | POA: Diagnosis not present

## 2016-06-01 DIAGNOSIS — E119 Type 2 diabetes mellitus without complications: Secondary | ICD-10-CM | POA: Diagnosis not present

## 2016-06-03 DIAGNOSIS — R1310 Dysphagia, unspecified: Secondary | ICD-10-CM | POA: Diagnosis not present

## 2016-06-03 DIAGNOSIS — F800 Phonological disorder: Secondary | ICD-10-CM | POA: Diagnosis not present

## 2016-06-03 DIAGNOSIS — E119 Type 2 diabetes mellitus without complications: Secondary | ICD-10-CM | POA: Diagnosis not present

## 2016-06-04 DIAGNOSIS — R1310 Dysphagia, unspecified: Secondary | ICD-10-CM | POA: Diagnosis not present

## 2016-06-04 DIAGNOSIS — E119 Type 2 diabetes mellitus without complications: Secondary | ICD-10-CM | POA: Diagnosis not present

## 2016-06-04 DIAGNOSIS — F800 Phonological disorder: Secondary | ICD-10-CM | POA: Diagnosis not present

## 2016-06-05 DIAGNOSIS — R1310 Dysphagia, unspecified: Secondary | ICD-10-CM | POA: Diagnosis not present

## 2016-06-05 DIAGNOSIS — F800 Phonological disorder: Secondary | ICD-10-CM | POA: Diagnosis not present

## 2016-06-05 DIAGNOSIS — E119 Type 2 diabetes mellitus without complications: Secondary | ICD-10-CM | POA: Diagnosis not present

## 2016-06-06 DIAGNOSIS — R1310 Dysphagia, unspecified: Secondary | ICD-10-CM | POA: Diagnosis not present

## 2016-06-06 DIAGNOSIS — E119 Type 2 diabetes mellitus without complications: Secondary | ICD-10-CM | POA: Diagnosis not present

## 2016-06-06 DIAGNOSIS — F800 Phonological disorder: Secondary | ICD-10-CM | POA: Diagnosis not present

## 2016-06-07 DIAGNOSIS — F800 Phonological disorder: Secondary | ICD-10-CM | POA: Diagnosis not present

## 2016-06-07 DIAGNOSIS — E119 Type 2 diabetes mellitus without complications: Secondary | ICD-10-CM | POA: Diagnosis not present

## 2016-06-07 DIAGNOSIS — R1310 Dysphagia, unspecified: Secondary | ICD-10-CM | POA: Diagnosis not present

## 2016-06-10 DIAGNOSIS — R1310 Dysphagia, unspecified: Secondary | ICD-10-CM | POA: Diagnosis not present

## 2016-06-10 DIAGNOSIS — F800 Phonological disorder: Secondary | ICD-10-CM | POA: Diagnosis not present

## 2016-06-10 DIAGNOSIS — E119 Type 2 diabetes mellitus without complications: Secondary | ICD-10-CM | POA: Diagnosis not present

## 2016-06-11 DIAGNOSIS — E119 Type 2 diabetes mellitus without complications: Secondary | ICD-10-CM | POA: Diagnosis not present

## 2016-06-11 DIAGNOSIS — N179 Acute kidney failure, unspecified: Secondary | ICD-10-CM | POA: Diagnosis not present

## 2016-06-11 DIAGNOSIS — F800 Phonological disorder: Secondary | ICD-10-CM | POA: Diagnosis not present

## 2016-06-11 DIAGNOSIS — R1310 Dysphagia, unspecified: Secondary | ICD-10-CM | POA: Diagnosis not present

## 2016-06-12 DIAGNOSIS — R1310 Dysphagia, unspecified: Secondary | ICD-10-CM | POA: Diagnosis not present

## 2016-06-12 DIAGNOSIS — E119 Type 2 diabetes mellitus without complications: Secondary | ICD-10-CM | POA: Diagnosis not present

## 2016-06-12 DIAGNOSIS — F800 Phonological disorder: Secondary | ICD-10-CM | POA: Diagnosis not present

## 2016-06-13 DIAGNOSIS — E46 Unspecified protein-calorie malnutrition: Secondary | ICD-10-CM | POA: Diagnosis not present

## 2016-06-13 DIAGNOSIS — F800 Phonological disorder: Secondary | ICD-10-CM | POA: Diagnosis not present

## 2016-06-13 DIAGNOSIS — M79641 Pain in right hand: Secondary | ICD-10-CM | POA: Diagnosis not present

## 2016-06-13 DIAGNOSIS — R627 Adult failure to thrive: Secondary | ICD-10-CM | POA: Diagnosis not present

## 2016-06-13 DIAGNOSIS — N183 Chronic kidney disease, stage 3 (moderate): Secondary | ICD-10-CM | POA: Diagnosis not present

## 2016-06-13 DIAGNOSIS — R1310 Dysphagia, unspecified: Secondary | ICD-10-CM | POA: Diagnosis not present

## 2016-06-13 DIAGNOSIS — E119 Type 2 diabetes mellitus without complications: Secondary | ICD-10-CM | POA: Diagnosis not present

## 2016-06-17 DIAGNOSIS — F800 Phonological disorder: Secondary | ICD-10-CM | POA: Diagnosis not present

## 2016-06-17 DIAGNOSIS — R1310 Dysphagia, unspecified: Secondary | ICD-10-CM | POA: Diagnosis not present

## 2016-06-17 DIAGNOSIS — E119 Type 2 diabetes mellitus without complications: Secondary | ICD-10-CM | POA: Diagnosis not present

## 2016-06-18 DIAGNOSIS — F800 Phonological disorder: Secondary | ICD-10-CM | POA: Diagnosis not present

## 2016-06-18 DIAGNOSIS — E119 Type 2 diabetes mellitus without complications: Secondary | ICD-10-CM | POA: Diagnosis not present

## 2016-06-18 DIAGNOSIS — R1310 Dysphagia, unspecified: Secondary | ICD-10-CM | POA: Diagnosis not present

## 2016-06-19 DIAGNOSIS — F800 Phonological disorder: Secondary | ICD-10-CM | POA: Diagnosis not present

## 2016-06-19 DIAGNOSIS — E119 Type 2 diabetes mellitus without complications: Secondary | ICD-10-CM | POA: Diagnosis not present

## 2016-06-19 DIAGNOSIS — R1310 Dysphagia, unspecified: Secondary | ICD-10-CM | POA: Diagnosis not present

## 2016-06-20 DIAGNOSIS — R1310 Dysphagia, unspecified: Secondary | ICD-10-CM | POA: Diagnosis not present

## 2016-06-20 DIAGNOSIS — F800 Phonological disorder: Secondary | ICD-10-CM | POA: Diagnosis not present

## 2016-06-20 DIAGNOSIS — E559 Vitamin D deficiency, unspecified: Secondary | ICD-10-CM | POA: Diagnosis not present

## 2016-06-20 DIAGNOSIS — E119 Type 2 diabetes mellitus without complications: Secondary | ICD-10-CM | POA: Diagnosis not present

## 2016-06-21 DIAGNOSIS — E119 Type 2 diabetes mellitus without complications: Secondary | ICD-10-CM | POA: Diagnosis not present

## 2016-06-21 DIAGNOSIS — F800 Phonological disorder: Secondary | ICD-10-CM | POA: Diagnosis not present

## 2016-06-21 DIAGNOSIS — R1310 Dysphagia, unspecified: Secondary | ICD-10-CM | POA: Diagnosis not present

## 2016-06-24 DIAGNOSIS — R1310 Dysphagia, unspecified: Secondary | ICD-10-CM | POA: Diagnosis not present

## 2016-06-24 DIAGNOSIS — E119 Type 2 diabetes mellitus without complications: Secondary | ICD-10-CM | POA: Diagnosis not present

## 2016-06-24 DIAGNOSIS — F800 Phonological disorder: Secondary | ICD-10-CM | POA: Diagnosis not present

## 2016-06-25 DIAGNOSIS — E1151 Type 2 diabetes mellitus with diabetic peripheral angiopathy without gangrene: Secondary | ICD-10-CM | POA: Diagnosis not present

## 2016-06-25 DIAGNOSIS — L84 Corns and callosities: Secondary | ICD-10-CM | POA: Diagnosis not present

## 2016-09-10 DIAGNOSIS — E119 Type 2 diabetes mellitus without complications: Secondary | ICD-10-CM | POA: Diagnosis not present

## 2016-09-10 DIAGNOSIS — D72829 Elevated white blood cell count, unspecified: Secondary | ICD-10-CM | POA: Diagnosis not present

## 2016-09-10 DIAGNOSIS — N179 Acute kidney failure, unspecified: Secondary | ICD-10-CM | POA: Diagnosis not present

## 2016-09-10 DIAGNOSIS — E559 Vitamin D deficiency, unspecified: Secondary | ICD-10-CM | POA: Diagnosis not present

## 2016-10-10 DIAGNOSIS — M069 Rheumatoid arthritis, unspecified: Secondary | ICD-10-CM | POA: Diagnosis not present

## 2016-10-10 DIAGNOSIS — I10 Essential (primary) hypertension: Secondary | ICD-10-CM | POA: Diagnosis not present

## 2016-10-10 DIAGNOSIS — G309 Alzheimer's disease, unspecified: Secondary | ICD-10-CM | POA: Diagnosis not present

## 2016-10-10 DIAGNOSIS — E559 Vitamin D deficiency, unspecified: Secondary | ICD-10-CM | POA: Diagnosis not present

## 2016-10-18 DIAGNOSIS — D72829 Elevated white blood cell count, unspecified: Secondary | ICD-10-CM | POA: Diagnosis not present

## 2016-10-18 DIAGNOSIS — E119 Type 2 diabetes mellitus without complications: Secondary | ICD-10-CM | POA: Diagnosis not present

## 2016-10-18 DIAGNOSIS — N179 Acute kidney failure, unspecified: Secondary | ICD-10-CM | POA: Diagnosis not present

## 2016-10-18 DIAGNOSIS — R197 Diarrhea, unspecified: Secondary | ICD-10-CM | POA: Diagnosis not present

## 2016-11-26 DIAGNOSIS — E1151 Type 2 diabetes mellitus with diabetic peripheral angiopathy without gangrene: Secondary | ICD-10-CM | POA: Diagnosis not present

## 2016-11-26 DIAGNOSIS — L84 Corns and callosities: Secondary | ICD-10-CM | POA: Diagnosis not present

## 2016-12-09 DIAGNOSIS — G309 Alzheimer's disease, unspecified: Secondary | ICD-10-CM | POA: Diagnosis not present

## 2016-12-09 DIAGNOSIS — N179 Acute kidney failure, unspecified: Secondary | ICD-10-CM | POA: Diagnosis not present

## 2016-12-09 DIAGNOSIS — I10 Essential (primary) hypertension: Secondary | ICD-10-CM | POA: Diagnosis not present

## 2016-12-09 DIAGNOSIS — E119 Type 2 diabetes mellitus without complications: Secondary | ICD-10-CM | POA: Diagnosis not present

## 2016-12-09 DIAGNOSIS — E46 Unspecified protein-calorie malnutrition: Secondary | ICD-10-CM | POA: Diagnosis not present

## 2016-12-16 DIAGNOSIS — R1310 Dysphagia, unspecified: Secondary | ICD-10-CM | POA: Diagnosis not present

## 2016-12-16 DIAGNOSIS — E119 Type 2 diabetes mellitus without complications: Secondary | ICD-10-CM | POA: Diagnosis not present

## 2016-12-16 DIAGNOSIS — M6281 Muscle weakness (generalized): Secondary | ICD-10-CM | POA: Diagnosis not present

## 2016-12-17 DIAGNOSIS — R1310 Dysphagia, unspecified: Secondary | ICD-10-CM | POA: Diagnosis not present

## 2016-12-17 DIAGNOSIS — M6281 Muscle weakness (generalized): Secondary | ICD-10-CM | POA: Diagnosis not present

## 2016-12-17 DIAGNOSIS — E119 Type 2 diabetes mellitus without complications: Secondary | ICD-10-CM | POA: Diagnosis not present

## 2016-12-18 DIAGNOSIS — E119 Type 2 diabetes mellitus without complications: Secondary | ICD-10-CM | POA: Diagnosis not present

## 2016-12-18 DIAGNOSIS — M6281 Muscle weakness (generalized): Secondary | ICD-10-CM | POA: Diagnosis not present

## 2016-12-18 DIAGNOSIS — R1310 Dysphagia, unspecified: Secondary | ICD-10-CM | POA: Diagnosis not present

## 2016-12-19 DIAGNOSIS — R319 Hematuria, unspecified: Secondary | ICD-10-CM | POA: Diagnosis not present

## 2016-12-19 DIAGNOSIS — E119 Type 2 diabetes mellitus without complications: Secondary | ICD-10-CM | POA: Diagnosis not present

## 2016-12-19 DIAGNOSIS — M6281 Muscle weakness (generalized): Secondary | ICD-10-CM | POA: Diagnosis not present

## 2016-12-19 DIAGNOSIS — N390 Urinary tract infection, site not specified: Secondary | ICD-10-CM | POA: Diagnosis not present

## 2016-12-19 DIAGNOSIS — R1310 Dysphagia, unspecified: Secondary | ICD-10-CM | POA: Diagnosis not present

## 2016-12-19 DIAGNOSIS — N183 Chronic kidney disease, stage 3 (moderate): Secondary | ICD-10-CM | POA: Diagnosis not present

## 2016-12-19 DIAGNOSIS — M79641 Pain in right hand: Secondary | ICD-10-CM | POA: Diagnosis not present

## 2016-12-20 DIAGNOSIS — R69 Illness, unspecified: Secondary | ICD-10-CM | POA: Diagnosis not present

## 2016-12-20 DIAGNOSIS — R1310 Dysphagia, unspecified: Secondary | ICD-10-CM | POA: Diagnosis not present

## 2016-12-20 DIAGNOSIS — M6281 Muscle weakness (generalized): Secondary | ICD-10-CM | POA: Diagnosis not present

## 2016-12-20 DIAGNOSIS — E119 Type 2 diabetes mellitus without complications: Secondary | ICD-10-CM | POA: Diagnosis not present

## 2016-12-22 DIAGNOSIS — M6281 Muscle weakness (generalized): Secondary | ICD-10-CM | POA: Diagnosis not present

## 2016-12-22 DIAGNOSIS — R1310 Dysphagia, unspecified: Secondary | ICD-10-CM | POA: Diagnosis not present

## 2016-12-22 DIAGNOSIS — E119 Type 2 diabetes mellitus without complications: Secondary | ICD-10-CM | POA: Diagnosis not present

## 2016-12-23 DIAGNOSIS — M6281 Muscle weakness (generalized): Secondary | ICD-10-CM | POA: Diagnosis not present

## 2016-12-23 DIAGNOSIS — N390 Urinary tract infection, site not specified: Secondary | ICD-10-CM | POA: Diagnosis not present

## 2016-12-23 DIAGNOSIS — N183 Chronic kidney disease, stage 3 (moderate): Secondary | ICD-10-CM | POA: Diagnosis not present

## 2016-12-23 DIAGNOSIS — R1310 Dysphagia, unspecified: Secondary | ICD-10-CM | POA: Diagnosis not present

## 2016-12-23 DIAGNOSIS — M79641 Pain in right hand: Secondary | ICD-10-CM | POA: Diagnosis not present

## 2016-12-23 DIAGNOSIS — E119 Type 2 diabetes mellitus without complications: Secondary | ICD-10-CM | POA: Diagnosis not present

## 2016-12-24 DIAGNOSIS — M6281 Muscle weakness (generalized): Secondary | ICD-10-CM | POA: Diagnosis not present

## 2016-12-24 DIAGNOSIS — E119 Type 2 diabetes mellitus without complications: Secondary | ICD-10-CM | POA: Diagnosis not present

## 2016-12-24 DIAGNOSIS — R1310 Dysphagia, unspecified: Secondary | ICD-10-CM | POA: Diagnosis not present

## 2016-12-25 DIAGNOSIS — R1310 Dysphagia, unspecified: Secondary | ICD-10-CM | POA: Diagnosis not present

## 2016-12-25 DIAGNOSIS — M6281 Muscle weakness (generalized): Secondary | ICD-10-CM | POA: Diagnosis not present

## 2016-12-25 DIAGNOSIS — E119 Type 2 diabetes mellitus without complications: Secondary | ICD-10-CM | POA: Diagnosis not present

## 2016-12-26 DIAGNOSIS — M6281 Muscle weakness (generalized): Secondary | ICD-10-CM | POA: Diagnosis not present

## 2016-12-26 DIAGNOSIS — R1310 Dysphagia, unspecified: Secondary | ICD-10-CM | POA: Diagnosis not present

## 2016-12-26 DIAGNOSIS — E119 Type 2 diabetes mellitus without complications: Secondary | ICD-10-CM | POA: Diagnosis not present

## 2016-12-27 DIAGNOSIS — M6281 Muscle weakness (generalized): Secondary | ICD-10-CM | POA: Diagnosis not present

## 2016-12-27 DIAGNOSIS — R1310 Dysphagia, unspecified: Secondary | ICD-10-CM | POA: Diagnosis not present

## 2016-12-27 DIAGNOSIS — E119 Type 2 diabetes mellitus without complications: Secondary | ICD-10-CM | POA: Diagnosis not present

## 2016-12-28 DIAGNOSIS — M6281 Muscle weakness (generalized): Secondary | ICD-10-CM | POA: Diagnosis not present

## 2016-12-28 DIAGNOSIS — R1310 Dysphagia, unspecified: Secondary | ICD-10-CM | POA: Diagnosis not present

## 2016-12-28 DIAGNOSIS — E119 Type 2 diabetes mellitus without complications: Secondary | ICD-10-CM | POA: Diagnosis not present

## 2016-12-29 DIAGNOSIS — E119 Type 2 diabetes mellitus without complications: Secondary | ICD-10-CM | POA: Diagnosis not present

## 2016-12-29 DIAGNOSIS — M6281 Muscle weakness (generalized): Secondary | ICD-10-CM | POA: Diagnosis not present

## 2016-12-29 DIAGNOSIS — R1310 Dysphagia, unspecified: Secondary | ICD-10-CM | POA: Diagnosis not present

## 2016-12-30 DIAGNOSIS — M6281 Muscle weakness (generalized): Secondary | ICD-10-CM | POA: Diagnosis not present

## 2016-12-30 DIAGNOSIS — E119 Type 2 diabetes mellitus without complications: Secondary | ICD-10-CM | POA: Diagnosis not present

## 2016-12-30 DIAGNOSIS — R1310 Dysphagia, unspecified: Secondary | ICD-10-CM | POA: Diagnosis not present

## 2016-12-31 DIAGNOSIS — M6281 Muscle weakness (generalized): Secondary | ICD-10-CM | POA: Diagnosis not present

## 2016-12-31 DIAGNOSIS — E119 Type 2 diabetes mellitus without complications: Secondary | ICD-10-CM | POA: Diagnosis not present

## 2016-12-31 DIAGNOSIS — R1310 Dysphagia, unspecified: Secondary | ICD-10-CM | POA: Diagnosis not present

## 2017-01-01 DIAGNOSIS — R1310 Dysphagia, unspecified: Secondary | ICD-10-CM | POA: Diagnosis not present

## 2017-01-01 DIAGNOSIS — E119 Type 2 diabetes mellitus without complications: Secondary | ICD-10-CM | POA: Diagnosis not present

## 2017-01-01 DIAGNOSIS — M6281 Muscle weakness (generalized): Secondary | ICD-10-CM | POA: Diagnosis not present

## 2017-01-02 DIAGNOSIS — E119 Type 2 diabetes mellitus without complications: Secondary | ICD-10-CM | POA: Diagnosis not present

## 2017-01-02 DIAGNOSIS — M6281 Muscle weakness (generalized): Secondary | ICD-10-CM | POA: Diagnosis not present

## 2017-01-02 DIAGNOSIS — R1310 Dysphagia, unspecified: Secondary | ICD-10-CM | POA: Diagnosis not present

## 2017-01-03 DIAGNOSIS — E119 Type 2 diabetes mellitus without complications: Secondary | ICD-10-CM | POA: Diagnosis not present

## 2017-01-03 DIAGNOSIS — R1310 Dysphagia, unspecified: Secondary | ICD-10-CM | POA: Diagnosis not present

## 2017-01-03 DIAGNOSIS — M6281 Muscle weakness (generalized): Secondary | ICD-10-CM | POA: Diagnosis not present

## 2017-01-05 DIAGNOSIS — M6281 Muscle weakness (generalized): Secondary | ICD-10-CM | POA: Diagnosis not present

## 2017-01-05 DIAGNOSIS — R1310 Dysphagia, unspecified: Secondary | ICD-10-CM | POA: Diagnosis not present

## 2017-01-05 DIAGNOSIS — E119 Type 2 diabetes mellitus without complications: Secondary | ICD-10-CM | POA: Diagnosis not present

## 2017-01-06 DIAGNOSIS — R1310 Dysphagia, unspecified: Secondary | ICD-10-CM | POA: Diagnosis not present

## 2017-01-06 DIAGNOSIS — M6281 Muscle weakness (generalized): Secondary | ICD-10-CM | POA: Diagnosis not present

## 2017-01-06 DIAGNOSIS — E119 Type 2 diabetes mellitus without complications: Secondary | ICD-10-CM | POA: Diagnosis not present

## 2017-01-07 DIAGNOSIS — E119 Type 2 diabetes mellitus without complications: Secondary | ICD-10-CM | POA: Diagnosis not present

## 2017-01-07 DIAGNOSIS — M6281 Muscle weakness (generalized): Secondary | ICD-10-CM | POA: Diagnosis not present

## 2017-01-07 DIAGNOSIS — R1310 Dysphagia, unspecified: Secondary | ICD-10-CM | POA: Diagnosis not present

## 2017-01-08 DIAGNOSIS — M6281 Muscle weakness (generalized): Secondary | ICD-10-CM | POA: Diagnosis not present

## 2017-01-08 DIAGNOSIS — R1310 Dysphagia, unspecified: Secondary | ICD-10-CM | POA: Diagnosis not present

## 2017-01-08 DIAGNOSIS — E119 Type 2 diabetes mellitus without complications: Secondary | ICD-10-CM | POA: Diagnosis not present

## 2017-01-09 DIAGNOSIS — E119 Type 2 diabetes mellitus without complications: Secondary | ICD-10-CM | POA: Diagnosis not present

## 2017-01-09 DIAGNOSIS — M6281 Muscle weakness (generalized): Secondary | ICD-10-CM | POA: Diagnosis not present

## 2017-01-09 DIAGNOSIS — R1310 Dysphagia, unspecified: Secondary | ICD-10-CM | POA: Diagnosis not present

## 2017-01-10 DIAGNOSIS — R1310 Dysphagia, unspecified: Secondary | ICD-10-CM | POA: Diagnosis not present

## 2017-01-10 DIAGNOSIS — M6281 Muscle weakness (generalized): Secondary | ICD-10-CM | POA: Diagnosis not present

## 2017-01-10 DIAGNOSIS — E119 Type 2 diabetes mellitus without complications: Secondary | ICD-10-CM | POA: Diagnosis not present

## 2017-01-12 DIAGNOSIS — R1310 Dysphagia, unspecified: Secondary | ICD-10-CM | POA: Diagnosis not present

## 2017-01-12 DIAGNOSIS — M6281 Muscle weakness (generalized): Secondary | ICD-10-CM | POA: Diagnosis not present

## 2017-01-12 DIAGNOSIS — E119 Type 2 diabetes mellitus without complications: Secondary | ICD-10-CM | POA: Diagnosis not present

## 2017-01-13 DIAGNOSIS — E119 Type 2 diabetes mellitus without complications: Secondary | ICD-10-CM | POA: Diagnosis not present

## 2017-01-13 DIAGNOSIS — R1310 Dysphagia, unspecified: Secondary | ICD-10-CM | POA: Diagnosis not present

## 2017-01-13 DIAGNOSIS — M6281 Muscle weakness (generalized): Secondary | ICD-10-CM | POA: Diagnosis not present

## 2017-01-14 DIAGNOSIS — R1310 Dysphagia, unspecified: Secondary | ICD-10-CM | POA: Diagnosis not present

## 2017-01-14 DIAGNOSIS — E119 Type 2 diabetes mellitus without complications: Secondary | ICD-10-CM | POA: Diagnosis not present

## 2017-01-14 DIAGNOSIS — M6281 Muscle weakness (generalized): Secondary | ICD-10-CM | POA: Diagnosis not present

## 2017-01-15 DIAGNOSIS — R1310 Dysphagia, unspecified: Secondary | ICD-10-CM | POA: Diagnosis not present

## 2017-01-15 DIAGNOSIS — M6281 Muscle weakness (generalized): Secondary | ICD-10-CM | POA: Diagnosis not present

## 2017-01-15 DIAGNOSIS — E119 Type 2 diabetes mellitus without complications: Secondary | ICD-10-CM | POA: Diagnosis not present

## 2017-01-16 DIAGNOSIS — E46 Unspecified protein-calorie malnutrition: Secondary | ICD-10-CM | POA: Diagnosis not present

## 2017-01-16 DIAGNOSIS — M6281 Muscle weakness (generalized): Secondary | ICD-10-CM | POA: Diagnosis not present

## 2017-01-16 DIAGNOSIS — N390 Urinary tract infection, site not specified: Secondary | ICD-10-CM | POA: Diagnosis not present

## 2017-01-16 DIAGNOSIS — E119 Type 2 diabetes mellitus without complications: Secondary | ICD-10-CM | POA: Diagnosis not present

## 2017-01-16 DIAGNOSIS — R1310 Dysphagia, unspecified: Secondary | ICD-10-CM | POA: Diagnosis not present

## 2017-01-16 DIAGNOSIS — R627 Adult failure to thrive: Secondary | ICD-10-CM | POA: Diagnosis not present

## 2017-01-17 DIAGNOSIS — M79641 Pain in right hand: Secondary | ICD-10-CM | POA: Diagnosis not present

## 2017-01-17 DIAGNOSIS — R627 Adult failure to thrive: Secondary | ICD-10-CM | POA: Diagnosis not present

## 2017-01-17 DIAGNOSIS — M6281 Muscle weakness (generalized): Secondary | ICD-10-CM | POA: Diagnosis not present

## 2017-01-17 DIAGNOSIS — E119 Type 2 diabetes mellitus without complications: Secondary | ICD-10-CM | POA: Diagnosis not present

## 2017-01-17 DIAGNOSIS — R1310 Dysphagia, unspecified: Secondary | ICD-10-CM | POA: Diagnosis not present

## 2017-01-17 DIAGNOSIS — E46 Unspecified protein-calorie malnutrition: Secondary | ICD-10-CM | POA: Diagnosis not present

## 2017-01-17 DIAGNOSIS — N390 Urinary tract infection, site not specified: Secondary | ICD-10-CM | POA: Diagnosis not present

## 2017-01-18 DIAGNOSIS — E119 Type 2 diabetes mellitus without complications: Secondary | ICD-10-CM | POA: Diagnosis not present

## 2017-01-18 DIAGNOSIS — M6281 Muscle weakness (generalized): Secondary | ICD-10-CM | POA: Diagnosis not present

## 2017-01-18 DIAGNOSIS — R1310 Dysphagia, unspecified: Secondary | ICD-10-CM | POA: Diagnosis not present

## 2017-01-19 DIAGNOSIS — M6281 Muscle weakness (generalized): Secondary | ICD-10-CM | POA: Diagnosis not present

## 2017-01-19 DIAGNOSIS — R1310 Dysphagia, unspecified: Secondary | ICD-10-CM | POA: Diagnosis not present

## 2017-01-19 DIAGNOSIS — E119 Type 2 diabetes mellitus without complications: Secondary | ICD-10-CM | POA: Diagnosis not present

## 2017-01-20 DIAGNOSIS — M6281 Muscle weakness (generalized): Secondary | ICD-10-CM | POA: Diagnosis not present

## 2017-01-20 DIAGNOSIS — E119 Type 2 diabetes mellitus without complications: Secondary | ICD-10-CM | POA: Diagnosis not present

## 2017-01-20 DIAGNOSIS — R1310 Dysphagia, unspecified: Secondary | ICD-10-CM | POA: Diagnosis not present

## 2017-01-21 ENCOUNTER — Other Ambulatory Visit: Payer: Self-pay

## 2017-01-21 DIAGNOSIS — R1310 Dysphagia, unspecified: Secondary | ICD-10-CM | POA: Diagnosis not present

## 2017-01-21 DIAGNOSIS — M6281 Muscle weakness (generalized): Secondary | ICD-10-CM | POA: Diagnosis not present

## 2017-01-21 DIAGNOSIS — E119 Type 2 diabetes mellitus without complications: Secondary | ICD-10-CM | POA: Diagnosis not present

## 2017-01-22 DIAGNOSIS — M6281 Muscle weakness (generalized): Secondary | ICD-10-CM | POA: Diagnosis not present

## 2017-01-22 DIAGNOSIS — E119 Type 2 diabetes mellitus without complications: Secondary | ICD-10-CM | POA: Diagnosis not present

## 2017-01-22 DIAGNOSIS — R1310 Dysphagia, unspecified: Secondary | ICD-10-CM | POA: Diagnosis not present

## 2017-01-23 DIAGNOSIS — M6281 Muscle weakness (generalized): Secondary | ICD-10-CM | POA: Diagnosis not present

## 2017-01-23 DIAGNOSIS — R1310 Dysphagia, unspecified: Secondary | ICD-10-CM | POA: Diagnosis not present

## 2017-01-23 DIAGNOSIS — E119 Type 2 diabetes mellitus without complications: Secondary | ICD-10-CM | POA: Diagnosis not present

## 2017-01-24 DIAGNOSIS — R1310 Dysphagia, unspecified: Secondary | ICD-10-CM | POA: Diagnosis not present

## 2017-01-24 DIAGNOSIS — M6281 Muscle weakness (generalized): Secondary | ICD-10-CM | POA: Diagnosis not present

## 2017-01-24 DIAGNOSIS — E119 Type 2 diabetes mellitus without complications: Secondary | ICD-10-CM | POA: Diagnosis not present

## 2017-01-26 DIAGNOSIS — M6281 Muscle weakness (generalized): Secondary | ICD-10-CM | POA: Diagnosis not present

## 2017-01-26 DIAGNOSIS — R1310 Dysphagia, unspecified: Secondary | ICD-10-CM | POA: Diagnosis not present

## 2017-01-26 DIAGNOSIS — E119 Type 2 diabetes mellitus without complications: Secondary | ICD-10-CM | POA: Diagnosis not present

## 2017-01-27 DIAGNOSIS — E119 Type 2 diabetes mellitus without complications: Secondary | ICD-10-CM | POA: Diagnosis not present

## 2017-01-27 DIAGNOSIS — M6281 Muscle weakness (generalized): Secondary | ICD-10-CM | POA: Diagnosis not present

## 2017-01-27 DIAGNOSIS — R1310 Dysphagia, unspecified: Secondary | ICD-10-CM | POA: Diagnosis not present

## 2017-01-28 DIAGNOSIS — M6281 Muscle weakness (generalized): Secondary | ICD-10-CM | POA: Diagnosis not present

## 2017-01-28 DIAGNOSIS — R1310 Dysphagia, unspecified: Secondary | ICD-10-CM | POA: Diagnosis not present

## 2017-01-28 DIAGNOSIS — E119 Type 2 diabetes mellitus without complications: Secondary | ICD-10-CM | POA: Diagnosis not present

## 2017-01-29 DIAGNOSIS — R1310 Dysphagia, unspecified: Secondary | ICD-10-CM | POA: Diagnosis not present

## 2017-01-29 DIAGNOSIS — E119 Type 2 diabetes mellitus without complications: Secondary | ICD-10-CM | POA: Diagnosis not present

## 2017-01-29 DIAGNOSIS — M6281 Muscle weakness (generalized): Secondary | ICD-10-CM | POA: Diagnosis not present

## 2017-01-30 DIAGNOSIS — R1310 Dysphagia, unspecified: Secondary | ICD-10-CM | POA: Diagnosis not present

## 2017-01-30 DIAGNOSIS — M6281 Muscle weakness (generalized): Secondary | ICD-10-CM | POA: Diagnosis not present

## 2017-01-30 DIAGNOSIS — E119 Type 2 diabetes mellitus without complications: Secondary | ICD-10-CM | POA: Diagnosis not present

## 2017-01-31 DIAGNOSIS — M6281 Muscle weakness (generalized): Secondary | ICD-10-CM | POA: Diagnosis not present

## 2017-01-31 DIAGNOSIS — R1310 Dysphagia, unspecified: Secondary | ICD-10-CM | POA: Diagnosis not present

## 2017-01-31 DIAGNOSIS — E119 Type 2 diabetes mellitus without complications: Secondary | ICD-10-CM | POA: Diagnosis not present

## 2017-02-02 DIAGNOSIS — R1310 Dysphagia, unspecified: Secondary | ICD-10-CM | POA: Diagnosis not present

## 2017-02-02 DIAGNOSIS — E119 Type 2 diabetes mellitus without complications: Secondary | ICD-10-CM | POA: Diagnosis not present

## 2017-02-02 DIAGNOSIS — M6281 Muscle weakness (generalized): Secondary | ICD-10-CM | POA: Diagnosis not present

## 2017-02-03 DIAGNOSIS — E119 Type 2 diabetes mellitus without complications: Secondary | ICD-10-CM | POA: Diagnosis not present

## 2017-02-03 DIAGNOSIS — M6281 Muscle weakness (generalized): Secondary | ICD-10-CM | POA: Diagnosis not present

## 2017-02-03 DIAGNOSIS — R1310 Dysphagia, unspecified: Secondary | ICD-10-CM | POA: Diagnosis not present

## 2017-02-11 DIAGNOSIS — E1151 Type 2 diabetes mellitus with diabetic peripheral angiopathy without gangrene: Secondary | ICD-10-CM | POA: Diagnosis not present

## 2017-02-11 DIAGNOSIS — L84 Corns and callosities: Secondary | ICD-10-CM | POA: Diagnosis not present

## 2017-03-07 DIAGNOSIS — M6281 Muscle weakness (generalized): Secondary | ICD-10-CM | POA: Diagnosis not present

## 2017-03-07 DIAGNOSIS — S42302D Unspecified fracture of shaft of humerus, left arm, subsequent encounter for fracture with routine healing: Secondary | ICD-10-CM | POA: Diagnosis not present

## 2017-03-07 DIAGNOSIS — R1310 Dysphagia, unspecified: Secondary | ICD-10-CM | POA: Diagnosis not present

## 2017-03-07 DIAGNOSIS — E119 Type 2 diabetes mellitus without complications: Secondary | ICD-10-CM | POA: Diagnosis not present

## 2017-03-10 DIAGNOSIS — R1310 Dysphagia, unspecified: Secondary | ICD-10-CM | POA: Diagnosis not present

## 2017-03-10 DIAGNOSIS — H04123 Dry eye syndrome of bilateral lacrimal glands: Secondary | ICD-10-CM | POA: Diagnosis not present

## 2017-03-10 DIAGNOSIS — M6281 Muscle weakness (generalized): Secondary | ICD-10-CM | POA: Diagnosis not present

## 2017-03-10 DIAGNOSIS — H44522 Atrophy of globe, left eye: Secondary | ICD-10-CM | POA: Diagnosis not present

## 2017-03-10 DIAGNOSIS — H2513 Age-related nuclear cataract, bilateral: Secondary | ICD-10-CM | POA: Diagnosis not present

## 2017-03-10 DIAGNOSIS — E119 Type 2 diabetes mellitus without complications: Secondary | ICD-10-CM | POA: Diagnosis not present

## 2017-03-10 DIAGNOSIS — H40021 Open angle with borderline findings, high risk, right eye: Secondary | ICD-10-CM | POA: Diagnosis not present

## 2017-03-10 DIAGNOSIS — S42302D Unspecified fracture of shaft of humerus, left arm, subsequent encounter for fracture with routine healing: Secondary | ICD-10-CM | POA: Diagnosis not present

## 2017-03-11 DIAGNOSIS — E785 Hyperlipidemia, unspecified: Secondary | ICD-10-CM | POA: Diagnosis not present

## 2017-03-11 DIAGNOSIS — R1310 Dysphagia, unspecified: Secondary | ICD-10-CM | POA: Diagnosis not present

## 2017-03-11 DIAGNOSIS — E119 Type 2 diabetes mellitus without complications: Secondary | ICD-10-CM | POA: Diagnosis not present

## 2017-03-11 DIAGNOSIS — S42302D Unspecified fracture of shaft of humerus, left arm, subsequent encounter for fracture with routine healing: Secondary | ICD-10-CM | POA: Diagnosis not present

## 2017-03-11 DIAGNOSIS — Z79899 Other long term (current) drug therapy: Secondary | ICD-10-CM | POA: Diagnosis not present

## 2017-03-11 DIAGNOSIS — D649 Anemia, unspecified: Secondary | ICD-10-CM | POA: Diagnosis not present

## 2017-03-11 DIAGNOSIS — R6889 Other general symptoms and signs: Secondary | ICD-10-CM | POA: Diagnosis not present

## 2017-03-11 DIAGNOSIS — E039 Hypothyroidism, unspecified: Secondary | ICD-10-CM | POA: Diagnosis not present

## 2017-03-11 DIAGNOSIS — M6281 Muscle weakness (generalized): Secondary | ICD-10-CM | POA: Diagnosis not present

## 2017-03-12 DIAGNOSIS — R1310 Dysphagia, unspecified: Secondary | ICD-10-CM | POA: Diagnosis not present

## 2017-03-12 DIAGNOSIS — S42302D Unspecified fracture of shaft of humerus, left arm, subsequent encounter for fracture with routine healing: Secondary | ICD-10-CM | POA: Diagnosis not present

## 2017-03-12 DIAGNOSIS — M6281 Muscle weakness (generalized): Secondary | ICD-10-CM | POA: Diagnosis not present

## 2017-03-12 DIAGNOSIS — E119 Type 2 diabetes mellitus without complications: Secondary | ICD-10-CM | POA: Diagnosis not present

## 2017-03-13 DIAGNOSIS — R1310 Dysphagia, unspecified: Secondary | ICD-10-CM | POA: Diagnosis not present

## 2017-03-13 DIAGNOSIS — E119 Type 2 diabetes mellitus without complications: Secondary | ICD-10-CM | POA: Diagnosis not present

## 2017-03-13 DIAGNOSIS — M6281 Muscle weakness (generalized): Secondary | ICD-10-CM | POA: Diagnosis not present

## 2017-03-13 DIAGNOSIS — S42302D Unspecified fracture of shaft of humerus, left arm, subsequent encounter for fracture with routine healing: Secondary | ICD-10-CM | POA: Diagnosis not present

## 2017-03-14 DIAGNOSIS — E119 Type 2 diabetes mellitus without complications: Secondary | ICD-10-CM | POA: Diagnosis not present

## 2017-03-14 DIAGNOSIS — M6281 Muscle weakness (generalized): Secondary | ICD-10-CM | POA: Diagnosis not present

## 2017-03-14 DIAGNOSIS — S42302D Unspecified fracture of shaft of humerus, left arm, subsequent encounter for fracture with routine healing: Secondary | ICD-10-CM | POA: Diagnosis not present

## 2017-03-14 DIAGNOSIS — R1310 Dysphagia, unspecified: Secondary | ICD-10-CM | POA: Diagnosis not present

## 2017-03-17 DIAGNOSIS — E119 Type 2 diabetes mellitus without complications: Secondary | ICD-10-CM | POA: Diagnosis not present

## 2017-03-17 DIAGNOSIS — R1310 Dysphagia, unspecified: Secondary | ICD-10-CM | POA: Diagnosis not present

## 2017-03-17 DIAGNOSIS — S42302D Unspecified fracture of shaft of humerus, left arm, subsequent encounter for fracture with routine healing: Secondary | ICD-10-CM | POA: Diagnosis not present

## 2017-03-17 DIAGNOSIS — M6281 Muscle weakness (generalized): Secondary | ICD-10-CM | POA: Diagnosis not present

## 2017-03-18 DIAGNOSIS — M6281 Muscle weakness (generalized): Secondary | ICD-10-CM | POA: Diagnosis not present

## 2017-03-18 DIAGNOSIS — E119 Type 2 diabetes mellitus without complications: Secondary | ICD-10-CM | POA: Diagnosis not present

## 2017-03-18 DIAGNOSIS — R1310 Dysphagia, unspecified: Secondary | ICD-10-CM | POA: Diagnosis not present

## 2017-03-18 DIAGNOSIS — S42302D Unspecified fracture of shaft of humerus, left arm, subsequent encounter for fracture with routine healing: Secondary | ICD-10-CM | POA: Diagnosis not present

## 2017-03-19 DIAGNOSIS — M6281 Muscle weakness (generalized): Secondary | ICD-10-CM | POA: Diagnosis not present

## 2017-03-19 DIAGNOSIS — E119 Type 2 diabetes mellitus without complications: Secondary | ICD-10-CM | POA: Diagnosis not present

## 2017-03-19 DIAGNOSIS — R1310 Dysphagia, unspecified: Secondary | ICD-10-CM | POA: Diagnosis not present

## 2017-03-19 DIAGNOSIS — S42302D Unspecified fracture of shaft of humerus, left arm, subsequent encounter for fracture with routine healing: Secondary | ICD-10-CM | POA: Diagnosis not present

## 2017-03-20 DIAGNOSIS — M6281 Muscle weakness (generalized): Secondary | ICD-10-CM | POA: Diagnosis not present

## 2017-03-20 DIAGNOSIS — S42302D Unspecified fracture of shaft of humerus, left arm, subsequent encounter for fracture with routine healing: Secondary | ICD-10-CM | POA: Diagnosis not present

## 2017-03-20 DIAGNOSIS — R1310 Dysphagia, unspecified: Secondary | ICD-10-CM | POA: Diagnosis not present

## 2017-03-20 DIAGNOSIS — E119 Type 2 diabetes mellitus without complications: Secondary | ICD-10-CM | POA: Diagnosis not present

## 2017-03-21 DIAGNOSIS — M6281 Muscle weakness (generalized): Secondary | ICD-10-CM | POA: Diagnosis not present

## 2017-03-21 DIAGNOSIS — R1310 Dysphagia, unspecified: Secondary | ICD-10-CM | POA: Diagnosis not present

## 2017-03-21 DIAGNOSIS — E119 Type 2 diabetes mellitus without complications: Secondary | ICD-10-CM | POA: Diagnosis not present

## 2017-03-21 DIAGNOSIS — S42302D Unspecified fracture of shaft of humerus, left arm, subsequent encounter for fracture with routine healing: Secondary | ICD-10-CM | POA: Diagnosis not present

## 2017-03-24 DIAGNOSIS — M6281 Muscle weakness (generalized): Secondary | ICD-10-CM | POA: Diagnosis not present

## 2017-03-24 DIAGNOSIS — R1310 Dysphagia, unspecified: Secondary | ICD-10-CM | POA: Diagnosis not present

## 2017-03-24 DIAGNOSIS — S42302D Unspecified fracture of shaft of humerus, left arm, subsequent encounter for fracture with routine healing: Secondary | ICD-10-CM | POA: Diagnosis not present

## 2017-03-24 DIAGNOSIS — E119 Type 2 diabetes mellitus without complications: Secondary | ICD-10-CM | POA: Diagnosis not present

## 2017-03-25 DIAGNOSIS — S42302D Unspecified fracture of shaft of humerus, left arm, subsequent encounter for fracture with routine healing: Secondary | ICD-10-CM | POA: Diagnosis not present

## 2017-03-25 DIAGNOSIS — E119 Type 2 diabetes mellitus without complications: Secondary | ICD-10-CM | POA: Diagnosis not present

## 2017-03-25 DIAGNOSIS — M6281 Muscle weakness (generalized): Secondary | ICD-10-CM | POA: Diagnosis not present

## 2017-03-25 DIAGNOSIS — R1310 Dysphagia, unspecified: Secondary | ICD-10-CM | POA: Diagnosis not present

## 2017-03-26 DIAGNOSIS — R1310 Dysphagia, unspecified: Secondary | ICD-10-CM | POA: Diagnosis not present

## 2017-03-26 DIAGNOSIS — S42302D Unspecified fracture of shaft of humerus, left arm, subsequent encounter for fracture with routine healing: Secondary | ICD-10-CM | POA: Diagnosis not present

## 2017-03-26 DIAGNOSIS — M6281 Muscle weakness (generalized): Secondary | ICD-10-CM | POA: Diagnosis not present

## 2017-03-26 DIAGNOSIS — E119 Type 2 diabetes mellitus without complications: Secondary | ICD-10-CM | POA: Diagnosis not present

## 2017-03-27 DIAGNOSIS — M6281 Muscle weakness (generalized): Secondary | ICD-10-CM | POA: Diagnosis not present

## 2017-03-27 DIAGNOSIS — R1310 Dysphagia, unspecified: Secondary | ICD-10-CM | POA: Diagnosis not present

## 2017-03-27 DIAGNOSIS — E119 Type 2 diabetes mellitus without complications: Secondary | ICD-10-CM | POA: Diagnosis not present

## 2017-03-27 DIAGNOSIS — S42302D Unspecified fracture of shaft of humerus, left arm, subsequent encounter for fracture with routine healing: Secondary | ICD-10-CM | POA: Diagnosis not present

## 2017-03-28 DIAGNOSIS — S42302D Unspecified fracture of shaft of humerus, left arm, subsequent encounter for fracture with routine healing: Secondary | ICD-10-CM | POA: Diagnosis not present

## 2017-03-28 DIAGNOSIS — R1310 Dysphagia, unspecified: Secondary | ICD-10-CM | POA: Diagnosis not present

## 2017-03-28 DIAGNOSIS — E119 Type 2 diabetes mellitus without complications: Secondary | ICD-10-CM | POA: Diagnosis not present

## 2017-03-28 DIAGNOSIS — M6281 Muscle weakness (generalized): Secondary | ICD-10-CM | POA: Diagnosis not present

## 2017-03-31 DIAGNOSIS — R1310 Dysphagia, unspecified: Secondary | ICD-10-CM | POA: Diagnosis not present

## 2017-03-31 DIAGNOSIS — M6281 Muscle weakness (generalized): Secondary | ICD-10-CM | POA: Diagnosis not present

## 2017-03-31 DIAGNOSIS — S42302D Unspecified fracture of shaft of humerus, left arm, subsequent encounter for fracture with routine healing: Secondary | ICD-10-CM | POA: Diagnosis not present

## 2017-03-31 DIAGNOSIS — E119 Type 2 diabetes mellitus without complications: Secondary | ICD-10-CM | POA: Diagnosis not present

## 2017-04-01 DIAGNOSIS — R1310 Dysphagia, unspecified: Secondary | ICD-10-CM | POA: Diagnosis not present

## 2017-04-01 DIAGNOSIS — S42302D Unspecified fracture of shaft of humerus, left arm, subsequent encounter for fracture with routine healing: Secondary | ICD-10-CM | POA: Diagnosis not present

## 2017-04-01 DIAGNOSIS — M6281 Muscle weakness (generalized): Secondary | ICD-10-CM | POA: Diagnosis not present

## 2017-04-01 DIAGNOSIS — E119 Type 2 diabetes mellitus without complications: Secondary | ICD-10-CM | POA: Diagnosis not present

## 2017-05-01 DIAGNOSIS — L84 Corns and callosities: Secondary | ICD-10-CM | POA: Diagnosis not present

## 2017-05-01 DIAGNOSIS — E1151 Type 2 diabetes mellitus with diabetic peripheral angiopathy without gangrene: Secondary | ICD-10-CM | POA: Diagnosis not present

## 2017-07-03 DIAGNOSIS — E785 Hyperlipidemia, unspecified: Secondary | ICD-10-CM | POA: Diagnosis not present

## 2017-07-03 DIAGNOSIS — M109 Gout, unspecified: Secondary | ICD-10-CM | POA: Diagnosis not present

## 2017-07-08 ENCOUNTER — Emergency Department (HOSPITAL_COMMUNITY)
Admission: EM | Admit: 2017-07-08 | Discharge: 2017-07-08 | Disposition: A | Discharge status: Home / Self Care | Payer: Medicare Other | Attending: Emergency Medicine | Admitting: Emergency Medicine

## 2017-07-08 ENCOUNTER — Encounter (HOSPITAL_COMMUNITY): Payer: Self-pay

## 2017-07-08 ENCOUNTER — Emergency Department (HOSPITAL_COMMUNITY): Payer: Medicare Other

## 2017-07-08 DIAGNOSIS — R279 Unspecified lack of coordination: Secondary | ICD-10-CM | POA: Diagnosis not present

## 2017-07-08 DIAGNOSIS — N183 Chronic kidney disease, stage 3 (moderate): Secondary | ICD-10-CM | POA: Insufficient documentation

## 2017-07-08 DIAGNOSIS — Y999 Unspecified external cause status: Secondary | ICD-10-CM | POA: Diagnosis not present

## 2017-07-08 DIAGNOSIS — I129 Hypertensive chronic kidney disease with stage 1 through stage 4 chronic kidney disease, or unspecified chronic kidney disease: Secondary | ICD-10-CM | POA: Diagnosis not present

## 2017-07-08 DIAGNOSIS — Y929 Unspecified place or not applicable: Secondary | ICD-10-CM | POA: Insufficient documentation

## 2017-07-08 DIAGNOSIS — F0390 Unspecified dementia without behavioral disturbance: Secondary | ICD-10-CM | POA: Insufficient documentation

## 2017-07-08 DIAGNOSIS — X58XXXA Exposure to other specified factors, initial encounter: Secondary | ICD-10-CM | POA: Diagnosis not present

## 2017-07-08 DIAGNOSIS — Y9389 Activity, other specified: Secondary | ICD-10-CM | POA: Insufficient documentation

## 2017-07-08 DIAGNOSIS — E119 Type 2 diabetes mellitus without complications: Secondary | ICD-10-CM | POA: Diagnosis not present

## 2017-07-08 DIAGNOSIS — S42302A Unspecified fracture of shaft of humerus, left arm, initial encounter for closed fracture: Secondary | ICD-10-CM | POA: Diagnosis not present

## 2017-07-08 DIAGNOSIS — S4992XA Unspecified injury of left shoulder and upper arm, initial encounter: Secondary | ICD-10-CM | POA: Diagnosis present

## 2017-07-08 DIAGNOSIS — M79622 Pain in left upper arm: Secondary | ICD-10-CM | POA: Diagnosis not present

## 2017-07-08 DIAGNOSIS — R404 Transient alteration of awareness: Secondary | ICD-10-CM | POA: Diagnosis not present

## 2017-07-08 DIAGNOSIS — Z79899 Other long term (current) drug therapy: Secondary | ICD-10-CM | POA: Insufficient documentation

## 2017-07-08 DIAGNOSIS — R531 Weakness: Secondary | ICD-10-CM | POA: Diagnosis not present

## 2017-07-08 DIAGNOSIS — N289 Disorder of kidney and ureter, unspecified: Secondary | ICD-10-CM | POA: Diagnosis not present

## 2017-07-08 DIAGNOSIS — S42202A Unspecified fracture of upper end of left humerus, initial encounter for closed fracture: Secondary | ICD-10-CM | POA: Diagnosis not present

## 2017-07-08 DIAGNOSIS — D649 Anemia, unspecified: Secondary | ICD-10-CM | POA: Diagnosis not present

## 2017-07-08 DIAGNOSIS — Z794 Long term (current) use of insulin: Secondary | ICD-10-CM | POA: Diagnosis not present

## 2017-07-08 DIAGNOSIS — Z7401 Bed confinement status: Secondary | ICD-10-CM | POA: Diagnosis not present

## 2017-07-08 DIAGNOSIS — M79601 Pain in right arm: Secondary | ICD-10-CM | POA: Diagnosis not present

## 2017-07-08 HISTORY — DX: Repeated falls: R29.6

## 2017-07-08 HISTORY — DX: Disorder of thyroid, unspecified: E07.9

## 2017-07-08 HISTORY — DX: Rheumatoid arthritis, unspecified: M06.9

## 2017-07-08 HISTORY — DX: Urinary tract infection, site not specified: N39.0

## 2017-07-08 HISTORY — DX: Dorsalgia, unspecified: M54.9

## 2017-07-08 HISTORY — DX: Dysphagia, unspecified: R13.10

## 2017-07-08 HISTORY — DX: Failure to thrive (child): R62.51

## 2017-07-08 HISTORY — DX: Iron deficiency anemia, unspecified: D50.9

## 2017-07-08 HISTORY — DX: Unspecified fall, initial encounter: W19.XXXA

## 2017-07-08 HISTORY — DX: Dehydration: E86.0

## 2017-07-08 LAB — CBC WITH DIFFERENTIAL/PLATELET
BASOS PCT: 0 %
Basophils Absolute: 0 10*3/uL (ref 0.0–0.1)
Eosinophils Absolute: 0.1 10*3/uL (ref 0.0–0.7)
Eosinophils Relative: 1 %
HEMATOCRIT: 32.4 % — AB (ref 36.0–46.0)
HEMOGLOBIN: 10.5 g/dL — AB (ref 12.0–15.0)
LYMPHS PCT: 44 %
Lymphs Abs: 5.6 10*3/uL — ABNORMAL HIGH (ref 0.7–4.0)
MCH: 27.1 pg (ref 26.0–34.0)
MCHC: 32.4 g/dL (ref 30.0–36.0)
MCV: 83.5 fL (ref 78.0–100.0)
Monocytes Absolute: 0.7 10*3/uL (ref 0.1–1.0)
Monocytes Relative: 5 %
NEUTROS ABS: 6.5 10*3/uL (ref 1.7–7.7)
NEUTROS PCT: 50 %
Platelets: 289 10*3/uL (ref 150–400)
RBC: 3.88 MIL/uL (ref 3.87–5.11)
RDW: 15.3 % (ref 11.5–15.5)
WBC: 12.9 10*3/uL — ABNORMAL HIGH (ref 4.0–10.5)

## 2017-07-08 LAB — BASIC METABOLIC PANEL
Anion gap: 12 (ref 5–15)
BUN: 33 mg/dL — ABNORMAL HIGH (ref 6–20)
CHLORIDE: 112 mmol/L — AB (ref 101–111)
CO2: 21 mmol/L — AB (ref 22–32)
Calcium: 9.6 mg/dL (ref 8.9–10.3)
Creatinine, Ser: 2.62 mg/dL — ABNORMAL HIGH (ref 0.44–1.00)
GFR calc non Af Amer: 17 mL/min — ABNORMAL LOW (ref >60)
GFR, EST AFRICAN AMERICAN: 20 mL/min — AB (ref >60)
Glucose, Bld: 156 mg/dL — ABNORMAL HIGH (ref 65–99)
Potassium: 4.2 mmol/L (ref 3.5–5.1)
Sodium: 145 mmol/L (ref 135–145)

## 2017-07-08 MED ORDER — HYDROCODONE-ACETAMINOPHEN 5-325 MG PO TABS: 1.0000 | ORAL_TABLET | Freq: Four times a day (QID) | ORAL | 0 refills | Status: DC | PRN

## 2017-07-08 MED ORDER — FENTANYL CITRATE (PF) 100 MCG/2ML IJ SOLN
100.0000 ug | Freq: Once | INTRAMUSCULAR | Status: AC
  Administered 2017-07-08: 100 ug via INTRAVENOUS
  Filled 2017-07-08: qty 2

## 2017-07-08 NOTE — ED Provider Notes (Signed)
[facility] Provider Note   CSN: [id number] Arrival date & time: 07/08/17  1235   Level V caveat dementia. History is obtained from Ms Caitlin Danker, LPN skilled nursing facility, from patient's brother who accompanies her and from records comparing patient.  History   Chief Complaint Chief Complaint  Patient presents with  . Arm Pain    HPI Caitlin Riddle is a 76 y.o. female.she was noted to be holding her left arm in pain this morning. It is unknown when or if she had an injury.Marland Kitchenshe was treated with Norco 5 mg 10:30 AM today .she reportedly had x-ray of left arm this morning as outpatient, which showed fracture or dislocation of left arm. X-rays not available to me  HPI  Past Medical History:  Diagnosis Date  . Alzheimer disease   . Arthralgia   . Arthritis   . Back pain   . CKD (chronic kidney disease)    stage II  . Dehydration   . Dementia   . Diabetes mellitus   . Dysphagia   . Failure to thrive (0-17)   . Falls   . Hyperlipidemia   . Hypertension   . Iron deficiency anemia   . Renal insufficiency   . Rheumatoid arthritis (HCC)   . Thyroid disease   . UTI (urinary tract infection)   . Vision loss of left eye     Patient Active Problem List   Diagnosis Date Noted  . Intertrochanteric fracture of right femur (HCC) 01/11/2015  . Fall   . Closed right hip fracture (HCC) 01/10/2015  . CKD (chronic kidney disease) stage 3, GFR 30-59 ml/min (HCC) 01/10/2015  . Osteoporosis 09/14/2014  . Hyperlipidemia 07/28/2014  . Pressure ulcer of right ankle 11/29/2013  . Hypothyroidism 10/17/2013  . Weakness of right leg 09/16/2013  . Osteoarthritis of right knee 09/16/2013  . Alzheimer's disease 08/19/2013  . Type 2 diabetes mellitus with HbA1C goal below 7.5 07/14/2013  . Loss of weight 07/14/2013  . UTI (lower urinary tract infection) 12/11/2011  . Arthritis of knee, right 12/11/2011  . Fever 12/11/2011  . Leukocytosis 12/11/2011  . Rheumatoid arthritis  (HCC) 12/11/2011  . Iron deficiency anemia 05/22/2009  . HELICOBACTER PYLORI GASTRITIS 05/17/2009  . COLONOSCOPY, HX OF 04/20/2009  . KNEE PAIN 05/03/2008    Past Surgical History:  Procedure Laterality Date  . COLONOSCOPY    . ESOPHAGOGASTRODUODENOSCOPY    . INTRAMEDULLARY (IM) NAIL INTERTROCHANTERIC Right 01/11/2015   Procedure: INTERNAL FIXATION RIGHT HIP;  Surgeon: Vickki Hearing, MD;  Location: [facility];  Service: Orthopedics;  Laterality: Right;  . TOTAL ABDOMINAL HYSTERECTOMY      OB History    No data available       Home Medications    Prior to Admission medications   Medication Sig Start Date End Date Taking? Authorizing Provider  Amino Acids-Protein Hydrolys (FEEDING SUPPLEMENT, PRO-STAT SUGAR FREE 64,) LIQD Take 30 mLs by mouth 2 (two) times daily.    [provider]  cefTRIAXone (ROCEPHIN) 1 G injection Inject 1 g into the muscle daily. Course to end on 07/14/2015    [provider]  Cholecalciferol (VITAMIN D3) 5000 UNITS CAPS Take 5,000 Units by mouth daily.    [provider]  HYDROcodone-acetaminophen (NORCO/VICODIN) 5-325 MG per tablet Take 1-2 tablets by mouth every 6 (six) hours as needed for moderate pain. 01/13/15   Black, Lesle Chris, NP  insulin aspart (NOVOLOG) 100 UNIT/ML injection Inject 1-6 Units into the skin 3 (three)  times daily before meals. 150-200= 1 unit 201-250= 2 unit 251-300= 3 unit 301-350= 4 unit 351-400= 6 unit  Over 400= call MD    [provider]  levothyroxine (SYNTHROID, LEVOTHROID) 25 MCG tablet Take 1 tablet (25 mcg total) by mouth daily before breakfast. 12/28/13   Gerda Diss, Jonna Coup, MD  memantine (NAMENDA) 5 MG tablet Take 1 tablet (5 mg total) by mouth 2 (two) times daily. 11/29/13   Babs Sciara, MD  metoprolol succinate (TOPROL-XL) 25 MG 24 hr tablet Take 0.5 tablets (12.5 mg total) by mouth daily. 01/13/15   Black, Lesle Chris, NP  ondansetron (ZOFRAN) 4 MG tablet Take 4 mg by mouth every 6 (six) hours  as needed for nausea or vomiting.    [provider]  pravastatin (PRAVACHOL) 20 MG tablet TAKE 1 TABLET BY MOUTH ONCE DAILY. 10/25/14   Merlyn Albert, MD  Propylene Glycol (SYSTANE BALANCE OP) Apply 1 drop to eye 3 (three) times daily.    [provider]  traMADol (ULTRAM) 50 MG tablet Take 1 tablet (50 mg total) by mouth 2 (two) times daily. 01/13/15   Black, Lesle Chris, NP    Family History No family history on file.  Social History Social History  Substance Use Topics  . Smoking status: Never Smoker  . Smokeless tobacco: Never Used  . Alcohol use No     Allergies   Patient has no known allergies.   Review of Systems Review of Systems  Unable to perform ROS: Dementia  Eyes: Positive for visual disturbance.       Blind in left eye  Musculoskeletal: Positive for arthralgias.       Left arm pain     Physical Exam Updated Vital Signs BP 106/63   Pulse (!) 103   Temp 98.1 F (36.7 C) (Oral)   Resp 18   SpO2 100%   Physical Exam  Constitutional:  Chronically ill-appearing  HENT:  Head: Normocephalic and atraumatic.  Eyes: Pupils are equal, round, and reactive to light. Conjunctivae are normal.  Left cornea opacified  Neck: Neck supple. No tracheal deviation present. No thyromegaly present.  Cardiovascular: Normal rate and regular rhythm.   No murmur heard. Pulmonary/Chest: Effort normal and breath sounds normal.  Abdominal: Soft. Bowel sounds are normal. She exhibits no distension. There is no tenderness.  Musculoskeletal: Normal range of motion. She exhibits no edema or tenderness.  Entire spine nontender. Pelvis stable nontender. Left upper extremity skin intact. Tender at upper armwith swelling. Radial pulse 2+. Good capillary refill. All other extremities no contusion abrasion or tenderness neurovascularly intact  Neurological: She is alert. Coordination normal.  Skin: Skin is warm and dry. No rash noted.  Psychiatric: She has a normal mood  and affect.  Nursing note and vitals reviewed.    ED Treatments / Results  Labs (all labs ordered are listed, but only abnormal results are displayed) Labs Reviewed  CBC WITH DIFFERENTIAL/PLATELET  BASIC METABOLIC PANEL    EKG  EKG Interpretation None      Results for orders placed or performed during the hospital encounter of 07/08/17  CBC with Differential/Platelet  Result Value Ref Range   WBC 12.9 (H) 4.0 - 10.5 K/uL   RBC 3.88 3.87 - 5.11 MIL/uL   Hemoglobin 10.5 (L) 12.0 - 15.0 g/dL   HCT 64.6 (L) 80.3 - 21.2 %   MCV 83.5 78.0 - 100.0 fL   MCH 27.1 26.0 - 34.0 pg   MCHC 32.4 30.0 -  36.0 g/dL   RDW 32.3 55.7 - 32.2 %   Platelets 289 150 - 400 K/uL   Neutrophils Relative % 50 %   Neutro Abs 6.5 1.7 - 7.7 K/uL   Lymphocytes Relative 44 %   Lymphs Abs 5.6 (H) 0.7 - 4.0 K/uL   Monocytes Relative 5 %   Monocytes Absolute 0.7 0.1 - 1.0 K/uL   Eosinophils Relative 1 %   Eosinophils Absolute 0.1 0.0 - 0.7 K/uL   Basophils Relative 0 %   Basophils Absolute 0.0 0.0 - 0.1 K/uL  Basic metabolic panel  Result Value Ref Range   Sodium 145 135 - 145 mmol/L   Potassium 4.2 3.5 - 5.1 mmol/L   Chloride 112 (H) 101 - 111 mmol/L   CO2 21 (L) 22 - 32 mmol/L   Glucose, Bld 156 (H) 65 - 99 mg/dL   BUN 33 (H) 6 - 20 mg/dL   Creatinine, Ser 0.25 (H) 0.44 - 1.00 mg/dL   Calcium 9.6 8.9 - 42.7 mg/dL   GFR calc non Af Amer 17 (L) >60 mL/min   GFR calc Af Amer 20 (L) >60 mL/min   Anion gap 12 5 - 15   Dg Humerus Left  Result Date: 07/08/2017 CLINICAL DATA:  Initial evaluation for acute pain status post fall. EXAM: LEFT HUMERUS - 2+ VIEW COMPARISON:  None. FINDINGS: There is an acute comminuted fracture involving the midshaft of the left humerus with angulation. Mild lateral and posterior displacement of the main butterfly fragment. Bones are diffusely osteopenic. No soft tissue abnormality. IMPRESSION: Acute comminuted fracture involving the midshaft of the left humerus.  Electronically Signed   By: Rise Mu M.D.   On: 07/08/2017 13:52    Radiology No results found.  Procedures Procedures (including critical care time)  Medications Ordered in ED Medications  fentaNYL (SUBLIMAZE) injection 100 mcg (not administered)     Initial Impression / Assessment and Plan / ED Course  I have reviewed the triage vital signs and the nursing notes.  Pertinent labs & imaging results that were available during my care of the patient were reviewed by me and considered in my medical decision making (see chart for details).     X-rays viewed by me Case discussed with Dr. Ophelia Charter, orthopedist plan Sugar tong splint, sling, follow-up in office one week. Splint and sling were placed by nurse. At 3:20 PM patient is resting comfortably in splint and sling.left upper extremity Radial pulse 2+. Good capillary refill Adult Protective Services was called by nursing.   Renal insufficiency has worsened over April 2016 however is chronic.anemia is chronic. We'll write prescription for [employer], [place] Controlled Substance reporting System queried  Final Clinical Impressions(s) / ED Diagnoses  #1 closed fracture of left humerus #2 renal insufficiency #3 anemia Final diagnoses:  None    New Prescriptions New Prescriptions   No medications on file     Doug Sou, MD 07/08/17 1530

## 2017-07-08 NOTE — ED Notes (Signed)
Report given to Esther Hardy at [facility].  Transported back by EMS.

## 2017-07-08 NOTE — ED Triage Notes (Signed)
Ems reports pt from [facility].  Pt's brother says pt has been holding her left arm since Sunday.  Pt had x ray and staff says it shows a fracture.  Staff gave patient 5/325mg  hydrocodone around 1030 today.  Staff doesn't know what happened.

## 2017-07-08 NOTE — Discharge Instructions (Signed)
Call Dr. Marlene Bast office tomorrow to arrange to have Caitlin Riddle seen in his office in a week.

## 2017-07-08 NOTE — ED Notes (Signed)
Report made to [facility].  Spoke with Kym Groom who states a report will be made.

## 2017-07-09 DIAGNOSIS — S42402A Unspecified fracture of lower end of left humerus, initial encounter for closed fracture: Secondary | ICD-10-CM | POA: Diagnosis not present

## 2017-07-15 ENCOUNTER — Ambulatory Visit (INDEPENDENT_AMBULATORY_CARE_PROVIDER_SITE_OTHER): Payer: Medicare Other | Admitting: Orthopaedic Surgery

## 2017-07-15 ENCOUNTER — Encounter (INDEPENDENT_AMBULATORY_CARE_PROVIDER_SITE_OTHER): Payer: Self-pay | Admitting: Orthopaedic Surgery

## 2017-07-15 ENCOUNTER — Ambulatory Visit (INDEPENDENT_AMBULATORY_CARE_PROVIDER_SITE_OTHER): Payer: Medicare Other

## 2017-07-15 DIAGNOSIS — E785 Hyperlipidemia, unspecified: Secondary | ICD-10-CM | POA: Diagnosis not present

## 2017-07-15 DIAGNOSIS — E790 Hyperuricemia without signs of inflammatory arthritis and tophaceous disease: Secondary | ICD-10-CM | POA: Diagnosis not present

## 2017-07-15 DIAGNOSIS — S42332A Displaced oblique fracture of shaft of humerus, left arm, initial encounter for closed fracture: Secondary | ICD-10-CM

## 2017-07-15 NOTE — Progress Notes (Signed)
Office Visit Note   Patient: Caitlin Riddle           Date of Birth: Mar 22, 1941           MRN: [id number] Visit Date: 07/15/2017              Requested by: No referring provider defined for this encounter. PCP: Patient, No Pcp Per   Assessment & Plan: Visit Diagnoses:  1. Displaced oblique fracture of shaft of humerus, left arm, initial encounter for closed fracture     Plan: Patient has a displaced humeral shaft fracture with a large butterfly fragment. She also has poor bone quality and advanced dementia. We will attempt nonoperative treatment with Sarmiento brace. Follow up in one week with repeat 2 view x-rays of the left humerus in the Sarmiento brace.  Follow-Up Instructions: Return in about 1 week (around 07/22/2017).   Orders:  Orders Placed This Encounter  Procedures  . XR Humerus Left   No orders of the defined types were placed in this encounter.     Procedures: No procedures performed   Clinical Data: No additional findings.   Subjective: Chief Complaint  Patient presents with  . Left Shoulder - Pain    Patient's 76 year old female with advanced dementia who comes in with displaced left humeral shaft fracture that was discovered in the ER on 07/08/2017. The suspected date of injury was 06/26/2017. They are currently investigating how the patient was injured. She lives at a nursing home permanently.    Review of Systems  Constitutional: Negative.   HENT: Negative.   Eyes: Negative.   Respiratory: Negative.   Cardiovascular: Negative.   Endocrine: Negative.   Musculoskeletal: Negative.   Neurological: Negative.   Hematological: Negative.   Psychiatric/Behavioral: Negative.   All other systems reviewed and are negative.    Objective: Vital Signs: There were no vitals taken for this visit.  Physical Exam  Constitutional: She is oriented to person, place, and time. She appears well-developed and well-nourished.  HENT:  Head:  Normocephalic and atraumatic.  Eyes: EOM are normal.  Neck: Neck supple.  Pulmonary/Chest: Effort normal.  Abdominal: Soft.  Neurological: She is alert and oriented to person, place, and time.  Skin: Skin is warm. Capillary refill takes less than 2 seconds.  Psychiatric: She has a normal mood and affect. Her behavior is normal. Judgment and thought content normal.  Nursing note and vitals reviewed.   Ortho Exam Left arm exam shows no significant swelling. Neurovascularly intact grossly. Cannot cooperate with the motor exam secondary to dementia. Specialty Comments:  No specialty comments available.  Imaging: No results found.   PMFS History: Patient Active Problem List   Diagnosis Date Noted  . Displaced oblique fracture of shaft of humerus, left arm, initial encounter for closed fracture 07/15/2017  . Intertrochanteric fracture of right femur (HCC) 01/11/2015  . Fall   . Closed right hip fracture (HCC) 01/10/2015  . CKD (chronic kidney disease) stage 3, GFR 30-59 ml/min (HCC) 01/10/2015  . Osteoporosis 09/14/2014  . Hyperlipidemia 07/28/2014  . Pressure ulcer of right ankle 11/29/2013  . Hypothyroidism 10/17/2013  . Weakness of right leg 09/16/2013  . Osteoarthritis of right knee 09/16/2013  . Alzheimer's disease 08/19/2013  . Type 2 diabetes mellitus with HbA1C goal below 7.5 07/14/2013  . Loss of weight 07/14/2013  . UTI (lower urinary tract infection) 12/11/2011  . Arthritis of knee, right 12/11/2011  . Fever 12/11/2011  . Leukocytosis 12/11/2011  . Rheumatoid arthritis (HCC)  12/11/2011  . Iron deficiency anemia 05/22/2009  . HELICOBACTER PYLORI GASTRITIS 05/17/2009  . COLONOSCOPY, HX OF 04/20/2009  . KNEE PAIN 05/03/2008   Past Medical History:  Diagnosis Date  . Alzheimer disease   . Arthralgia   . Arthritis   . Back pain   . CKD (chronic kidney disease)    stage II  . Dehydration   . Dementia   . Diabetes mellitus   . Dysphagia   . Failure to thrive  (0-17)   . Falls   . Hyperlipidemia   . Hypertension   . Iron deficiency anemia   . Renal insufficiency   . Rheumatoid arthritis (HCC)   . Thyroid disease   . UTI (urinary tract infection)   . Vision loss of left eye     No family history on file.  Past Surgical History:  Procedure Laterality Date  . COLONOSCOPY    . ESOPHAGOGASTRODUODENOSCOPY    . INTRAMEDULLARY (IM) NAIL INTERTROCHANTERIC Right 01/11/2015   Procedure: INTERNAL FIXATION RIGHT HIP;  Surgeon: Vickki Hearing, MD;  Location: [facility];  Service: Orthopedics;  Laterality: Right;  . TOTAL ABDOMINAL HYSTERECTOMY     Social History   Occupational History  . Not on file.   Social History Main Topics  . Smoking status: Never Smoker  . Smokeless tobacco: Never Used  . Alcohol use No  . Drug use: No  . Sexual activity: Not Currently

## 2017-07-17 DIAGNOSIS — S52202A Unspecified fracture of shaft of left ulna, initial encounter for closed fracture: Secondary | ICD-10-CM | POA: Diagnosis not present

## 2017-07-22 ENCOUNTER — Inpatient Hospital Stay (HOSPITAL_COMMUNITY)
Admission: EM | Admit: 2017-07-22 | Discharge: 2017-07-24 | DRG: 871 | Disposition: A | Discharge status: Hospice | Payer: Medicare Other | Attending: Internal Medicine | Admitting: Internal Medicine

## 2017-07-22 ENCOUNTER — Encounter (INDEPENDENT_AMBULATORY_CARE_PROVIDER_SITE_OTHER): Payer: Self-pay

## 2017-07-22 ENCOUNTER — Emergency Department (HOSPITAL_COMMUNITY): Payer: Medicare Other

## 2017-07-22 ENCOUNTER — Encounter (HOSPITAL_COMMUNITY): Payer: Self-pay

## 2017-07-22 ENCOUNTER — Ambulatory Visit (INDEPENDENT_AMBULATORY_CARE_PROVIDER_SITE_OTHER): Payer: Medicare Other | Admitting: Orthopaedic Surgery

## 2017-07-22 DIAGNOSIS — A4181 Sepsis due to Enterococcus: Secondary | ICD-10-CM | POA: Diagnosis not present

## 2017-07-22 DIAGNOSIS — E1122 Type 2 diabetes mellitus with diabetic chronic kidney disease: Secondary | ICD-10-CM | POA: Diagnosis present

## 2017-07-22 DIAGNOSIS — Z681 Body mass index (BMI) 19 or less, adult: Secondary | ICD-10-CM | POA: Diagnosis not present

## 2017-07-22 DIAGNOSIS — H5462 Unqualified visual loss, left eye, normal vision right eye: Secondary | ICD-10-CM | POA: Diagnosis present

## 2017-07-22 DIAGNOSIS — A419 Sepsis, unspecified organism: Secondary | ICD-10-CM | POA: Diagnosis not present

## 2017-07-22 DIAGNOSIS — N179 Acute kidney failure, unspecified: Secondary | ICD-10-CM | POA: Diagnosis not present

## 2017-07-22 DIAGNOSIS — E1151 Type 2 diabetes mellitus with diabetic peripheral angiopathy without gangrene: Secondary | ICD-10-CM | POA: Diagnosis not present

## 2017-07-22 DIAGNOSIS — E118 Type 2 diabetes mellitus with unspecified complications: Secondary | ICD-10-CM | POA: Diagnosis not present

## 2017-07-22 DIAGNOSIS — R000 Tachycardia, unspecified: Secondary | ICD-10-CM | POA: Diagnosis not present

## 2017-07-22 DIAGNOSIS — D509 Iron deficiency anemia, unspecified: Secondary | ICD-10-CM | POA: Diagnosis present

## 2017-07-22 DIAGNOSIS — G9341 Metabolic encephalopathy: Secondary | ICD-10-CM | POA: Diagnosis not present

## 2017-07-22 DIAGNOSIS — Z23 Encounter for immunization: Secondary | ICD-10-CM | POA: Diagnosis not present

## 2017-07-22 DIAGNOSIS — E872 Acidosis: Secondary | ICD-10-CM | POA: Diagnosis not present

## 2017-07-22 DIAGNOSIS — Z993 Dependence on wheelchair: Secondary | ICD-10-CM

## 2017-07-22 DIAGNOSIS — I129 Hypertensive chronic kidney disease with stage 1 through stage 4 chronic kidney disease, or unspecified chronic kidney disease: Secondary | ICD-10-CM | POA: Diagnosis present

## 2017-07-22 DIAGNOSIS — Z7189 Other specified counseling: Secondary | ICD-10-CM | POA: Diagnosis not present

## 2017-07-22 DIAGNOSIS — E871 Hypo-osmolality and hyponatremia: Secondary | ICD-10-CM | POA: Diagnosis not present

## 2017-07-22 DIAGNOSIS — E785 Hyperlipidemia, unspecified: Secondary | ICD-10-CM | POA: Diagnosis present

## 2017-07-22 DIAGNOSIS — Z9181 History of falling: Secondary | ICD-10-CM

## 2017-07-22 DIAGNOSIS — E1165 Type 2 diabetes mellitus with hyperglycemia: Secondary | ICD-10-CM | POA: Diagnosis not present

## 2017-07-22 DIAGNOSIS — Z7401 Bed confinement status: Secondary | ICD-10-CM

## 2017-07-22 DIAGNOSIS — N183 Chronic kidney disease, stage 3 unspecified: Secondary | ICD-10-CM | POA: Diagnosis present

## 2017-07-22 DIAGNOSIS — Z515 Encounter for palliative care: Secondary | ICD-10-CM | POA: Diagnosis not present

## 2017-07-22 DIAGNOSIS — E860 Dehydration: Secondary | ICD-10-CM

## 2017-07-22 DIAGNOSIS — M1711 Unilateral primary osteoarthritis, right knee: Secondary | ICD-10-CM | POA: Diagnosis present

## 2017-07-22 DIAGNOSIS — W19XXXD Unspecified fall, subsequent encounter: Secondary | ICD-10-CM | POA: Diagnosis present

## 2017-07-22 DIAGNOSIS — E43 Unspecified severe protein-calorie malnutrition: Secondary | ICD-10-CM | POA: Diagnosis not present

## 2017-07-22 DIAGNOSIS — L89519 Pressure ulcer of right ankle, unspecified stage: Secondary | ICD-10-CM | POA: Diagnosis present

## 2017-07-22 DIAGNOSIS — G309 Alzheimer's disease, unspecified: Secondary | ICD-10-CM | POA: Diagnosis not present

## 2017-07-22 DIAGNOSIS — E870 Hyperosmolality and hypernatremia: Secondary | ICD-10-CM | POA: Diagnosis not present

## 2017-07-22 DIAGNOSIS — N170 Acute kidney failure with tubular necrosis: Secondary | ICD-10-CM | POA: Diagnosis not present

## 2017-07-22 DIAGNOSIS — S42332A Displaced oblique fracture of shaft of humerus, left arm, initial encounter for closed fracture: Secondary | ICD-10-CM | POA: Diagnosis not present

## 2017-07-22 DIAGNOSIS — R64 Cachexia: Secondary | ICD-10-CM | POA: Diagnosis present

## 2017-07-22 DIAGNOSIS — R0682 Tachypnea, not elsewhere classified: Secondary | ICD-10-CM | POA: Diagnosis not present

## 2017-07-22 DIAGNOSIS — Z794 Long term (current) use of insulin: Secondary | ICD-10-CM

## 2017-07-22 DIAGNOSIS — R627 Adult failure to thrive: Secondary | ICD-10-CM | POA: Diagnosis present

## 2017-07-22 DIAGNOSIS — Z87440 Personal history of urinary (tract) infections: Secondary | ICD-10-CM

## 2017-07-22 DIAGNOSIS — N390 Urinary tract infection, site not specified: Secondary | ICD-10-CM | POA: Diagnosis present

## 2017-07-22 DIAGNOSIS — R29898 Other symptoms and signs involving the musculoskeletal system: Secondary | ICD-10-CM | POA: Diagnosis present

## 2017-07-22 DIAGNOSIS — Z66 Do not resuscitate: Secondary | ICD-10-CM | POA: Diagnosis present

## 2017-07-22 DIAGNOSIS — E039 Hypothyroidism, unspecified: Secondary | ICD-10-CM | POA: Diagnosis present

## 2017-07-22 DIAGNOSIS — L84 Corns and callosities: Secondary | ICD-10-CM | POA: Diagnosis not present

## 2017-07-22 DIAGNOSIS — Z79899 Other long term (current) drug therapy: Secondary | ICD-10-CM

## 2017-07-22 DIAGNOSIS — R404 Transient alteration of awareness: Secondary | ICD-10-CM | POA: Diagnosis not present

## 2017-07-22 DIAGNOSIS — M069 Rheumatoid arthritis, unspecified: Secondary | ICD-10-CM | POA: Diagnosis present

## 2017-07-22 DIAGNOSIS — S72001A Fracture of unspecified part of neck of right femur, initial encounter for closed fracture: Secondary | ICD-10-CM | POA: Diagnosis present

## 2017-07-22 DIAGNOSIS — F0280 Dementia in other diseases classified elsewhere without behavioral disturbance: Secondary | ICD-10-CM | POA: Diagnosis present

## 2017-07-22 DIAGNOSIS — M810 Age-related osteoporosis without current pathological fracture: Secondary | ICD-10-CM | POA: Diagnosis present

## 2017-07-22 DIAGNOSIS — Z90710 Acquired absence of both cervix and uterus: Secondary | ICD-10-CM

## 2017-07-22 DIAGNOSIS — S42332D Displaced oblique fracture of shaft of humerus, left arm, subsequent encounter for fracture with routine healing: Secondary | ICD-10-CM

## 2017-07-22 DIAGNOSIS — J9811 Atelectasis: Secondary | ICD-10-CM | POA: Diagnosis not present

## 2017-07-22 DIAGNOSIS — R4182 Altered mental status, unspecified: Secondary | ICD-10-CM

## 2017-07-22 HISTORY — DX: Unspecified fracture of shaft of humerus, unspecified arm, initial encounter for closed fracture: S42.309A

## 2017-07-22 LAB — LACTIC ACID, PLASMA
LACTIC ACID, VENOUS: 3.9 mmol/L — AB (ref 0.5–1.9)
Lactic Acid, Venous: 2.9 mmol/L (ref 0.5–1.9)

## 2017-07-22 LAB — CBC WITH DIFFERENTIAL/PLATELET
BASOS PCT: 0 %
Basophils Absolute: 0 10*3/uL (ref 0.0–0.1)
EOS ABS: 0.1 10*3/uL (ref 0.0–0.7)
EOS PCT: 0 %
HCT: 36 % (ref 36.0–46.0)
Hemoglobin: 11.5 g/dL — ABNORMAL LOW (ref 12.0–15.0)
LYMPHS ABS: 3 10*3/uL (ref 0.7–4.0)
Lymphocytes Relative: 21 %
MCH: 26.9 pg (ref 26.0–34.0)
MCHC: 31.9 g/dL (ref 30.0–36.0)
MCV: 84.1 fL (ref 78.0–100.0)
MONO ABS: 0.9 10*3/uL (ref 0.1–1.0)
MONOS PCT: 6 %
NEUTROS PCT: 73 %
Neutro Abs: 9.9 10*3/uL — ABNORMAL HIGH (ref 1.7–7.7)
PLATELETS: 304 10*3/uL (ref 150–400)
RBC: 4.28 MIL/uL (ref 3.87–5.11)
RDW: 15.7 % — AB (ref 11.5–15.5)
WBC: 13.8 10*3/uL — ABNORMAL HIGH (ref 4.0–10.5)

## 2017-07-22 LAB — URINALYSIS, ROUTINE W REFLEX MICROSCOPIC
BILIRUBIN URINE: NEGATIVE
GLUCOSE, UA: NEGATIVE mg/dL
Ketones, ur: 5 mg/dL — AB
NITRITE: NEGATIVE
PH: 5 (ref 5.0–8.0)
Protein, ur: 30 mg/dL — AB
SPECIFIC GRAVITY, URINE: 1.013 (ref 1.005–1.030)
Squamous Epithelial / LPF: NONE SEEN

## 2017-07-22 LAB — COMPREHENSIVE METABOLIC PANEL
ALK PHOS: 135 U/L — AB (ref 38–126)
ALT: 15 U/L (ref 14–54)
AST: 26 U/L (ref 15–41)
Albumin: 3.4 g/dL — ABNORMAL LOW (ref 3.5–5.0)
Anion gap: 19 — ABNORMAL HIGH (ref 5–15)
BUN: 58 mg/dL — AB (ref 6–20)
CALCIUM: 9.8 mg/dL (ref 8.9–10.3)
CHLORIDE: 123 mmol/L — AB (ref 101–111)
CO2: 16 mmol/L — AB (ref 22–32)
Creatinine, Ser: 3.3 mg/dL — ABNORMAL HIGH (ref 0.44–1.00)
GFR calc non Af Amer: 13 mL/min — ABNORMAL LOW (ref >60)
GFR, EST AFRICAN AMERICAN: 15 mL/min — AB (ref >60)
Glucose, Bld: 212 mg/dL — ABNORMAL HIGH (ref 65–99)
Potassium: 4.5 mmol/L (ref 3.5–5.1)
SODIUM: 158 mmol/L — AB (ref 135–145)
Total Bilirubin: 1 mg/dL (ref 0.3–1.2)
Total Protein: 8.7 g/dL — ABNORMAL HIGH (ref 6.5–8.1)

## 2017-07-22 LAB — TSH: TSH: 1.552 u[IU]/mL (ref 0.350–4.500)

## 2017-07-22 MED ORDER — SODIUM BICARBONATE 8.4 % IV SOLN: INTRAVENOUS | Status: DC

## 2017-07-22 MED ORDER — ACETAMINOPHEN 650 MG RE SUPP
650.0000 mg | Freq: Four times a day (QID) | RECTAL | Status: DC | PRN
  Administered 2017-07-24: 650 mg via RECTAL
  Filled 2017-07-22: qty 1

## 2017-07-22 MED ORDER — HEPARIN SODIUM (PORCINE) 5000 UNIT/ML IJ SOLN
5000.0000 [IU] | Freq: Three times a day (TID) | INTRAMUSCULAR | Status: DC
  Administered 2017-07-23 – 2017-07-24 (×5): 5000 [IU] via SUBCUTANEOUS
  Filled 2017-07-22 (×3): qty 1

## 2017-07-22 MED ORDER — SODIUM CHLORIDE 0.9 % IV BOLUS (SEPSIS)
1000.0000 mL | Freq: Once | INTRAVENOUS | Status: AC
  Administered 2017-07-22: 1000 mL via INTRAVENOUS

## 2017-07-22 MED ORDER — VANCOMYCIN HCL IN DEXTROSE 1-5 GM/200ML-% IV SOLN
1000.0000 mg | Freq: Once | INTRAVENOUS | Status: AC
  Administered 2017-07-22: 1000 mg via INTRAVENOUS
  Filled 2017-07-22: qty 200

## 2017-07-22 MED ORDER — ACETAMINOPHEN 325 MG PO TABS
650.0000 mg | ORAL_TABLET | Freq: Four times a day (QID) | ORAL | Status: DC | PRN
  Filled 2017-07-22: qty 2

## 2017-07-22 MED ORDER — CEFEPIME HCL 2 G IJ SOLR
2.0000 g | Freq: Once | INTRAMUSCULAR | Status: AC
  Administered 2017-07-22: 2 g via INTRAVENOUS
  Filled 2017-07-22: qty 2

## 2017-07-22 MED ORDER — INFLUENZA VAC SPLIT HIGH-DOSE 0.5 ML IM SUSY
0.5000 mL | PREFILLED_SYRINGE | INTRAMUSCULAR | Status: AC
  Administered 2017-07-24: 0.5 mL via INTRAMUSCULAR
  Filled 2017-07-22 (×2): qty 0.5

## 2017-07-22 MED ORDER — DEXTROSE 5 % IV SOLN
500.0000 mg | INTRAVENOUS | Status: DC
  Administered 2017-07-23: 500 mg via INTRAVENOUS
  Filled 2017-07-22 (×2): qty 0.5

## 2017-07-22 MED ORDER — SODIUM BICARBONATE 8.4 % IV SOLN
INTRAVENOUS | Status: DC
  Administered 2017-07-22 – 2017-07-23 (×3): via INTRAVENOUS
  Filled 2017-07-22 (×7): qty 100

## 2017-07-22 MED ORDER — SODIUM BICARBONATE 8.4 % IV SOLN
INTRAVENOUS | Status: AC
  Filled 2017-07-22: qty 50

## 2017-07-22 MED ORDER — ACETAMINOPHEN 650 MG RE SUPP
RECTAL | Status: AC
  Administered 2017-07-22: 14:00:00
  Filled 2017-07-22: qty 1

## 2017-07-22 MED ORDER — SODIUM CHLORIDE 0.9 % IV BOLUS (SEPSIS)
250.0000 mL | Freq: Once | INTRAVENOUS | Status: AC
  Administered 2017-07-22: 250 mL via INTRAVENOUS

## 2017-07-22 MED ORDER — LEVOTHYROXINE SODIUM 100 MCG IV SOLR
12.5000 ug | Freq: Every day | INTRAVENOUS | Status: DC
  Administered 2017-07-22 – 2017-07-24 (×3): 12.5 ug via INTRAVENOUS
  Filled 2017-07-22 (×3): qty 5

## 2017-07-22 MED ORDER — ONDANSETRON HCL 4 MG/2ML IJ SOLN: 4.0000 mg | Freq: Four times a day (QID) | INTRAMUSCULAR | Status: DC | PRN

## 2017-07-22 MED ORDER — ONDANSETRON HCL 4 MG PO TABS: 4.0000 mg | ORAL_TABLET | Freq: Four times a day (QID) | ORAL | Status: DC | PRN

## 2017-07-22 MED ORDER — INSULIN ASPART 100 UNIT/ML ~~LOC~~ SOLN
0.0000 [IU] | SUBCUTANEOUS | Status: DC
  Administered 2017-07-23: 7 [IU] via SUBCUTANEOUS
  Administered 2017-07-23: 5 [IU] via SUBCUTANEOUS
  Administered 2017-07-23 – 2017-07-24 (×2): 2 [IU] via SUBCUTANEOUS
  Administered 2017-07-24: 1 [IU] via SUBCUTANEOUS

## 2017-07-22 NOTE — Progress Notes (Signed)
Pharmacy Antibiotic Note  Caitlin Riddle is a 76 y.o. female admitted on 07/22/2017 with pneumonia.  Pharmacy has been consulted for Cefepime dosing.  Plan: Cefepime 2gm given in ED, then continue with 500mg  IV q24h F/U cxs and clinical progress Monitor V/S and labs  Weight: 90 lb (40.8 kg)  Temp (24hrs), Avg:102.7 F (39.3 C), Min:102.7 F (39.3 C), Max:102.7 F (39.3 C)   Recent Labs Lab 07/22/17 1410 07/22/17 1418  WBC 13.8*  --   CREATININE 3.30*  --   LATICACIDVEN  --  2.9*    CrCl 13mls/min   No Known Allergies  Antimicrobials this admission: Cefepime 10/16 >>  Vancomycin 10/16 x 1 dose in ED   Dose adjustments this admission: N/A  Microbiology results: 10/16 BCx: pending 10/16 UCx: pending   Thank you for allowing pharmacy to be a part of this patient's care.  11/16, BS Pharm D, [facility] Clinical Pharmacist Pager [phone]  07/22/2017 5:27 PM

## 2017-07-22 NOTE — ED Notes (Addendum)
Date and time results received: 07/22/17 6:26 PM   Test:  Lactic Acid, Venous    Component Value Date/Time   LATICACIDVEN 3.9 [facility]) 07/22/2017 1703      Name of Provider Notified: Hospitalist Dhungel paged  Orders Received? Or Actions Taken?:

## 2017-07-22 NOTE — ED Provider Notes (Signed)
Emergency Department Provider Note   I have reviewed the triage vital signs and the nursing notes.   HISTORY  Chief Complaint Altered Mental Status   HPI Caitlin Riddle is a 76 y.o. female with multiple medical problems as documented below his here for unresponsiveness. Patient is a DO NOT RESUSCITATE. The brother accompanies her to the emergency room and states the last time he saw her was on Saturday she was normal that time. She lives in a nursing facility secondary to hip fracture She also recently had a humerus fracture. He states that the facility said it she has been not like herself for the last 24 hours. She is normally alert and responsive but confined to a wheelchair. At this time patient is unresponsive and is unable to participate with history or review of systems.  LEVEL V Caveat Secondary to Decreased Mental Status   Past Medical History:  Diagnosis Date  . Alzheimer disease   . Arthralgia   . Arthritis   . Back pain   . CKD (chronic kidney disease)    stage II  . Dehydration   . Dementia   . Diabetes mellitus   . Dysphagia   . Failure to thrive (0-17)   . Failure to thrive (0-17)   . Falls   . Humerus fracture   . Hyperlipidemia   . Hypertension   . Iron deficiency anemia   . Renal insufficiency   . Rheumatoid arthritis (HCC)   . Thyroid disease   . UTI (urinary tract infection)   . Vision loss of left eye     Patient Active Problem List   Diagnosis Date Noted  . Displaced oblique fracture of shaft of humerus, left arm, initial encounter for closed fracture 07/15/2017  . Intertrochanteric fracture of right femur (HCC) 01/11/2015  . Fall   . Closed right hip fracture (HCC) 01/10/2015  . CKD (chronic kidney disease) stage 3, GFR 30-59 ml/min (HCC) 01/10/2015  . Osteoporosis 09/14/2014  . Hyperlipidemia 07/28/2014  . Pressure ulcer of right ankle 11/29/2013  . Hypothyroidism 10/17/2013  . Weakness of right leg 09/16/2013  . Osteoarthritis  of right knee 09/16/2013  . Alzheimer's disease 08/19/2013  . Type 2 diabetes mellitus with HbA1C goal below 7.5 07/14/2013  . Loss of weight 07/14/2013  . UTI (lower urinary tract infection) 12/11/2011  . Arthritis of knee, right 12/11/2011  . Fever 12/11/2011  . Leukocytosis 12/11/2011  . Rheumatoid arthritis (HCC) 12/11/2011  . Iron deficiency anemia 05/22/2009  . HELICOBACTER PYLORI GASTRITIS 05/17/2009  . COLONOSCOPY, HX OF 04/20/2009  . KNEE PAIN 05/03/2008    Past Surgical History:  Procedure Laterality Date  . COLONOSCOPY    . ESOPHAGOGASTRODUODENOSCOPY    . INTRAMEDULLARY (IM) NAIL INTERTROCHANTERIC Right 01/11/2015   Procedure: INTERNAL FIXATION RIGHT HIP;  Surgeon: Vickki Hearing, MD;  Location: [facility];  Service: Orthopedics;  Laterality: Right;  . TOTAL ABDOMINAL HYSTERECTOMY      Current Outpatient Rx  . Order #: [id number] Class: Historical Med  . Order #: [id number] Class: Historical Med  . Order #: [id number] Class: Historical Med  . Order #: [id number] Class: Historical Med  . Order #: [id number] Class: Historical Med  . Order #: [id number] Class: Print  . Order #: [id number] Class: Historical Med  . Order #: [id number] Class: Normal  . Order #: [id number] Class: Print  . Order #: [id number] Class: No Print  . Order #: [id number] Class: Historical Med  . Order #: [id number] Class: Historical Med  . Order #: [id number] Class: Normal  . Order #:  [id number] Class: Historical Med    Allergies Patient has no known allergies.  No family history on file.  Social History Social History  Substance Use Topics  . Smoking status: Never Smoker  . Smokeless tobacco: Never Used  . Alcohol use No    Review of Systems  Level V Caveat secondary to Altered Mental Status ____________________________________________   PHYSICAL EXAM:  VITAL SIGNS: ED Triage Vitals  Enc Vitals Group     BP 07/22/17 1406 130/68     Pulse Rate 07/22/17 1406 (!) 130     Resp 07/22/17 1406 (!) 21      Temp 07/22/17 1406 (!) 102.7 F (39.3 C)     Temp Source 07/22/17 1406 Rectal     SpO2 07/22/17 1406 95 %     Weight 07/22/17 1401 90 lb (40.8 kg)    Constitutional: cachectic. Ill appearing and in mild acute distress. Eyes: Conjunctivae are normal. R pupil normal symmetry, reactive to light. Left pupil hazy. Head: Atraumatic. Nose: No congestion/rhinnorhea. Mouth/Throat: Mucous membranes are dry.  Oropharynx non-erythematous. Neck: No stridor.  No meningeal signs.   Cardiovascular: tachycardic rate, regular rhythm. Good peripheral circulation. Grossly normal heart sounds.   Respiratory: tachypneic. respiratory effort.  No retractions. Lungs CTAB. Gastrointestinal: Soft and nontender. No distention.  Musculoskeletal: No lower extremity tenderness nor edema. No gross deformities of extremities. Neurologic:  Normal speech and language. No gross focal neurologic deficits are appreciated.  Skin:  Skin is warm, dry and intact. No rash noted.   ____________________________________________   LABS (all labs ordered are listed, but only abnormal results are displayed)  Labs Reviewed  COMPREHENSIVE METABOLIC PANEL - Abnormal; Notable for the following:       Result Value   Sodium 158 (*)    Chloride 123 (*)    CO2 16 (*)    Glucose, Bld 212 (*)    BUN 58 (*)    Creatinine, Ser 3.30 (*)    Total Protein 8.7 (*)    Albumin 3.4 (*)    Alkaline Phosphatase 135 (*)    GFR calc non Af Amer 13 (*)    GFR calc Af Amer 15 (*)    Anion gap 19 (*)    All other components within normal limits  CBC WITH DIFFERENTIAL/PLATELET - Abnormal; Notable for the following:    WBC 13.8 (*)    Hemoglobin 11.5 (*)    RDW 15.7 (*)    Neutro Abs 9.9 (*)    All other components within normal limits  URINALYSIS, ROUTINE W REFLEX MICROSCOPIC - Abnormal; Notable for the following:    APPearance CLOUDY (*)    Hgb urine dipstick SMALL (*)    Ketones, ur 5 (*)    Protein, ur 30 (*)    Leukocytes, UA LARGE  (*)    Bacteria, UA MANY (*)    All other components within normal limits  LACTIC ACID, PLASMA - Abnormal; Notable for the following:    Lactic Acid, Venous 2.9 (*)    All other components within normal limits  CULTURE, BLOOD (ROUTINE X 2)  CULTURE, BLOOD (ROUTINE X 2)  URINE CULTURE  LACTIC ACID, PLASMA   ____________________________________________  EKG   EKG Interpretation  Date/Time:  Tuesday July 22 2017 14:06:10 EDT Ventricular Rate:  129 PR Interval:    QRS Duration: 85 QT Interval:  311 QTC Calculation: 456 R Axis:   48 Text Interpretation:  Sinus tachycardia Borderline ST depression, anterolateral leads Baseline wander in lead(s) V6 Confirmed  by Marily Memos [phone]) on 07/22/2017 3:00:11 PM       ____________________________________________  RADIOLOGY  Dg Chest Portable 1 View  Result Date: 07/22/2017 CLINICAL DATA:  Fever, confusion, hypertension, diabetes mellitus, Alzheimer's, question pneumonia EXAM: PORTABLE CHEST 1 VIEW COMPARISON:  Portable exam 1545 hours compared to 01/10/2015 FINDINGS: Normal heart size, mediastinal contours, and pulmonary vascularity. Minimal LEFT basilar atelectasis. Lungs otherwise clear. No infiltrate, pleural effusion or pneumothorax. Diffuse osseous demineralization. Comminuted displaced and angulated mid LEFT humeral diaphyseal fracture. IMPRESSION: Minimal LEFT basilar atelectasis. Comminuted displaced and angulated LEFT humeral diaphyseal fracture. Electronically Signed   By: Ulyses Southward M.D.   On: 07/22/2017 16:01    ____________________________________________   PROCEDURES  Procedure(s) performed:   Procedures  CRITICAL CARE Performed by: Marily Memos Total critical care time: 35 minutes Critical care time was exclusive of separately billable procedures and treating other patients. Critical care was necessary to treat or prevent imminent or life-threatening deterioration. Critical care was time spent personally  by me on the following activities: development of treatment plan with patient and/or surrogate as well as nursing, discussions with consultants, evaluation of patient's response to treatment, examination of patient, obtaining history from patient or surrogate, ordering and performing treatments and interventions, ordering and review of laboratory studies, ordering and review of radiographic studies, pulse oximetry and re-evaluation of patient's condition.  ____________________________________________   INITIAL IMPRESSION / ASSESSMENT AND PLAN / ED COURSE  Pertinent labs & imaging results that were available during my care of the patient were reviewed by me and considered in my medical decision making (see chart for details).  At this time no obvious sources of infection however does have a history of UTIs. She is to Go get a chest x-ray. No evidence of cellulitis on exam. No evidence peritonitis on exam. We will get of and out catheterization to evaluate her urine. Unknown source so vancomycin and cefepime started. Otherwise code sepsis activated and will need to be admitted to the hospital.  On reexamination patient is having some purposeful movements and improved heart rate. Sisters at [facility] and states that she still not acting like herself.Marland Kitchen Antibiotics and has a confirmed urinary tract infection. Likely being covered with the vancomycin and cefepime she received.  She is also found to have hypernatremia, for which I have given normal saline bolus and should correct slowly over time.  Also with acute kidney injury and once again is receiving fluid boluses to help with that. This I think the cause of mental status likely multifactorial from the infection, elevated sodium and elevated BUN. Plan will be for admission to the hospital.  ____________________________________________  FINAL CLINICAL IMPRESSION(S) / ED DIAGNOSES  Final diagnoses:  Altered mental status, unspecified altered mental  status type  Dehydration  AKI (acute kidney injury) (HCC)  Hypernatremia  Urinary tract infection without hematuria, site unspecified  Sepsis, due to unspecified organism [facility])     MEDICATIONS GIVEN DURING THIS VISIT:  Medications  vancomycin (VANCOCIN) IVPB 1000 mg/200 mL premix (1,000 mg Intravenous New Bag/Given 07/22/17 1524)  sodium chloride 0.9 % bolus 1,000 mL (not administered)  sodium chloride 0.9 % bolus 1,000 mL (1,000 mLs Intravenous New Bag/Given 07/22/17 1438)    And  sodium chloride 0.9 % bolus 250 mL (250 mLs Intravenous New Bag/Given 07/22/17 1438)  acetaminophen (TYLENOL) 650 MG suppository (  Given 07/22/17 1415)  ceFEPIme (MAXIPIME) 2 g in dextrose 5 % 50 mL IVPB (0 g Intravenous Stopped 07/22/17 1524)     NEW  OUTPATIENT MEDICATIONS STARTED DURING THIS VISIT:  New Prescriptions   No medications on file    Note:  This document was prepared using Dragon voice recognition software and may include unintentional dictation errors.   Marily Memos, MD 07/22/17 [phone]

## 2017-07-22 NOTE — ED Notes (Signed)
CRITICAL VALUE ALERT  Critical Value:  Lactic acid 2.9  Date & Time Notied:  07/22/17 1526  Provider Notified: BD, RN  Orders Received/Actions taken: notified EDP [place]

## 2017-07-22 NOTE — H&P (Signed)
TRH H&P   Patient Demographics:    Caitlin Riddle, is a 76 y.o. female  MRN: [id number]   DOB - 08/25/1941  Admit Date - 07/22/2017  Outpatient Primary MD for the patient is Patient, No Pcp Per  Referring MD: Dr. Erin Hearing  Outpatient Specialists: Been on orthopedics  Patient coming from: [facility] skilled nursing facility  Chief Complaint  Patient presents with  . Altered Mental Status      HPI:    Caitlin Riddle  is a 76 y.o. female, with history of diabetes mellitus, chronic kidney disease (ossibly stage III), severe malnutrition, severe Alzheimer's dementia who is bedbound and resident of skilled nursing facility,hypothyroidism, history of right hip fracture status post repair but has been nonambulatory who was sent from skilled nursing facility for altered mental status. Family today went to visit her and found her to be minimally responsive. They last saw her 3 days back and was normal. The facility told the family that patient has not been herself in the past 24 hours. At baseline patient is alert and responds to simple commands but communicates poorly and unable to carry out normal conversation due to severe dementia. She is mostly confined to bed or wheelchair. No history of fever, vomiting,complain of chest pain, shortness of breath, abdominal pain, dysuria or diarrhea at the facility.  Patient recently fell at the nursing facility 2 weeks back and had a displaced fracture of the right humerus. She was seen by [place] orthopedics and recommended nonoperative management with follow-up two-view x-ray of the humerus in 1 week.  Course in the ED Patient was septic with fever of 102.46F, heart rate of 1:30, tachypneic, blood pressure 172/84 mmHg, Blood work showed WBC of 13.8 K hemoglobin of 11.5, platelets of 30 file. Patient was hyponatremic with sodium of 158, chloride  of 123, CO2 of 16, BUN of 58 and creatinine of 3.30.  Blood glucose was 212 . lactic acid of 2.9. Neck line UA was suggestive of UTI. Chest 6 and negative for any infiltrate.     Review of systems:    As outlined in history of present illness. 10 point review of systems Limited due to patient's severe dementia and encephalopathy.  With Past History of the following :    Past Medical History:  Diagnosis Date  . Alzheimer disease   . Arthralgia   . Arthritis   . Back pain   . CKD (chronic kidney disease)    stage II  . Dehydration   . Dementia   . Diabetes mellitus   . Dysphagia   . Failure to thrive (0-17)   . Failure to thrive (0-17)   . Falls   . Humerus fracture   . Hyperlipidemia   . Hypertension   . Iron deficiency anemia   . Renal insufficiency   . Rheumatoid arthritis (HCC)   . Thyroid disease   .  UTI (urinary tract infection)   . Vision loss of left eye       Past Surgical History:  Procedure Laterality Date  . COLONOSCOPY    . ESOPHAGOGASTRODUODENOSCOPY    . INTRAMEDULLARY (IM) NAIL INTERTROCHANTERIC Right 01/11/2015   Procedure: INTERNAL FIXATION RIGHT HIP;  Surgeon: Vickki Hearing, MD;  Location: [facility];  Service: Orthopedics;  Laterality: Right;  . TOTAL ABDOMINAL HYSTERECTOMY        Social History:     Social History  Substance Use Topics  . Smoking status: Never Smoker  . Smokeless tobacco: Never Used  . Alcohol use No     Lives - at nursing home  Mobility - bedbound     Family History :   No pertinent family history of heart disease, stroke    Home Medications:   Prior to Admission medications   Medication Sig Start Date End Date Taking? Authorizing Provider  acetaminophen (TYLENOL) 325 MG tablet Take 650 mg by mouth every 4 (four) hours as needed for mild pain or moderate pain.   Yes [provider]  allopurinol (ZYLOPRIM) 100 MG tablet Take 100 mg by mouth daily.   Yes [provider]  Amino Acids-Protein  Hydrolys (FEEDING SUPPLEMENT, PRO-STAT SUGAR FREE 64,) LIQD Take 30 mLs by mouth 2 (two) times daily.   Yes [provider]  Cholecalciferol (VITAMIN D3) 5000 UNITS CAPS Take 5,000 Units by mouth daily.   Yes [provider]  colchicine 0.6 MG tablet Take 0.3 mg by mouth daily.   Yes [provider]  HYDROcodone-acetaminophen (NORCO) 5-325 MG tablet Take 1 tablet by mouth every 6 (six) hours as needed for moderate pain. 07/08/17  Yes Doug Sou, MD  insulin lispro (HUMALOG KWIKPEN) 100 UNIT/ML KiwkPen Inject 2-14 Units into the skin 3 (three) times daily before meals. 150-200= 2 unit 201-250= 4 unit 251-300= 6 unit 301-350= 8 unit 351-400=14unit   Yes [provider]  levothyroxine (SYNTHROID, LEVOTHROID) 25 MCG tablet Take 1 tablet (25 mcg total) by mouth daily before breakfast. 12/28/13  Yes Luking, Jonna Coup, MD  memantine (NAMENDA) 5 MG tablet Take 1 tablet (5 mg total) by mouth 2 (two) times daily. 11/29/13  Yes Babs Sciara, MD  metoprolol succinate (TOPROL-XL) 25 MG 24 hr tablet Take 0.5 tablets (12.5 mg total) by mouth daily. 01/13/15  Yes Black, Lesle Chris, NP  mirtazapine (REMERON) 7.5 MG tablet Take 7.5 mg by mouth at bedtime.   Yes [provider]  ondansetron (ZOFRAN) 4 MG tablet Take 4 mg by mouth every 6 (six) hours as needed for nausea or vomiting.   Yes [provider]  pravastatin (PRAVACHOL) 20 MG tablet TAKE 1 TABLET BY MOUTH ONCE DAILY. 10/25/14  Yes Merlyn Albert, MD  Propylene Glycol (SYSTANE BALANCE OP) Apply 1 drop to eye every 8 (eight) hours as needed (for dry eyes).    Yes [provider]     Allergies:    No Known Allergies   Physical Exam:   Vitals  Blood pressure (!) 172/84, pulse (!) 130, temperature (!) 102.7 F (39.3 C), temperature source Rectal, resp. rate (!) 22, weight 40.8 kg (90 lb), SpO2 95 %.   1. General elderly cachectic female lying in bed in no acute distress, opens eyes on  command but nonverbal HEENT: pupils reactive bilaterally, EOMI,Pallor present, temporal wasting, dry oral mucosa,supple neck Chest: Clear to auscultation bilaterally, no added sounds   CVS: S1 and S2 tachycardic, no murmurs rub  or gallop GI: Soft, nondistended, nontender, bowel sounds present musculoskeletal: Thin extremities,imited mobility of the left hand due to pain. Contracted extremities. CNS: Eye opening to commands, nonverbal and noncommunicative.    Data Review:    CBC  Recent Labs Lab 07/22/17 1410  WBC 13.8*  HGB 11.5*  HCT 36.0  PLT 304  MCV 84.1  MCH 26.9  MCHC 31.9  RDW 15.7*  LYMPHSABS 3.0  MONOABS 0.9  EOSABS 0.1  BASOSABS 0.0   ------------------------------------------------------------------------------------------------------------------  Chemistries   Recent Labs Lab 07/22/17 1410  NA 158*  K 4.5  CL 123*  CO2 16*  GLUCOSE 212*  BUN 58*  CREATININE 3.30*  CALCIUM 9.8  AST 26  ALT 15  ALKPHOS 135*  BILITOT 1.0   ------------------------------------------------------------------------------------------------------------------ CrCl cannot be calculated (Unknown ideal weight.). ------------------------------------------------------------------------------------------------------------------ No results for input(s): TSH, T4TOTAL, T3FREE, THYROIDAB in the last 72 hours.  Invalid input(s): FREET3  Coagulation profile No results for input(s): INR, PROTIME in the last 168 hours. ------------------------------------------------------------------------------------------------------------------- No results for input(s): DDIMER in the last 72 hours. -------------------------------------------------------------------------------------------------------------------  Cardiac Enzymes No results for input(s): CKMB, TROPONINI, MYOGLOBIN in the last 168 hours.  Invalid input(s):  CK ------------------------------------------------------------------------------------------------------------------    Component Value Date/Time   BNP 106.0 (H) 01/10/2015 1252     ---------------------------------------------------------------------------------------------------------------  Urinalysis    Component Value Date/Time   COLORURINE YELLOW 07/22/2017 1403   APPEARANCEUR CLOUDY (A) 07/22/2017 1403   LABSPEC 1.013 07/22/2017 1403   PHURINE 5.0 07/22/2017 1403   GLUCOSEU NEGATIVE 07/22/2017 1403   HGBUR SMALL (A) 07/22/2017 1403   BILIRUBINUR NEGATIVE 07/22/2017 1403   KETONESUR 5 (A) 07/22/2017 1403   PROTEINUR 30 (A) 07/22/2017 1403   UROBILINOGEN 0.2 01/17/2014 1204   NITRITE NEGATIVE 07/22/2017 1403   LEUKOCYTESUR LARGE (A) 07/22/2017 1403    ----------------------------------------------------------------------------------------------------------------   Imaging Results:    Dg Chest Portable 1 View  Result Date: 07/22/2017 CLINICAL DATA:  Fever, confusion, hypertension, diabetes mellitus, Alzheimer's, question pneumonia EXAM: PORTABLE CHEST 1 VIEW COMPARISON:  Portable exam 1545 hours compared to 01/10/2015 FINDINGS: Normal heart size, mediastinal contours, and pulmonary vascularity. Minimal LEFT basilar atelectasis. Lungs otherwise clear. No infiltrate, pleural effusion or pneumothorax. Diffuse osseous demineralization. Comminuted displaced and angulated mid LEFT humeral diaphyseal fracture. IMPRESSION: Minimal LEFT basilar atelectasis. Comminuted displaced and angulated LEFT humeral diaphyseal fracture. Electronically Signed   By: Ulyses Southward M.D.   On: 07/22/2017 16:01    My personal review of EKG: sinus tachycardia at 129 with borderline ST depression in anterior leads.   Assessment & Plan:    Principal Problem:   Sepsis (HCC) Possibly secondary to UTI. Sepsis pathway initiated in the ED. Patient order for total 2250 mL IV normal saline bolus. Will place  her on maintenance normal saline at 1 25 mL per hour. Trended lactic acid. Received vancomycin and cefepime in the ED. I would place her on empiric cefepime. Follow urine culture results. Blood cultures sent from the ED.   Active Problems:   Acute metabolic encephalopathy Possibly secondary to sepsis. Patient has severe dementia with poor interaction at baseline. She is bedbound, is awake but minimally communicates with family and most he does not recognize them.   Acute kidney injury on CKD (chronic kidney disease) stage 3, GFR 30-59 ml/min (HCC) Recent renal function showed creatinine of 2.62.(her creatinine was 1.4 about 2 years back)  Worsened likely due to sepsis. Monitor with IV fluids  Hypernatremia  Possibly due to severe dehydration. Following IV fluid bolus I will  place her on D5 with 2 amps of bicarbonate. Monitor labs in a.m.  Metabolic acidosis Dehydration vs ATN. Add 2 amps of bicarbonate with fluids. Monitor lab in a.m.    Displaced oblique fracture of shaft of humerus, left arm, initial encounter for closed fracture sustained recent fracture after a fall at nursing home. She was seen by Dr Oneita Jolly orthopedics and recommended nonoperative management with sarmiento brace. He had recommended follow-up two-view x-ray of the left humerus on the brace in 1 week. She had an appointment with him today. X-ray can be performed tomorrow once hemodynamically more stable and consult orthopedics.    Type 2 diabetes mellitus with complication, without long-term current use of insulin (HCC) Check A1c. Monitor on sliding scale coverage.    Severe protein-calorie malnutrition [facility]) Nutrition consult once patient more oriented and able to take by mouth    Iron deficiency anemia Monitor H&H closely.    Alzheimer's disease Has severe dementia. hold Namenda.  Hypothyroidism Will give her IV dose of Synthroid. Check TSH.      Pressure ulcer of right ankle Care per nursing.     Closed right hip fracture (HCC) Underwent  nail implant in April 2016. Patient per family he has been bedbound and nonambulatory for several months.  Goals of care Given her multiple medical problems, severe malnutrition, advanced dementia and failure to thrive her overall long-term prognosis is guarded. I discussed this with her sister at bedside and offered her palliative care consulted for goals of care discussion to which she agreed.  DVT Prophylaxis : Subcutaneous heparin  AM Labs Ordered, also please review Full Orders  Family Communication: Admission, patients condition and plan of care including tests being ordered have been discussed with patient's sister at bedside  Code Status DO NOT RESUSCITATE  Likely DC to  SNF  Condition GUARDED    Consults called: palliative care    Admission status: inpatient    Time spent in minutes : 70   Eddie North M.D on 07/22/2017 at 5:19 PM  Between 7am to 7pm - Pager - [phone]. After 7pm go to [email or web address] - password [facility]  [facility] - Office  [phone]

## 2017-07-22 NOTE — ED Triage Notes (Signed)
Pt from [facility] nursing facility.  Staff reports pt has had fever 100.6 today and decreased responsiveness.  Reports pt usually alert and responsive but confused.  Reports pt has dementia and is wheelchair bound.  Pt has fractured left arm.

## 2017-07-23 ENCOUNTER — Encounter (HOSPITAL_COMMUNITY): Payer: Self-pay | Admitting: Primary Care

## 2017-07-23 DIAGNOSIS — E870 Hyperosmolality and hypernatremia: Secondary | ICD-10-CM | POA: Diagnosis present

## 2017-07-23 DIAGNOSIS — G309 Alzheimer's disease, unspecified: Secondary | ICD-10-CM

## 2017-07-23 DIAGNOSIS — F0280 Dementia in other diseases classified elsewhere without behavioral disturbance: Secondary | ICD-10-CM

## 2017-07-23 DIAGNOSIS — N390 Urinary tract infection, site not specified: Secondary | ICD-10-CM

## 2017-07-23 DIAGNOSIS — Z7189 Other specified counseling: Secondary | ICD-10-CM

## 2017-07-23 DIAGNOSIS — E118 Type 2 diabetes mellitus with unspecified complications: Secondary | ICD-10-CM

## 2017-07-23 DIAGNOSIS — N179 Acute kidney failure, unspecified: Secondary | ICD-10-CM | POA: Diagnosis present

## 2017-07-23 DIAGNOSIS — S42332A Displaced oblique fracture of shaft of humerus, left arm, initial encounter for closed fracture: Secondary | ICD-10-CM

## 2017-07-23 DIAGNOSIS — A419 Sepsis, unspecified organism: Secondary | ICD-10-CM

## 2017-07-23 DIAGNOSIS — Z515 Encounter for palliative care: Secondary | ICD-10-CM

## 2017-07-23 LAB — BASIC METABOLIC PANEL
ANION GAP: 13 (ref 5–15)
BUN: 42 mg/dL — ABNORMAL HIGH (ref 6–20)
CHLORIDE: 121 mmol/L — AB (ref 101–111)
CO2: 19 mmol/L — AB (ref 22–32)
Calcium: 8.2 mg/dL — ABNORMAL LOW (ref 8.9–10.3)
Creatinine, Ser: 2.44 mg/dL — ABNORMAL HIGH (ref 0.44–1.00)
GFR calc Af Amer: 21 mL/min — ABNORMAL LOW (ref >60)
GFR calc non Af Amer: 18 mL/min — ABNORMAL LOW (ref >60)
GLUCOSE: 396 mg/dL — AB (ref 65–99)
POTASSIUM: 3.5 mmol/L (ref 3.5–5.1)
Sodium: 153 mmol/L — ABNORMAL HIGH (ref 135–145)

## 2017-07-23 LAB — CBC
HEMATOCRIT: 32.8 % — AB (ref 36.0–46.0)
HEMOGLOBIN: 10.6 g/dL — AB (ref 12.0–15.0)
MCH: 26.9 pg (ref 26.0–34.0)
MCHC: 32.3 g/dL (ref 30.0–36.0)
MCV: 83.2 fL (ref 78.0–100.0)
Platelets: 232 10*3/uL (ref 150–400)
RBC: 3.94 MIL/uL (ref 3.87–5.11)
RDW: 15.6 % — ABNORMAL HIGH (ref 11.5–15.5)
WBC: 14 10*3/uL — ABNORMAL HIGH (ref 4.0–10.5)

## 2017-07-23 LAB — GLUCOSE, CAPILLARY
GLUCOSE-CAPILLARY: 343 mg/dL — AB (ref 65–99)
GLUCOSE-CAPILLARY: 167 mg/dL — AB (ref 65–99)
GLUCOSE-CAPILLARY: 134 mg/dL — AB (ref 65–99)
Glucose-Capillary: 323 mg/dL — ABNORMAL HIGH (ref 65–99)
Glucose-Capillary: 261 mg/dL — ABNORMAL HIGH (ref 65–99)
Glucose-Capillary: 97 mg/dL (ref 65–99)

## 2017-07-23 LAB — MRSA PCR SCREENING: MRSA by PCR: NEGATIVE

## 2017-07-23 MED ORDER — INSULIN GLARGINE 100 UNIT/ML ~~LOC~~ SOLN
9.0000 [IU] | Freq: Every day | SUBCUTANEOUS | Status: DC
  Administered 2017-07-23 – 2017-07-24 (×2): 9 [IU] via SUBCUTANEOUS
  Filled 2017-07-23 (×4): qty 0.09

## 2017-07-23 MED ORDER — SODIUM BICARBONATE 8.4 % IV SOLN
INTRAVENOUS | Status: AC
  Filled 2017-07-23: qty 100

## 2017-07-23 NOTE — Consult Note (Signed)
Consultation Note Date: 07/23/2017   Patient Name: Caitlin Riddle  DOB: 1941-05-02  MRN: [id number]  Age / Sex: 76 y.o., female  PCP: Patient, No Pcp Per Referring Physician: Erick Blinks, MD  Reason for Consultation: Establishing goals of care and Psychosocial/spiritual support  HPI/Patient Profile: 76 y.o. female  with past medical history of high blood pressure and high cholesterol chronic kidney disease, dementia for approximately 5 years, SNF resident for 2 and half years, bedbound after right hip fracture with surgical repair April 2016, admitted on 07/22/2017 with sepsis.   Clinical Assessment and Goals of Care: Caitlin Riddle is resting quietly in bed. She does not make eye contact, or try to communicate with me in any way. Present at bedside today is sister/legal guardian, Caitlin Riddle, and brother Caitlin Riddle.  We talk about Caitlin Riddle health history, ALF for 2 and half years, now SNF for 2 and half years.  We talk about the pathway for dementia in detail including; functional status and normal progression, nutritional status and normal progression (currently on pured diet, fed by others), mental status and normal progression. I share a diagram of the chronic illness pathway, what is normal and expected. We also talk about the differences for those with dementia.  Family states that Caitlin Riddle worked in [employer], what gave her pleasure in life was her church,and that her normal weight was 120 to 125 pounds. We talk about how to make choices for their sister. Keeping her at the center of decision making, can we change what is happening, and what would the person she was 5 years ago say about her state now.    We talk about healthcare power of attorney Caitlin Riddle's husband (25 years younger, but now deceased) was not allowed to be her responsible party, sister Caitlin Riddle was appointed by the state, but  she leans on her brother. We talk about DNR "allow a natural death". We talk about the concept of "let nature take its course".  We also talk about hospice services. Caitlin Riddle states her concern is that if her sister does not eat or drink, and how she will be cared for if/when she returns to SNF. I share that if she cannot eat or drink, then she cannot live. We talk about PEG tube in detail, including risks and benefits, family elects NO PEG tube.    With permission we talk about prognosis. Based on outcomes. We talk about the normal changes that come as people near end of life.   We talk about labs including albumin, and blood cultures. We talk about 24 to 48 hours for  Outcomes. I share my concern about recovery. We plan for a family meeting 10/18 around noon.  Healthcare power of attorney LEGAL GUARDIAN - sister Caitlin Riddle. Advice from brother Caitlin Riddle. They also have another sister and brother living. Caitlin Riddle has a son named Caitlin Riddle.    SUMMARY OF RECOMMENDATIONS   At this point 24 to 48 hours for outcomes.  Family is considering de-escalation  of care.  Family is considering hospice.   Code Status/Advance Care Planning:  DNR  Symptom Management:   Per hospitalist, no additional needs at this time.  Palliative Prophylaxis:   Aspiration and Turn Reposition  Additional Recommendations (Limitations, Scope, Preferences):  at this point continue to treat the treatable, no CPR, no intubation, no PEG tube  Psycho-social/Spiritual:   Desire for further Chaplaincy support:no  Additional Recommendations: Caregiving  Support/Resources and Education on Hospice  Prognosis:   < 6 months, or less would not be surprising based on bedbound status, functional status, advanced dementia being fed by others.  Discharge Planning: to be determined, likely based on outcomes      Primary Diagnoses: Present on Admission: . Sepsis (HCC) . Weakness of right leg . Pressure ulcer of right  ankle . Osteoarthritis of right knee . Iron deficiency anemia . Displaced oblique fracture of shaft of humerus, left arm, initial encounter for closed fracture . Closed right hip fracture (HCC) . CKD (chronic kidney disease) stage 3, GFR 30-59 ml/min (HCC) . Alzheimer's disease . Type 2 diabetes mellitus with complication, without long-term current use of insulin (HCC) . Severe protein-calorie malnutrition (HCC) . Acute metabolic encephalopathy . AKI (acute kidney injury) (HCC) . Hypernatremia   I have reviewed the medical record, interviewed the patient and family, and examined the patient. The following aspects are pertinent.  Past Medical History:  Diagnosis Date  . Alzheimer disease   . Arthralgia   . Arthritis   . Back pain   . CKD (chronic kidney disease)    stage II  . Dehydration   . Dementia   . Diabetes mellitus   . Dysphagia   . Failure to thrive (0-17)   . Failure to thrive (0-17)   . Falls   . Humerus fracture   . Hyperlipidemia   . Hypertension   . Iron deficiency anemia   . Renal insufficiency   . Rheumatoid arthritis (HCC)   . Thyroid disease   . UTI (urinary tract infection)   . Vision loss of left eye    Social History   Social History  . Marital status: Married    Spouse name: N/A  . Number of children: N/A  . Years of education: N/A   Social History Main Topics  . Smoking status: Never Smoker  . Smokeless tobacco: Never Used  . Alcohol use No  . Drug use: No  . Sexual activity: Not Currently   Other Topics Concern  . None   Social History Narrative  . None   History reviewed. No pertinent family history. Scheduled Meds: . ceFEPime (MAXIPIME) IV  500 mg Intravenous Q24H  . heparin  5,000 Units Subcutaneous Q8H  . Influenza vac split quadrivalent PF  0.5 mL Intramuscular Tomorrow-1000  . insulin aspart  0-9 Units Subcutaneous Q4H  . insulin glargine  9 Units Subcutaneous Daily  . levothyroxine  12.5 mcg Intravenous Daily    Continuous Infusions: .  sodium bicarbonate  infusion 1000 mL 100 mL/hr at 07/23/17 0653   PRN Meds:.acetaminophen **OR** acetaminophen, ondansetron **OR** ondansetron (ZOFRAN) IV Medications Prior to Admission:  Prior to Admission medications   Medication Sig Start Date End Date Taking? Authorizing Provider  acetaminophen (TYLENOL) 325 MG tablet Take 650 mg by mouth every 4 (four) hours as needed for mild pain or moderate pain.   Yes [provider]  allopurinol (ZYLOPRIM) 100 MG tablet Take 100 mg by mouth daily.   Yes [provider]  Amino  Acids-Protein Hydrolys (FEEDING SUPPLEMENT, PRO-STAT SUGAR FREE 64,) LIQD Take 30 mLs by mouth 2 (two) times daily.   Yes [provider]  Cholecalciferol (VITAMIN D3) 5000 UNITS CAPS Take 5,000 Units by mouth daily.   Yes [provider]  colchicine 0.6 MG tablet Take 0.3 mg by mouth daily.   Yes [provider]  HYDROcodone-acetaminophen (NORCO) 5-325 MG tablet Take 1 tablet by mouth every 6 (six) hours as needed for moderate pain. 07/08/17  Yes Doug Sou, MD  insulin lispro (HUMALOG KWIKPEN) 100 UNIT/ML KiwkPen Inject 2-14 Units into the skin 3 (three) times daily before meals. 150-200= 2 unit 201-250= 4 unit 251-300= 6 unit 301-350= 8 unit 351-400=14unit   Yes [provider]  levothyroxine (SYNTHROID, LEVOTHROID) 25 MCG tablet Take 1 tablet (25 mcg total) by mouth daily before breakfast. 12/28/13  Yes Luking, Jonna Coup, MD  memantine (NAMENDA) 5 MG tablet Take 1 tablet (5 mg total) by mouth 2 (two) times daily. 11/29/13  Yes Babs Sciara, MD  metoprolol succinate (TOPROL-XL) 25 MG 24 hr tablet Take 0.5 tablets (12.5 mg total) by mouth daily. 01/13/15  Yes Black, Lesle Chris, NP  mirtazapine (REMERON) 7.5 MG tablet Take 7.5 mg by mouth at bedtime.   Yes [provider]  ondansetron (ZOFRAN) 4 MG tablet Take 4 mg by mouth every 6 (six) hours as needed for nausea or vomiting.   Yes  [provider]  pravastatin (PRAVACHOL) 20 MG tablet TAKE 1 TABLET BY MOUTH ONCE DAILY. 10/25/14  Yes Merlyn Albert, MD  Propylene Glycol (SYSTANE BALANCE OP) Apply 1 drop to eye every 8 (eight) hours as needed (for dry eyes).    Yes [provider]   No Known Allergies Review of Systems  Unable to perform ROS: Dementia    Physical Exam  Constitutional: No distress.  Does not make eye contact, frail, acutely ill  HENT:  Riddle: Atraumatic.  Temporal wasting  Cardiovascular: Normal rate.   Pulmonary/Chest: Effort normal. No respiratory distress.  Abdominal: Soft. She exhibits no distension.  Musculoskeletal: She exhibits no edema.  Neurological:  Does not make eye contact, known dementia  Skin: Skin is warm and dry.  Bedsore on ankle  Nursing note and vitals reviewed.   Vital Signs: BP 130/72   Pulse (!) 103   Temp 99.9 F (37.7 C) (Axillary)   Resp 18   Ht 5\' 6"  (1.676 m)   Wt 45.2 kg (99 lb 10.4 oz)   SpO2 100%   BMI 16.08 kg/m  Pain Assessment: PAINAD   Pain Riddle: 2    SpO2: SpO2: 100 % O2 Device:SpO2: 100 % O2 Flow Rate: .   IO: Intake/output summary:  Intake/Output Summary (Last 24 hours) at 07/23/17 1455 Last data filed at 07/23/17 0000  Gross per 24 hour  Intake          3663.33 ml  Output              100 ml  Net          3563.33 ml    LBM:   Baseline Weight: Weight: 40.8 kg (90 lb) Most recent weight: Weight: 45.2 kg (99 lb 10.4 oz)     Palliative Assessment/Data:   Flowsheet Rows     Most Recent Value  Intake Tab  Referral Department  Hospitalist  Unit at Time of Referral  ICU  Palliative Care Primary Diagnosis  Sepsis/Infectious Disease  Date Notified  07/22/17  Palliative Care Type  New Palliative care  Reason for referral  Clarify Goals of Care  Date of Admission  07/22/17  Date first seen by Palliative Care  07/23/17  # of days Palliative referral response time  1 Day(s)  # of days IP prior to Palliative  referral  0  Clinical Assessment  Palliative Performance Scale Riddle  20%  Pain Max last 24 hours  Not able to report  Pain Min Last 24 hours  Not able to report  Dyspnea Max Last 24 Hours  Not able to report  Dyspnea Min Last 24 hours  Not able to report  Psychosocial & Spiritual Assessment  Palliative Care Outcomes  Patient/Family meeting held?  Yes  Who was at the meeting?  legal guardian, sister Caitlin Riddle and brother Caitlin Riddle at bed  Palliative Care Outcomes  Counseled regarding hospice, Provided psychosocial or spiritual support, Clarified goals of care  Patient/Family wishes: Interventions discontinued/not started   Mechanical Ventilation, PEG      Time In: 1315 Time Out: 1430 Time Total: 75 minutes Greater than 50%  of this time was spent counseling and coordinating care related to the above assessment and plan.  Signed by: Katheran Awe, NP   Please contact Palliative Medicine Team phone at [phone] for questions and concerns.  For individual provider: See Loretha Stapler

## 2017-07-23 NOTE — Progress Notes (Signed)
PROGRESS NOTE    Caitlin Riddle  TJQ:[id number] DOB: 05/06/1941 DOA: 07/22/2017 PCP: Patient, No Pcp Per    Brief Narrative: 76 y/o female with multiple medical problems and advanced dementia, who is bedbound at baseline, presented with worsening mental status. Found to be dehydrated, hypernatremic with possible sepsis and UTI. Started on IV fluids and antibiotics. She is on hydration. Palliative care consulted for goals of care.   Assessment & Plan:   Principal Problem:   Sepsis (HCC) Active Problems:   Iron deficiency anemia   Alzheimer's disease   Weakness of right leg   Osteoarthritis of right knee   Pressure ulcer of right ankle   Closed right hip fracture (HCC)   CKD (chronic kidney disease) stage 3, GFR 30-59 ml/min (HCC)   Displaced oblique fracture of shaft of humerus, left arm, initial encounter for closed fracture   Type 2 diabetes mellitus with complication, without long-term current use of insulin (HCC)   Severe protein-calorie malnutrition (HCC)   Acute metabolic encephalopathy   AKI (acute kidney injury) (HCC)   Hypernatremia   1. Sepsis. Possibly secondary to urinary tract infection. She was started on sepsis protocol with IV hydration and antibiotics. Follow-up urine cultures. Hemodynamics are stable. 2. Acute metabolic encephalopathy. Possibly related to sepsis and dehydration. Patient has advanced dementia at baseline.  3. Acute kidney injury on chronic kidney disease stage III. Related to dehydration. Baseline creatinine appears to be around 1.4. Improving with IV fluids. 4. Hypernatremia. Likely related to dehydration and poor by mouth intake. Started on bicarbonate infusion. Slowly improving. Continue to monitor. 5. Displaced oblique fracture of shaft of left humerus. Seen by orthopedics as an outpatient and recommendations were for nonoperative management. Arm was placed in a brace. She will follow with orthopedics as an outpatient. 6. Severe protein  calorie malnutrition. Nutrition consult. 7. Diabetes. Start on Lantus due to elevated blood sugars. Continue sliding scale insulin. 8. Iron deficiency anemia. Hemoglobin stable. Continue to monitor 9. Advanced Alzheimer's dementia.at baseline, patient is bedbound. She often does not recognize family members and has poor interaction 10. History of right hip fracture. Underwent operative repair in April 2016. Has been bedbound and nonambulatory for several months. 11. Hypothyroidism. Continue on Synthroid. TSH is normal. 12. Goals of care. Given her multiple medical problems, severe malnutrition and advanced dementia as well as failure to thrive, her long-term prognosis is poor. Palliative care has been consulted to address goals of care.   DVT prophylaxis: heparin Code Status: DNR Family Communication: no family present Disposition Plan: return to SNF when ready   Consultants:   Palliative care  Procedures:     Antimicrobials:   Cefepime 10/16>   Subjective: Awake, does not answer questions  Objective: Vitals:   07/23/17 0747 07/23/17 0800 07/23/17 0900 07/23/17 0921  BP: (!) 141/80 (!) 151/80 (!) 153/78   Pulse: (!) 110 (!) 114 (!) 117   Resp: 19 (!) 21 20   Temp: (!) 100.7 F (38.2 C)   99.9 F (37.7 C)  TempSrc: Axillary   Axillary  SpO2: 100% 100% 100%   Weight:      Height:        Intake/Output Summary (Last 24 hours) at 07/23/17 1002 Last data filed at 07/23/17 0000  Gross per 24 hour  Intake          3663.33 ml  Output              100 ml  Net  3563.33 ml   Filed Weights   07/22/17 1401 07/22/17 2047  Weight: 40.8 kg (90 lb) 45.2 kg (99 lb 10.4 oz)    Examination:  General exam: Appears calm and comfortable  Respiratory system: Clear to auscultation. Respiratory effort normal. Cardiovascular system: S1 & S2 heard, tachycardic. No JVD, murmurs, rubs, gallops or clicks. No pedal edema. Gastrointestinal system: Abdomen is nondistended, soft  and nontender. No organomegaly or masses felt. Normal bowel sounds heard. Central nervous system: No focal neurological deficits. Extremities: Symmetric 5 x 5 power. Skin: No rashes, lesions or ulcers Psychiatry: nonverbal     Data Reviewed: I have personally reviewed following labs and imaging studies  CBC:  Recent Labs Lab 07/22/17 1410 07/23/17 0521  WBC 13.8* 14.0*  NEUTROABS 9.9*  --   HGB 11.5* 10.6*  HCT 36.0 32.8*  MCV 84.1 83.2  PLT 304 232   Basic Metabolic Panel:  Recent Labs Lab 07/22/17 1410 07/23/17 0521  NA 158* 153*  K 4.5 3.5  CL 123* 121*  CO2 16* 19*  GLUCOSE 212* 396*  BUN 58* 42*  CREATININE 3.30* 2.44*  CALCIUM 9.8 8.2*   GFR: Estimated Creatinine Clearance: 14.4 mL/min (A) (by C-G formula based on SCr of 2.44 mg/dL (H)). Liver Function Tests:  Recent Labs Lab 07/22/17 1410  AST 26  ALT 15  ALKPHOS 135*  BILITOT 1.0  PROT 8.7*  ALBUMIN 3.4*   No results for input(s): LIPASE, AMYLASE in the last 168 hours. No results for input(s): AMMONIA in the last 168 hours. Coagulation Profile: No results for input(s): INR, PROTIME in the last 168 hours. Cardiac Enzymes: No results for input(s): CKTOTAL, CKMB, CKMBINDEX, TROPONINI in the last 168 hours. BNP (last 3 results) No results for input(s): PROBNP in the last 8760 hours. HbA1C: No results for input(s): HGBA1C in the last 72 hours. CBG:  Recent Labs Lab 07/23/17 0648 07/23/17 0746 07/23/17 0914  GLUCAP 343* 323* 261*   Lipid Profile: No results for input(s): CHOL, HDL, LDLCALC, TRIG, CHOLHDL, LDLDIRECT in the last 72 hours. Thyroid Function Tests:  Recent Labs  07/22/17 1815  TSH 1.552   Anemia Panel: No results for input(s): VITAMINB12, FOLATE, FERRITIN, TIBC, IRON, RETICCTPCT in the last 72 hours. Sepsis Labs:  Recent Labs Lab 07/22/17 1418 07/22/17 1703  LATICACIDVEN 2.9* 3.9*    Recent Results (from the past 240 hour(s))  Blood Culture (routine x 2)      Status: None (Preliminary result)   Collection Time: 07/22/17  2:22 PM  Result Value Ref Range Status   Specimen Description BLOOD RIGHT FOREARM  Final   Special Requests   Final    BOTTLES DRAWN AEROBIC AND ANAEROBIC Blood Culture results may not be optimal due to an inadequate volume of blood received in culture bottles   Culture NO GROWTH < 24 HOURS  Final   Report Status PENDING  Incomplete  Blood Culture (routine x 2)     Status: None (Preliminary result)   Collection Time: 07/22/17  2:30 PM  Result Value Ref Range Status   Specimen Description BLOOD RIGHT WRIST  Final   Special Requests   Final    BOTTLES DRAWN AEROBIC AND ANAEROBIC Blood Culture results may not be optimal due to an inadequate volume of blood received in culture bottles   Culture NO GROWTH < 24 HOURS  Final   Report Status PENDING  Incomplete  MRSA PCR Screening     Status: None   Collection Time: 07/22/17  9:30  PM  Result Value Ref Range Status   MRSA by PCR NEGATIVE NEGATIVE Final    Comment:        The GeneXpert MRSA Assay (FDA approved for NASAL specimens only), is one component of a comprehensive MRSA colonization surveillance program. It is not intended to diagnose MRSA infection nor to guide or monitor treatment for MRSA infections.          Radiology Studies: Dg Chest Portable 1 View  Result Date: 07/22/2017 CLINICAL DATA:  Fever, confusion, hypertension, diabetes mellitus, Alzheimer's, question pneumonia EXAM: PORTABLE CHEST 1 VIEW COMPARISON:  Portable exam 1545 hours compared to 01/10/2015 FINDINGS: Normal heart size, mediastinal contours, and pulmonary vascularity. Minimal LEFT basilar atelectasis. Lungs otherwise clear. No infiltrate, pleural effusion or pneumothorax. Diffuse osseous demineralization. Comminuted displaced and angulated mid LEFT humeral diaphyseal fracture. IMPRESSION: Minimal LEFT basilar atelectasis. Comminuted displaced and angulated LEFT humeral diaphyseal fracture.  Electronically Signed   By: Ulyses Southward M.D.   On: 07/22/2017 16:01        Scheduled Meds: . ceFEPime (MAXIPIME) IV  500 mg Intravenous Q24H  . heparin  5,000 Units Subcutaneous Q8H  . Influenza vac split quadrivalent PF  0.5 mL Intramuscular Tomorrow-1000  . insulin aspart  0-9 Units Subcutaneous Q4H  . levothyroxine  12.5 mcg Intravenous Daily   Continuous Infusions: .  sodium bicarbonate  infusion 1000 mL 100 mL/hr at 07/23/17 0653     LOS: 1 day    Time spent:    Cedric Mcclaine, MD [facility] Pager [phone]  If 7PM-7AM, please contact night-coverage [email or web address] Password [email or web address] 07/23/2017, 10:02 AM

## 2017-07-23 NOTE — Progress Notes (Signed)
Inpatient Diabetes Program Recommendations  AACE/ADA: New Consensus Statement on Inpatient Glycemic Control (2015)  Target Ranges:  Prepandial:   less than 140 mg/dL      Peak postprandial:   less than 180 mg/dL (1-2 hours)      Critically ill patients:  140 - 180 mg/dL   Results for Caitlin, Riddle (MRN [id number]) as of 07/23/2017 08:35  Ref. Range 07/23/2017 06:48 07/23/2017 07:46  Glucose-Capillary Latest Ref Range: 65 - 99 mg/dL 093 (H) 818 (H)   Review of Glycemic Control  Diabetes history: DM2 Outpatient Diabetes medications: Humalog 2-14 units TID with meals Current orders for Inpatient glycemic control: Novolog 0-9 units Q4H  Inpatient Diabetes Program Recommendations: Insulin - Basal: Please consider ordering Lantus 9 units Q24H starting now (based on 45.2 kg x 0.2 units).  Thanks, Orlando Penner, RN, MSN, CDE Diabetes Coordinator Inpatient Diabetes Program [phone] (Team Pager from 8am to 5pm)

## 2017-07-23 NOTE — Progress Notes (Signed)
Patient transferred in bed to medical surgical unit in stable condition. IV patent and infusing

## 2017-07-24 ENCOUNTER — Inpatient Hospital Stay (HOSPITAL_COMMUNITY)
Admission: EM | Admit: 2017-07-24 | Discharge: 2017-07-27 | DRG: 871 | Disposition: A | Discharge status: Hospice | Source: Ambulatory Visit | Attending: Internal Medicine | Admitting: Internal Medicine

## 2017-07-24 DIAGNOSIS — H5462 Unqualified visual loss, left eye, normal vision right eye: Secondary | ICD-10-CM | POA: Diagnosis present

## 2017-07-24 DIAGNOSIS — B952 Enterococcus as the cause of diseases classified elsewhere: Secondary | ICD-10-CM | POA: Diagnosis present

## 2017-07-24 DIAGNOSIS — G9341 Metabolic encephalopathy: Secondary | ICD-10-CM | POA: Diagnosis present

## 2017-07-24 DIAGNOSIS — A419 Sepsis, unspecified organism: Secondary | ICD-10-CM | POA: Diagnosis not present

## 2017-07-24 DIAGNOSIS — Z7189 Other specified counseling: Secondary | ICD-10-CM

## 2017-07-24 DIAGNOSIS — G309 Alzheimer's disease, unspecified: Secondary | ICD-10-CM | POA: Diagnosis not present

## 2017-07-24 DIAGNOSIS — E1165 Type 2 diabetes mellitus with hyperglycemia: Secondary | ICD-10-CM | POA: Diagnosis not present

## 2017-07-24 DIAGNOSIS — A4181 Sepsis due to Enterococcus: Secondary | ICD-10-CM | POA: Diagnosis not present

## 2017-07-24 DIAGNOSIS — E860 Dehydration: Secondary | ICD-10-CM | POA: Diagnosis present

## 2017-07-24 DIAGNOSIS — M069 Rheumatoid arthritis, unspecified: Secondary | ICD-10-CM | POA: Diagnosis present

## 2017-07-24 DIAGNOSIS — Z681 Body mass index (BMI) 19 or less, adult: Secondary | ICD-10-CM | POA: Diagnosis not present

## 2017-07-24 DIAGNOSIS — E870 Hyperosmolality and hypernatremia: Secondary | ICD-10-CM | POA: Diagnosis not present

## 2017-07-24 DIAGNOSIS — R0682 Tachypnea, not elsewhere classified: Secondary | ICD-10-CM | POA: Diagnosis not present

## 2017-07-24 DIAGNOSIS — I129 Hypertensive chronic kidney disease with stage 1 through stage 4 chronic kidney disease, or unspecified chronic kidney disease: Secondary | ICD-10-CM | POA: Diagnosis not present

## 2017-07-24 DIAGNOSIS — Z66 Do not resuscitate: Secondary | ICD-10-CM | POA: Diagnosis present

## 2017-07-24 DIAGNOSIS — L89519 Pressure ulcer of right ankle, unspecified stage: Secondary | ICD-10-CM | POA: Diagnosis not present

## 2017-07-24 DIAGNOSIS — N183 Chronic kidney disease, stage 3 unspecified: Secondary | ICD-10-CM | POA: Diagnosis present

## 2017-07-24 DIAGNOSIS — E118 Type 2 diabetes mellitus with unspecified complications: Secondary | ICD-10-CM | POA: Diagnosis present

## 2017-07-24 DIAGNOSIS — R627 Adult failure to thrive: Secondary | ICD-10-CM | POA: Diagnosis not present

## 2017-07-24 DIAGNOSIS — R64 Cachexia: Secondary | ICD-10-CM | POA: Diagnosis not present

## 2017-07-24 DIAGNOSIS — F0280 Dementia in other diseases classified elsewhere without behavioral disturbance: Secondary | ICD-10-CM | POA: Diagnosis present

## 2017-07-24 DIAGNOSIS — Z515 Encounter for palliative care: Secondary | ICD-10-CM | POA: Diagnosis not present

## 2017-07-24 DIAGNOSIS — E872 Acidosis: Secondary | ICD-10-CM | POA: Diagnosis not present

## 2017-07-24 DIAGNOSIS — N179 Acute kidney failure, unspecified: Secondary | ICD-10-CM | POA: Diagnosis not present

## 2017-07-24 DIAGNOSIS — Z7409 Other reduced mobility: Secondary | ICD-10-CM | POA: Diagnosis present

## 2017-07-24 DIAGNOSIS — E1122 Type 2 diabetes mellitus with diabetic chronic kidney disease: Secondary | ICD-10-CM | POA: Diagnosis not present

## 2017-07-24 DIAGNOSIS — Z23 Encounter for immunization: Secondary | ICD-10-CM | POA: Diagnosis not present

## 2017-07-24 DIAGNOSIS — N390 Urinary tract infection, site not specified: Secondary | ICD-10-CM | POA: Diagnosis not present

## 2017-07-24 DIAGNOSIS — W19XXXD Unspecified fall, subsequent encounter: Secondary | ICD-10-CM | POA: Diagnosis present

## 2017-07-24 DIAGNOSIS — E871 Hypo-osmolality and hyponatremia: Secondary | ICD-10-CM | POA: Diagnosis not present

## 2017-07-24 DIAGNOSIS — D509 Iron deficiency anemia, unspecified: Secondary | ICD-10-CM | POA: Diagnosis not present

## 2017-07-24 DIAGNOSIS — E43 Unspecified severe protein-calorie malnutrition: Secondary | ICD-10-CM | POA: Diagnosis not present

## 2017-07-24 LAB — GLUCOSE, CAPILLARY
GLUCOSE-CAPILLARY: 123 mg/dL — AB (ref 65–99)
GLUCOSE-CAPILLARY: 110 mg/dL — AB (ref 65–99)
GLUCOSE-CAPILLARY: 123 mg/dL — AB (ref 65–99)
Glucose-Capillary: 164 mg/dL — ABNORMAL HIGH (ref 65–99)

## 2017-07-24 LAB — CBC
HEMATOCRIT: 31.7 % — AB (ref 36.0–46.0)
Hemoglobin: 10.2 g/dL — ABNORMAL LOW (ref 12.0–15.0)
MCH: 26.8 pg (ref 26.0–34.0)
MCHC: 32.2 g/dL (ref 30.0–36.0)
MCV: 83.2 fL (ref 78.0–100.0)
PLATELETS: 201 10*3/uL (ref 150–400)
RBC: 3.81 MIL/uL — ABNORMAL LOW (ref 3.87–5.11)
RDW: 15.1 % (ref 11.5–15.5)
WBC: 18.1 10*3/uL — ABNORMAL HIGH (ref 4.0–10.5)

## 2017-07-24 LAB — BASIC METABOLIC PANEL
Anion gap: 14 (ref 5–15)
BUN: 28 mg/dL — AB (ref 6–20)
CHLORIDE: 110 mmol/L (ref 101–111)
CO2: 30 mmol/L (ref 22–32)
CREATININE: 2.18 mg/dL — AB (ref 0.44–1.00)
Calcium: 8.3 mg/dL — ABNORMAL LOW (ref 8.9–10.3)
GFR calc Af Amer: 24 mL/min — ABNORMAL LOW (ref >60)
GFR calc non Af Amer: 21 mL/min — ABNORMAL LOW (ref >60)
Glucose, Bld: 100 mg/dL — ABNORMAL HIGH (ref 65–99)
POTASSIUM: 2.8 mmol/L — AB (ref 3.5–5.1)
Sodium: 154 mmol/L — ABNORMAL HIGH (ref 135–145)

## 2017-07-24 MED ORDER — MORPHINE SULFATE (CONCENTRATE) 10 MG/0.5ML PO SOLN: 10.0000 mg | ORAL | Status: DC | PRN

## 2017-07-24 MED ORDER — MORPHINE SULFATE (CONCENTRATE) 10 MG/0.5ML PO SOLN: 5.0000 mg | ORAL | Status: DC | PRN

## 2017-07-24 MED ORDER — SODIUM BICARBONATE 8.4 % IV SOLN
INTRAVENOUS | Status: AC
  Filled 2017-07-24: qty 100

## 2017-07-24 MED ORDER — POLYVINYL ALCOHOL 1.4 % OP SOLN: 2.0000 [drp] | OPHTHALMIC | Status: DC | PRN

## 2017-07-24 MED ORDER — LORAZEPAM 0.5 MG PO TABS: 0.5000 mg | ORAL_TABLET | ORAL | Status: DC | PRN

## 2017-07-24 NOTE — Progress Notes (Signed)
Daily Progress Note   Patient Name: [place]       Date: 07/24/2017 DOB: 01/03/1941  Age: 76 y.o. MRN#: [id number] Attending Physician: Erick Blinks, MD Primary Care Physician: Patient, No Pcp Per Admit Date: 07/22/2017  Reason for Consultation/Follow-up: Establishing goals of care, Inpatient hospice referral and Psychosocial/spiritual support  Subjective: Mrs. Howze is resting quietly in bed. She does not respond to voice or touch.Present at bedside is sister/legal guardian Cecile Sheerer and brother Marquette Saa [phone]. We talk about Mrs. Vanmetre increased white blood , her lack of alertness,and therefore inability to eat or drink, and her general frail state.  After discussion, sister/legal guardian Cecile Sheerer and brother Marquette Saa elect for transition to comfort measures only, request transition to hospice home of [facility] We discuss comfort measures in detail and I share that hospice home will not provide IV fluids or antibiotics, no medications that are not focused on comfort. Family agrees states understanding.  Hospice please call brother Marquette Saa at [phone].   Length of Stay: 2  Current Medications: Scheduled Meds:    Continuous Infusions:   PRN Meds: acetaminophen **OR** acetaminophen, morphine CONCENTRATE, ondansetron **OR** ondansetron (ZOFRAN) IV  Physical Exam  Constitutional: No distress.  Appears frail, acutely/chronically ill  HENT:  Head: Atraumatic.  Temporal wasting  Cardiovascular: Normal rate and regular rhythm.   Pulmonary/Chest: Effort normal. No respiratory distress.  Abdominal: Soft. She exhibits no distension.  Musculoskeletal: She exhibits no edema.  Neurological:  Does not open eyes to voice or touch    Skin: Skin is warm and dry.  Nursing note and vitals reviewed.           Vital Signs: BP (!) 103/54 (BP Location: Left Arm)   Pulse 91   Temp 99.5 F (37.5 C) (Axillary)   Resp 16   Ht 5\' 6"  (1.676 m)   Wt 45.2 kg (99 lb 10.4 oz)   SpO2 100%   BMI 16.08 kg/m  SpO2: SpO2: 100 % O2 Device: O2 Device: Not Delivered O2 Flow Rate:    Intake/output summary:  Intake/Output Summary (Last 24 hours) at 07/24/17 1417 Last data filed at 07/24/17 0832  Gross per 24 hour  Intake          2748.33 ml  Output  400 ml  Net          2348.33 ml   LBM:   Baseline Weight: Weight: 40.8 kg (90 lb) Most recent weight: Weight: 45.2 kg (99 lb 10.4 oz)       Palliative Assessment/Data:    Flowsheet Rows     Most Recent Value  Intake Tab  Referral Department  Hospitalist  Unit at Time of Referral  ICU  Palliative Care Primary Diagnosis  Sepsis/Infectious Disease  Date Notified  07/22/17  Palliative Care Type  New Palliative care  Reason for referral  Clarify Goals of Care  Date of Admission  07/22/17  Date first seen by Palliative Care  07/23/17  # of days Palliative referral response time  1 Day(s)  # of days IP prior to Palliative referral  0  Clinical Assessment  Palliative Performance Scale Score  20%  Pain Max last 24 hours  Not able to report  Pain Min Last 24 hours  Not able to report  Dyspnea Max Last 24 Hours  Not able to report  Dyspnea Min Last 24 hours  Not able to report  Psychosocial & Spiritual Assessment  Palliative Care Outcomes  Patient/Family meeting held?  Yes  Who was at the meeting?  legal guardian, sister Cecile Sheerer and brother Marquette Saa at bed  Palliative Care Outcomes  Counseled regarding hospice, Provided psychosocial or spiritual support, Clarified goals of care  Patient/Family wishes: Interventions discontinued/not started   Mechanical Ventilation, PEG      Patient Active Problem List   Diagnosis Date Noted  . AKI (acute kidney  injury) (HCC) 07/23/2017  . Hypernatremia 07/23/2017  . Goals of care, counseling/discussion   . Palliative care encounter   . Encounter for hospice care discussion   . Sepsis (HCC) 07/22/2017  . Type 2 diabetes mellitus with complication, without long-term current use of insulin (HCC) 07/22/2017  . Severe protein-calorie malnutrition (HCC) 07/22/2017  . Acute metabolic encephalopathy 07/22/2017  . Displaced oblique fracture of shaft of humerus, left arm, initial encounter for closed fracture 07/15/2017  . Intertrochanteric fracture of right femur (HCC) 01/11/2015  . Fall   . Closed right hip fracture (HCC) 01/10/2015  . CKD (chronic kidney disease) stage 3, GFR 30-59 ml/min (HCC) 01/10/2015  . Osteoporosis 09/14/2014  . Hyperlipidemia 07/28/2014  . Pressure ulcer of right ankle 11/29/2013  . Hypothyroidism 10/17/2013  . Weakness of right leg 09/16/2013  . Osteoarthritis of right knee 09/16/2013  . Alzheimer's disease 08/19/2013  . Type 2 diabetes mellitus with HbA1C goal below 7.5 07/14/2013  . Loss of weight 07/14/2013  . Urinary tract infection without hematuria 12/11/2011  . Arthritis of knee, right 12/11/2011  . Fever 12/11/2011  . Leukocytosis 12/11/2011  . Rheumatoid arthritis (HCC) 12/11/2011  . Iron deficiency anemia 05/22/2009  . HELICOBACTER PYLORI GASTRITIS 05/17/2009  . COLONOSCOPY, HX OF 04/20/2009  . KNEE PAIN 05/03/2008    Palliative Care Assessment & Plan   Patient Profile:  76 y.o. female  with past medical history of high blood pressure and high cholesterol chronic kidney disease, dementia for approximately 5 years, SNF resident for 2 and half years, bedbound after right hip fracture with surgical repair April 2016, admitted on 07/22/2017 with sepsis.   Assessment: Sepsis: treated with IV antibiotics, but  Continues to have increasing white blood cells. Legal guardian and family are requesting comfort and dignity, let nature take its course, admission to  hospice home of [facility].  Recommendations/Plan: Legal guardian and family  are requesting comfort and dignity, let nature take its course, admission to hospice home of [facility].    Goals of Care and Additional Recommendations:  Limitations on Scope of Treatment: Full Comfort Care  Code Status:    Code Status Orders        Start     Ordered   07/22/17 2031  Do not attempt resuscitation (DNR)  Continuous    Question Answer Comment  In the event of cardiac or respiratory ARREST Do not call a "code blue"   In the event of cardiac or respiratory ARREST Do not perform Intubation, CPR, defibrillation or ACLS   In the event of cardiac or respiratory ARREST Use medication by any route, position, wound care, and other measures to relive pain and suffering. May use oxygen, suction and manual treatment of airway obstruction as needed for comfort.      07/22/17 2030    Code Status History    Date Active Date Inactive Code Status Order ID Comments User Context   07/22/2017  2:20 PM 07/22/2017  8:30 PM DNR [id number]  Marily Memos, MD ED   01/11/2015 10:36 AM 01/13/2015  5:46 PM Full Code [id number]  Vickki Hearing, MD Inpatient   01/10/2015  5:08 PM 01/11/2015 10:36 AM Full Code [id number]  Gwenyth Bender, NP Inpatient    Advance Directive Documentation     Most Recent Value  Type of Advance Directive  Healthcare Power of Attorney [Sister Gladys Swann]  Pre-existing out of facility DNR order (yellow form or pink MOST form)  -  "MOST" Form in Place?  -       Prognosis:   < 2 weeks  Discharge Planning:  Legal guardian and family are requesting comfort and dignity, let nature take its course, admission to hospice home of [facility].  Care plan was discussed with nursing staff, social worker, and Dr. Kerry Hough.   Thank you for allowing the Palliative Medicine Team to assist in the care of this patient.   Time In: 1140 Time Out: 1220 Total Time 40 minutes Prolonged  Time Billed  no       Greater than 50%  of this time was spent counseling and coordinating care related to the above assessment and plan.  Katheran Awe, NP  Please contact Palliative Medicine Team phone at [phone] for questions and concerns.

## 2017-07-24 NOTE — Progress Notes (Signed)
PROGRESS NOTE    Caitlin Riddle  UEK:[id number] DOB: 1940/11/20 DOA: 07/22/2017 PCP: Patient, No Pcp Per    Brief Narrative: 76 y/o female with multiple medical problems and advanced dementia, who is bedbound at baseline, presented with worsening mental status. Found to be dehydrated, hypernatremic with possible sepsis and UTI. Started on IV fluids and antibiotics. Palliative care met with family and they have elected to pursue comfort care at residential hospice   Assessment & Plan:   Principal Problem:   Sepsis ([place]) Active Problems:   Iron deficiency anemia   Alzheimer's disease   Weakness of right leg   Osteoarthritis of right knee   Pressure ulcer of right ankle   Closed right hip fracture (HCC)   CKD (chronic kidney disease) stage 3, GFR 30-59 ml/min (HCC)   Displaced oblique fracture of shaft of humerus, left arm, initial encounter for closed fracture   Type 2 diabetes mellitus with complication, without long-term current use of insulin (HCC)   Severe protein-calorie malnutrition ([place])   Acute metabolic encephalopathy   AKI (acute kidney injury) ([place])   Hypernatremia   Goals of care, counseling/discussion   Palliative care encounter   Encounter for hospice care discussion   1. Sepsis. Possibly secondary to urinary tract infection. She was started on sepsis protocol with IV hydration and antibiotics. Follow-up urine cultures. Hemodynamics are stable. 2. Acute metabolic encephalopathy. Possibly related to sepsis and dehydration. Patient has advanced dementia at baseline.  3. Acute kidney injury on chronic kidney disease stage III. Related to dehydration. Baseline creatinine appears to be around 1.4. Improving with IV fluids. 4. Hypernatremia. Likely related to dehydration and poor by mouth intake. Started on bicarbonate infusion.  5. Displaced oblique fracture of shaft of left humerus. Seen by orthopedics as an outpatient and recommendations were for nonoperative  management. Arm was placed in a brace.  6. Severe protein calorie malnutrition. Nutrition consult. 7. Diabetes. Blood sugars have been stable. 8. Iron deficiency anemia. Hemoglobin stable.  9. Advanced Alzheimer's dementia.at baseline, patient is bedbound. She often does not recognize family members and has poor interaction 10. History of right hip fracture. Underwent operative repair in April 2016. Has been bedbound and nonambulatory for several months. 11. Hypothyroidism. TSH is normal. 12. Goals of care. Given her multiple medical problems, severe malnutrition and advanced dementia as well as failure to thrive, her long-term prognosis is poor. Palliative care met with family and they have elected to pursue comfort measures at residential hospice. Social work assisting with referral.   DVT prophylaxis: none Code Status: DNR, comfort care Family Communication: no family present Disposition Plan: family has chosen residential hospice. Social work assisting with placement    Consultants:   Palliative care  Procedures:     Antimicrobials:   Cefepime 10/16>10/18   Subjective: Lethargic, does not open eyes to voice  Objective: Vitals:   07/23/17 2115 07/24/17 0650 07/24/17 1032 07/24/17 1321  BP: (!) 157/80 (!) 125/38  (!) 103/54  Pulse: (!) 106 (!) 146  91  Resp: 15 18  16   Temp: 99.6 F (37.6 C) (!) 100.7 F (38.2 C) 99.3 F (37.4 C) 99.5 F (37.5 C)  TempSrc: Axillary Axillary Axillary Axillary  SpO2: 96% 99%  100%  Weight:      Height:        Intake/Output Summary (Last 24 hours) at 07/24/17 1339 Last data filed at 07/24/17 1505  Gross per 24 hour  Intake  2748.33 ml  Output              400 ml  Net          2348.33 ml   Filed Weights   07/22/17 1401 07/22/17 2047  Weight: 40.8 kg (90 lb) 45.2 kg (99 lb 10.4 oz)    Examination:  General exam: Appears calm and comfortable  Respiratory system: Clear to auscultation. Respiratory effort  normal. Cardiovascular system: S1 & S2 heard, tachycardic. No JVD, murmurs, rubs, gallops or clicks. No pedal edema. Gastrointestinal system: Abdomen is nondistended, soft and nontender. No organomegaly or masses felt. Normal bowel sounds heard. Central nervous system: No focal neurological deficits. Extremities: Symmetric 5 x 5 power. Skin: No rashes, lesions or ulcers Psychiatry: nonverbal, lethargic     Data Reviewed: I have personally reviewed following labs and imaging studies  CBC:  Recent Labs Lab 07/22/17 1410 07/23/17 0521 07/24/17 0407  WBC 13.8* 14.0* 18.1*  NEUTROABS 9.9*  --   --   HGB 11.5* 10.6* 10.2*  HCT 36.0 32.8* 31.7*  MCV 84.1 83.2 83.2  PLT 304 232 353   Basic Metabolic Panel:  Recent Labs Lab 07/22/17 1410 07/23/17 0521 07/24/17 0407  NA 158* 153* 154*  K 4.5 3.5 2.8*  CL 123* 121* 110  CO2 16* 19* 30  GLUCOSE 212* 396* 100*  BUN 58* 42* 28*  CREATININE 3.30* 2.44* 2.18*  CALCIUM 9.8 8.2* 8.3*   GFR: Estimated Creatinine Clearance: 16.2 mL/min (A) (by C-G formula based on SCr of 2.18 mg/dL (H)). Liver Function Tests:  Recent Labs Lab 07/22/17 1410  AST 26  ALT 15  ALKPHOS 135*  BILITOT 1.0  PROT 8.7*  ALBUMIN 3.4*   No results for input(s): LIPASE, AMYLASE in the last 168 hours. No results for input(s): AMMONIA in the last 168 hours. Coagulation Profile: No results for input(s): INR, PROTIME in the last 168 hours. Cardiac Enzymes: No results for input(s): CKTOTAL, CKMB, CKMBINDEX, TROPONINI in the last 168 hours. BNP (last 3 results) No results for input(s): PROBNP in the last 8760 hours. HbA1C: No results for input(s): HGBA1C in the last 72 hours. CBG:  Recent Labs Lab 07/23/17 2044 07/24/17 0025 07/24/17 0422 07/24/17 0741 07/24/17 1120  GLUCAP 134* 164* 123* 110* 123*   Lipid Profile: No results for input(s): CHOL, HDL, LDLCALC, TRIG, CHOLHDL, LDLDIRECT in the last 72 hours. Thyroid Function Tests:  Recent  Labs  07/22/17 1815  TSH 1.552   Anemia Panel: No results for input(s): VITAMINB12, FOLATE, FERRITIN, TIBC, IRON, RETICCTPCT in the last 72 hours. Sepsis Labs:  Recent Labs Lab 07/22/17 1418 07/22/17 1703  LATICACIDVEN 2.9* 3.9*    Recent Results (from the past 240 hour(s))  Blood Culture (routine x 2)     Status: None (Preliminary result)   Collection Time: 07/22/17  2:22 PM  Result Value Ref Range Status   Specimen Description BLOOD RIGHT FOREARM  Final   Special Requests   Final    BOTTLES DRAWN AEROBIC AND ANAEROBIC Blood Culture results may not be optimal due to an inadequate volume of blood received in culture bottles   Culture NO GROWTH 2 DAYS  Final   Report Status PENDING  Incomplete  Blood Culture (routine x 2)     Status: None (Preliminary result)   Collection Time: 07/22/17  2:30 PM  Result Value Ref Range Status   Specimen Description BLOOD RIGHT WRIST  Final   Special Requests   Final    BOTTLES  DRAWN AEROBIC AND ANAEROBIC Blood Culture results may not be optimal due to an inadequate volume of blood received in culture bottles   Culture NO GROWTH 2 DAYS  Final   Report Status PENDING  Incomplete  Urine Culture     Status: Abnormal (Preliminary result)   Collection Time: 07/22/17  4:04 PM  Result Value Ref Range Status   Specimen Description URINE, CLEAN CATCH  Final   Special Requests NONE  Final   Culture >=100,000 COLONIES/mL ENTEROCOCCUS FAECALIS (A)  Final   Report Status PENDING  Incomplete  MRSA PCR Screening     Status: None   Collection Time: 07/22/17  9:30 PM  Result Value Ref Range Status   MRSA by PCR NEGATIVE NEGATIVE Final    Comment:        The GeneXpert MRSA Assay (FDA approved for NASAL specimens only), is one component of a comprehensive MRSA colonization surveillance program. It is not intended to diagnose MRSA infection nor to guide or monitor treatment for MRSA infections.          Radiology Studies: Dg Chest Portable 1  View  Result Date: 07/22/2017 CLINICAL DATA:  Fever, confusion, hypertension, diabetes mellitus, Alzheimer's, question pneumonia EXAM: PORTABLE CHEST 1 VIEW COMPARISON:  Portable exam 1545 hours compared to 01/10/2015 FINDINGS: Normal heart size, mediastinal contours, and pulmonary vascularity. Minimal LEFT basilar atelectasis. Lungs otherwise clear. No infiltrate, pleural effusion or pneumothorax. Diffuse osseous demineralization. Comminuted displaced and angulated mid LEFT humeral diaphyseal fracture. IMPRESSION: Minimal LEFT basilar atelectasis. Comminuted displaced and angulated LEFT humeral diaphyseal fracture. Electronically Signed   By: Lavonia Dana M.D.   On: 07/22/2017 16:01        Scheduled Meds:  Continuous Infusions:    LOS: 2 days    Time spent: 27mns    Kalyan Barabas, MD [facility] Pager [phone] If 7PM-7AM, please contact night-coverage [email or web address] Password [email or web address] 07/24/2017, 1:39 PM

## 2017-07-24 NOTE — Clinical Social Work Note (Signed)
Patient Information   Patient Name Caitlin Riddle, Caitlin Riddle ([id number]) Sex Female DOB August 19, 1941 SSN [id number]   Room Bed  A340 A340-01  Patient Demographics   Address [address] [place] [address] Phone [phone] (Home)  Patient Ethnicity & Race   Ethnic Group Patient Race  Not Hispanic or Latino Black or African American  Emergency Contact(s)   Name Relation Home Work Mobile  Caitlin Riddle Legal Guardian [phone]  [phone]  Caitlin Riddle [phone]    Documents on File    Status Date Received Description  Documents for the Patient  EMR Medication Summary Not Received    EMR Problem Summary Not Received    EMR Patient Summary Not Received    Historic Radiology Documentation Not Received    Historic Radiology Documentation Not Received    Caitlin Riddle HIPAA NOTICE OF PRIVACY - Scanned Not Received    Caitlin Riddle E-Signature HIPAA Notice of Privacy Received 01/24/11   Caitlin Riddle E-Signature HIPAA Notice of Privacy Spanish Not Received    Driver's License Not Received    Insurance Card Received 01/10/15 [facility], [place] 04.05.2016  Advance Directives/Living Will/HCPOA/POA Received 09/08/13 Letter of appointment guardian  Insurance Card Received 04/12/13 rfm bg  Financial Application Not Received    Advanced Beneficiary Notice (ABN) Not Received    Insurance Card Not Received    HIM ROI Authorization Not Received (Expired)  [employer] [facility] is requesting records on patient from the past year for review.  AMB Provider Completed Forms Not Received  08/14 FL2  Release of Information Not Received    AMB HH/NH/Hospice Not Received  08/14 orders [name]  AMB Provider Completed Forms Not Received  PC Svc Request [place] [facility]  AMB Provider Completed Forms Not Received  08/14 poc [name]   Guardianship Documents Not Received    AMB HH/NH/Hospice Not Received  08/14 order [name]  [facility]  E-Signature HIPAA Notice of Privacy Received 07/14/13   Insurance Card Received 07/14/13 Medicaid/AARP   E-Signature HIPAA Notice of Privacy Received 09/08/13   Insurance Card Received 09/08/13 AARP/Medicaid  Insurance Card Not Received    AMB Correspondence Not Received  11/14 referral [place] MD, S  AMB HH/NH/Hospice Not Received  12/14 orders [name]  AMB HH/NH/Hospice Not Received  12/14 FACE TO FACE ADV HC  AMB HH/NH/Hospice Not Received  12/14 Orders [name]  Release of Information Not Received    AMB Provider Completed Forms Not Received  12/14 fam med Caitlin Riddle Med  AMB Correspondence Not Received  01/15 OPT [facility]   AMB HH/NH/Hospice Not Received  01/15 PROF COMM ADV HC  AMB HH/NH/Hospice  11/27/13 12/14 POC ADV HC  AMB Provider Completed Forms  11/27/13 Order   AMB HH/NH/Hospice  12/20/13 01/15 POC Adv HC  AMB HH/NH/Hospice  12/20/13 01/15 order Adv HC  AMB Provider Completed Forms  12/20/13 02/15 pharm comm to phys highg  AMB HH/NH/Hospice  12/20/13 01/15 order Adv HC  AMB HH/NH/Hospice  12/20/13 02/15 order [facility]  AMB Provider Completed Forms  12/28/13 03/15 phys order/response [facility]  HIM ROI Authorization (Expired) 12/31/13 RFM  AMB HH/NH/Hospice  01/07/14 03/15 SMN Adv HC  AMB Correspondence  01/07/14 03/15 CMN [facility]/[place] [facility]  AMB HH/NH/Hospice  01/18/14 03/15 POC Adv HC  AMB HH/NH/Hospice  02/01/14 03/15 ORDER [facility]  AMB Correspondence  03/15/14 05/15 DMA FL2 CSC  TRACKS  E-Signature AOB Spanish Not Received  AMB HH/NH/Hospice  05/13/14 AUTHORIZATION/CARE PLAN [name]  AMB Provider Completed Forms  05/16/14 FL-2  HIM ROI Authorization (Expired) 07/10/14 ECS-chart review.  AMB Provider Completed Forms  07/06/14 PCS [facility]  HIM ROI Authorization (Expired) 08/30/14 rfm  HIM ROI Authorization (Expired) 11/11/14 rfm  Other Photo ID Not Received    Guardianship Documents  09/08/13   AMB  HH/NH/Hospice  01/10/15 ACCIDENT/INCIDENT REPORT [name]  AMB Correspondence  02/17/15 PHYSICIAN PROGRESS NOTES Serita Grit MD, S  AMB Correspondence  03/16/15 6/16 PHYSICIAN ORDER HARRISON MD, S  AMB HH/NH/Hospice  04/24/15 MEDICATION ADMINISTRATION RECORD Caitlin Riddle  AMB Correspondence  04/20/15 PROGRESS NOTE HARRISON MD, S  AMB Correspondence  04/20/15 PHYSICIAN ORDER HARRISON MD, S  HIM ROI Authorization (Expired) 03/06/16 Authorization for batch [facility] Medicare Risk Adj Review fbg 03/06/16  HIM ROI Authorization  04/11/16 [facility]/[employer] Medicare Risk Adjustment Review  HIM ROI Authorization (Expired) 04/30/16 Authorization for batch [facility]/[employer] Medicare Risk Adjustment Review fbg  04/30/2016  HIM ROI Authorization  07/14/17 [facility] LICENSURE & CERTIFICATION SECTION  HIM ROI Authorization  07/14/17 [place] Dept. of Health and Health and safety inspector Division of Health Service Regulation  HIM ROI Authorization  07/16/17 [facility] DSS  HIM ROI Authorization  07/24/17 [facility] NURSING HOME LICENSURE & CERTIFICATION SECTION  Insurance Card Not Received (Deleted)    Guardianship Documents Not Received (Deleted)    AMB Correspondence (Deleted) 10/10/13 12/14 fam med Caitlin Riddle Fam Med  AMB Correspondence (Deleted) 11/27/13 Order   AMB Provider Completed Forms (Deleted) 12/28/13 03/15 letter Caitlin Riddle  AMB Patient Logs/Info (Deleted) 12/28/13   Guardianship Documents (Deleted) 09/08/13   AMB Correspondence (Deleted) 02/17/15 PHYSICIAN PROGRESS NOTES [name]  AMB Correspondence (Deleted) 03/16/15 6/16 PROGRESS NOTE HARRISON MD, S  AMB Correspondence (Deleted) 04/24/15 PHYSICIAN ORDER HARRISON MD, S  Patient Photo   Photo of Patient  Patient Photo   Photo of Patient  Documents for the Encounter  AOB (Assignment of Insurance Benefits) Received 07/22/17 unable to obtain  E-signature AOB     MEDICARE RIGHTS Received  07/22/17 unable to obtain  [facility] Rights     ED Patient Billing Extract   ED PB Summary  Cardiac Monitoring Strip Shift Summary  07/23/17   Cardiac Monitoring Strip  07/23/17   EKG  07/23/17   Admission Information   Attending Provider Admitting Provider Admission Type Admission Date/Time  Erick Blinks, MD Eddie North, MD Emergency 07/22/17 1354  Discharge Date Hospital Service Auth/Cert Status Service Area   Internal Medicine Incomplete [place] SERVICE AREA  Unit Room/Bed Admission Status   AP-DEPT 300 A340/A340-01 Admission (Confirmed)   Admission   Complaint  [facility] Account   Name Acct ID Class Status Primary Coverage  [place], Caitlin Riddle [id number] Inpatient Open UNITED HEALTHCARE MEDICARE - [facility] MEDICARE      Guarantor Account (for Hospital Account [id number])   Name Relation to Pt Service Area Active? Acct Type  [place], [place] T Self CHSA Yes Personal/Family  Address Phone    [address] [place], [place] [address] [phone](H)        Coverage Information (for Hospital Account [id number])   1. [facility] MEDICARE/UHC MEDICARE   F/O Payor/Plan Precert #  [facility] MEDICARE/UHC MEDICARE   Subscriber Subscriber #  [place], Caitlin Riddle [id number]  Address Phone  [address] [place], [place] [address] [phone]  2. MEDICAID [place]/MEDICAID OF [place]   F/O Payor/Plan Precert #  MEDICAID [place]/MEDICAID OF [place]  Subscriber Subscriber #  [place], Caitlin Riddle [id number]  Address Phone  [address] [address] [place], [place] [address] [phone]

## 2017-07-24 NOTE — Clinical Social Work Note (Signed)
Per family, attending and Palliative Care NP request, LCSW faxed patient's clinical information to [facility]. There are no beds so patient will have to be GIP protocol.     Tamirra Sienkiewicz, Juleen China, LCSW

## 2017-07-25 DIAGNOSIS — A419 Sepsis, unspecified organism: Principal | ICD-10-CM

## 2017-07-25 DIAGNOSIS — G309 Alzheimer's disease, unspecified: Secondary | ICD-10-CM

## 2017-07-25 DIAGNOSIS — G9341 Metabolic encephalopathy: Secondary | ICD-10-CM

## 2017-07-25 DIAGNOSIS — F0280 Dementia in other diseases classified elsewhere without behavioral disturbance: Secondary | ICD-10-CM

## 2017-07-25 DIAGNOSIS — Z23 Encounter for immunization: Secondary | ICD-10-CM | POA: Diagnosis not present

## 2017-07-25 DIAGNOSIS — N179 Acute kidney failure, unspecified: Secondary | ICD-10-CM

## 2017-07-25 LAB — URINE CULTURE

## 2017-07-25 MED ORDER — ACETAMINOPHEN 325 MG PO TABS: 650.0000 mg | ORAL_TABLET | ORAL | Status: DC | PRN

## 2017-07-25 MED ORDER — LORAZEPAM 1 MG PO TABS: 1.0000 mg | ORAL_TABLET | ORAL | Status: DC | PRN

## 2017-07-25 MED ORDER — LORAZEPAM 2 MG/ML PO CONC
1.0000 mg | ORAL | Status: DC | PRN
Start: 2017-07-25 — End: 2017-07-26

## 2017-07-25 MED ORDER — GLYCOPYRROLATE 0.2 MG/ML IJ SOLN
0.2000 mg | INTRAMUSCULAR | Status: DC | PRN
  Filled 2017-07-25: qty 1

## 2017-07-25 MED ORDER — SODIUM CHLORIDE 0.9% FLUSH: 3.0000 mL | INTRAVENOUS | Status: DC | PRN

## 2017-07-25 MED ORDER — HALOPERIDOL 0.5 MG PO TABS: 0.5000 mg | ORAL_TABLET | ORAL | Status: DC | PRN

## 2017-07-25 MED ORDER — PROPYLENE GLYCOL 0.6 % OP SOLN: 1.0000 [drp] | Freq: Three times a day (TID) | OPHTHALMIC | Status: DC | PRN

## 2017-07-25 MED ORDER — BIOTENE DRY MOUTH MT LIQD: 15.0000 mL | OROMUCOSAL | Status: DC | PRN

## 2017-07-25 MED ORDER — MORPHINE SULFATE (CONCENTRATE) 10 MG/0.5ML PO SOLN: 5.0000 mg | ORAL | Status: DC | PRN

## 2017-07-25 MED ORDER — GLYCOPYRROLATE 1 MG PO TABS: 1.0000 mg | ORAL_TABLET | ORAL | Status: DC | PRN

## 2017-07-25 MED ORDER — PROPYLENE GLYCOL 0.6 % OP SOLN: Freq: Three times a day (TID) | OPHTHALMIC | Status: DC | PRN

## 2017-07-25 MED ORDER — HALOPERIDOL LACTATE 2 MG/ML PO CONC
0.5000 mg | ORAL | Status: DC | PRN
  Filled 2017-07-25: qty 0.3

## 2017-07-25 MED ORDER — ONDANSETRON 4 MG PO TBDP: 4.0000 mg | ORAL_TABLET | Freq: Four times a day (QID) | ORAL | Status: DC | PRN

## 2017-07-25 MED ORDER — LORAZEPAM 2 MG/ML IJ SOLN: 1.0000 mg | INTRAMUSCULAR | Status: DC | PRN

## 2017-07-25 MED ORDER — ACETAMINOPHEN 325 MG PO TABS: 650.0000 mg | ORAL_TABLET | Freq: Four times a day (QID) | ORAL | Status: DC | PRN

## 2017-07-25 MED ORDER — HALOPERIDOL LACTATE 5 MG/ML IJ SOLN
0.5000 mg | INTRAMUSCULAR | Status: DC | PRN
Start: 2017-07-25 — End: 2017-07-27

## 2017-07-25 MED ORDER — SODIUM CHLORIDE 0.9 % IV SOLN: 250.0000 mL | INTRAVENOUS | Status: DC | PRN

## 2017-07-25 MED ORDER — POLYVINYL ALCOHOL 1.4 % OP SOLN: 1.0000 [drp] | Freq: Three times a day (TID) | OPHTHALMIC | Status: DC | PRN

## 2017-07-25 MED ORDER — BISACODYL 10 MG RE SUPP: 10.0000 mg | Freq: Every day | RECTAL | Status: DC | PRN

## 2017-07-25 MED ORDER — POLYVINYL ALCOHOL 1.4 % OP SOLN: 1.0000 [drp] | Freq: Four times a day (QID) | OPHTHALMIC | Status: DC | PRN

## 2017-07-25 MED ORDER — SODIUM CHLORIDE 0.9% FLUSH
3.0000 mL | Freq: Two times a day (BID) | INTRAVENOUS | Status: DC
  Administered 2017-07-25 – 2017-07-26 (×3): 3 mL via INTRAVENOUS

## 2017-07-25 MED ORDER — ONDANSETRON HCL 4 MG PO TABS: 4.0000 mg | ORAL_TABLET | Freq: Four times a day (QID) | ORAL | Status: DC | PRN

## 2017-07-25 MED ORDER — ONDANSETRON HCL 4 MG/2ML IJ SOLN: 4.0000 mg | Freq: Four times a day (QID) | INTRAMUSCULAR | Status: DC | PRN

## 2017-07-25 MED ORDER — ACETAMINOPHEN 650 MG RE SUPP: 650.0000 mg | Freq: Four times a day (QID) | RECTAL | Status: DC | PRN

## 2017-07-25 MED ORDER — ATROPINE SULFATE 1 % OP SOLN
1.0000 [drp] | Freq: Three times a day (TID) | OPHTHALMIC | Status: DC | PRN
  Filled 2017-07-25: qty 2

## 2017-07-25 NOTE — Discharge Summary (Signed)
Physician Discharge Summary  [place] Mullett TDD:[id number] DOB: 01-20-1941 DOA: 07/22/2017  PCP: Patient, No Pcp Per  Admit date: 07/22/2017 Discharge date: 07/25/2017  Admitted From: SNF Disposition:  Residential hospice  Recommendations for Outpatient Follow-up:  1. Patient will be discharged to residential hospice once bed is available   Discharge Condition: hospice CODE STATUS: DNR, comfort care Diet recommendation: regular diet for comfort  Brief/Interim Summary: 76 y/o female with multiple medical problems and advanced dementia, who is bedbound at baseline, presented with worsening mental status. Found to Have acute kidney injury from dehydration, hypernatremia and sepsis from enterococcal UTI. Patient was treated with intravenous hydration and IV antibiotics. Considering her frailty and debilitated state at baseline, her prognosis was felt to be performed. Her by mouth intake remained minimal during the hospitalization. Palliative care was consulted with the patient's family and it was decided to pursue comfort care residential hospice. Medications that are not directly related to her comfortably discontinued. She is currently awaiting a bed at residential hospice.  Discharge Diagnoses:  Principal Problem:   Sepsis (HCC) Active Problems:   Iron deficiency anemia   Alzheimer's disease   Weakness of right leg   Osteoarthritis of right knee   Pressure ulcer of right ankle   Closed right hip fracture (HCC)   CKD (chronic kidney disease) stage 3, GFR 30-59 ml/min (HCC)   Displaced oblique fracture of shaft of humerus, left arm, initial encounter for closed fracture   Type 2 diabetes mellitus with complication, without long-term current use of insulin (HCC)   Severe protein-calorie malnutrition (HCC)   Acute metabolic encephalopathy   AKI (acute kidney injury) (HCC)   Hypernatremia   Goals of care, counseling/discussion   Palliative care encounter   Encounter for hospice  care discussion    Discharge Instructions   Allergies as of 07/25/2017   No Known Allergies     Medication List    STOP taking these medications   allopurinol 100 MG tablet Commonly known as:  ZYLOPRIM   colchicine 0.6 MG tablet   feeding supplement (PRO-STAT SUGAR FREE 64) Liqd   HUMALOG KWIKPEN 100 UNIT/ML KiwkPen Generic drug:  insulin lispro   HYDROcodone-acetaminophen 5-325 MG tablet Commonly known as:  NORCO   levothyroxine 25 MCG tablet Commonly known as:  SYNTHROID, LEVOTHROID   memantine 5 MG tablet Commonly known as:  NAMENDA   metoprolol succinate 25 MG 24 hr tablet Commonly known as:  TOPROL-XL   mirtazapine 7.5 MG tablet Commonly known as:  REMERON   pravastatin 20 MG tablet Commonly known as:  PRAVACHOL   Vitamin D3 5000 units Caps     TAKE these medications   acetaminophen 325 MG tablet Commonly known as:  TYLENOL Take 650 mg by mouth every 4 (four) hours as needed for mild pain or moderate pain.   ondansetron 4 MG tablet Commonly known as:  ZOFRAN Take 4 mg by mouth every 6 (six) hours as needed for nausea or vomiting.   SYSTANE BALANCE OP Apply 1 drop to eye every 8 (eight) hours as needed (for dry eyes).       No Known Allergies  Consultations:  Palliative care   Procedures/Studies: Dg Chest Portable 1 View  Result Date: 07/22/2017 CLINICAL DATA:  Fever, confusion, hypertension, diabetes mellitus, Alzheimer's, question pneumonia EXAM: PORTABLE CHEST 1 VIEW COMPARISON:  Portable exam 1545 hours compared to 01/10/2015 FINDINGS: Normal heart size, mediastinal contours, and pulmonary vascularity. Minimal LEFT basilar atelectasis. Lungs otherwise clear. No infiltrate, pleural effusion or  pneumothorax. Diffuse osseous demineralization. Comminuted displaced and angulated mid LEFT humeral diaphyseal fracture. IMPRESSION: Minimal LEFT basilar atelectasis. Comminuted displaced and angulated LEFT humeral diaphyseal fracture. Electronically  Signed   By: Ulyses Southward M.D.   On: 07/22/2017 16:01   Dg Humerus Left  Result Date: 07/08/2017 CLINICAL DATA:  Initial evaluation for acute pain status post fall. EXAM: LEFT HUMERUS - 2+ VIEW COMPARISON:  None. FINDINGS: There is an acute comminuted fracture involving the midshaft of the left humerus with angulation. Mild lateral and posterior displacement of the main butterfly fragment. Bones are diffusely osteopenic. No soft tissue abnormality. IMPRESSION: Acute comminuted fracture involving the midshaft of the left humerus. Electronically Signed   By: Rise Mu M.D.   On: 07/08/2017 13:52   Xr Humerus Left  Result Date: 07/16/2017 Displaced humeral shaft fracture   (Echo, Carotid, EGD, Colonoscopy, ERCP)    Subjective:   Discharge Exam: Vitals:   07/25/17 0553 07/25/17 1002  BP: (!) 144/62   Pulse: (!) 106   Resp:    Temp: 99.6 F (37.6 C) 97.9 F (36.6 C)  SpO2: 98%    Vitals:   07/24/17 2050 07/24/17 2240 07/25/17 0553 07/25/17 1002  BP:  (!) 105/55 (!) 144/62   Pulse:  98 (!) 106   Resp:  16    Temp:  99.5 F (37.5 C) 99.6 F (37.6 C) 97.9 F (36.6 C)  TempSrc:  Oral Axillary Oral  SpO2: 96% 97% 98%   Weight:      Height:        General: Pt is unresponsive Cardiovascular: tachycardic, S1/S2 +, no rubs, no gallops Respiratory: CTA bilaterally, no wheezing, no rhonchi Abdominal: Soft, NT, ND, bowel sounds + Extremities: no edema, no cyanosis    The results of significant diagnostics from this hospitalization (including imaging, microbiology, ancillary and laboratory) are listed below for reference.     Microbiology: Recent Results (from the past 240 hour(s))  Blood Culture (routine x 2)     Status: None (Preliminary result)   Collection Time: 07/22/17  2:22 PM  Result Value Ref Range Status   Specimen Description BLOOD RIGHT FOREARM  Final   Special Requests   Final    BOTTLES DRAWN AEROBIC AND ANAEROBIC Blood Culture results may not be  optimal due to an inadequate volume of blood received in culture bottles   Culture NO GROWTH 3 DAYS  Final   Report Status PENDING  Incomplete  Blood Culture (routine x 2)     Status: None (Preliminary result)   Collection Time: 07/22/17  2:30 PM  Result Value Ref Range Status   Specimen Description BLOOD RIGHT WRIST  Final   Special Requests   Final    BOTTLES DRAWN AEROBIC AND ANAEROBIC Blood Culture results may not be optimal due to an inadequate volume of blood received in culture bottles   Culture NO GROWTH 3 DAYS  Final   Report Status PENDING  Incomplete  Urine Culture     Status: Abnormal   Collection Time: 07/22/17  4:04 PM  Result Value Ref Range Status   Specimen Description URINE, CLEAN CATCH  Final   Special Requests NONE  Final   Culture >=100,000 COLONIES/mL ENTEROCOCCUS FAECALIS (A)  Final   Report Status 07/25/2017 FINAL  Final   Organism ID, Bacteria ENTEROCOCCUS FAECALIS (A)  Final      Susceptibility   Enterococcus faecalis - MIC*    AMPICILLIN <=2 SENSITIVE Sensitive     LEVOFLOXACIN >=8 RESISTANT Resistant  NITROFURANTOIN 32 SENSITIVE Sensitive     VANCOMYCIN 1 SENSITIVE Sensitive     * >=100,000 COLONIES/mL ENTEROCOCCUS FAECALIS  MRSA PCR Screening     Status: None   Collection Time: 07/22/17  9:30 PM  Result Value Ref Range Status   MRSA by PCR NEGATIVE NEGATIVE Final    Comment:        The GeneXpert MRSA Assay (FDA approved for NASAL specimens only), is one component of a comprehensive MRSA colonization surveillance program. It is not intended to diagnose MRSA infection nor to guide or monitor treatment for MRSA infections.      Labs: BNP (last 3 results) No results for input(s): BNP in the last 8760 hours. Basic Metabolic Panel:  Recent Labs Lab 07/22/17 1410 07/23/17 0521 07/24/17 0407  NA 158* 153* 154*  K 4.5 3.5 2.8*  CL 123* 121* 110  CO2 16* 19* 30  GLUCOSE 212* 396* 100*  BUN 58* 42* 28*  CREATININE 3.30* 2.44* 2.18*   CALCIUM 9.8 8.2* 8.3*   Liver Function Tests:  Recent Labs Lab 07/22/17 1410  AST 26  ALT 15  ALKPHOS 135*  BILITOT 1.0  PROT 8.7*  ALBUMIN 3.4*   No results for input(s): LIPASE, AMYLASE in the last 168 hours. No results for input(s): AMMONIA in the last 168 hours. CBC:  Recent Labs Lab 07/22/17 1410 07/23/17 0521 07/24/17 0407  WBC 13.8* 14.0* 18.1*  NEUTROABS 9.9*  --   --   HGB 11.5* 10.6* 10.2*  HCT 36.0 32.8* 31.7*  MCV 84.1 83.2 83.2  PLT 304 232 201   Cardiac Enzymes: No results for input(s): CKTOTAL, CKMB, CKMBINDEX, TROPONINI in the last 168 hours. BNP: Invalid input(s): POCBNP CBG:  Recent Labs Lab 07/23/17 2044 07/24/17 0025 07/24/17 0422 07/24/17 0741 07/24/17 1120  GLUCAP 134* 164* 123* 110* 123*   D-Dimer No results for input(s): DDIMER in the last 72 hours. Hgb A1c No results for input(s): HGBA1C in the last 72 hours. Lipid Profile No results for input(s): CHOL, HDL, LDLCALC, TRIG, CHOLHDL, LDLDIRECT in the last 72 hours. Thyroid function studies  Recent Labs  07/22/17 1815  TSH 1.552   Anemia work up No results for input(s): VITAMINB12, FOLATE, FERRITIN, TIBC, IRON, RETICCTPCT in the last 72 hours. Urinalysis    Component Value Date/Time   COLORURINE YELLOW 07/22/2017 1403   APPEARANCEUR CLOUDY (A) 07/22/2017 1403   LABSPEC 1.013 07/22/2017 1403   PHURINE 5.0 07/22/2017 1403   GLUCOSEU NEGATIVE 07/22/2017 1403   HGBUR SMALL (A) 07/22/2017 1403   BILIRUBINUR NEGATIVE 07/22/2017 1403   KETONESUR 5 (A) 07/22/2017 1403   PROTEINUR 30 (A) 07/22/2017 1403   UROBILINOGEN 0.2 01/17/2014 1204   NITRITE NEGATIVE 07/22/2017 1403   LEUKOCYTESUR LARGE (A) 07/22/2017 1403   Sepsis Labs Invalid input(s): PROCALCITONIN,  WBC,  LACTICIDVEN Microbiology Recent Results (from the past 240 hour(s))  Blood Culture (routine x 2)     Status: None (Preliminary result)   Collection Time: 07/22/17  2:22 PM  Result Value Ref Range Status    Specimen Description BLOOD RIGHT FOREARM  Final   Special Requests   Final    BOTTLES DRAWN AEROBIC AND ANAEROBIC Blood Culture results may not be optimal due to an inadequate volume of blood received in culture bottles   Culture NO GROWTH 3 DAYS  Final   Report Status PENDING  Incomplete  Blood Culture (routine x 2)     Status: None (Preliminary result)   Collection Time: 07/22/17  2:30  PM  Result Value Ref Range Status   Specimen Description BLOOD RIGHT WRIST  Final   Special Requests   Final    BOTTLES DRAWN AEROBIC AND ANAEROBIC Blood Culture results may not be optimal due to an inadequate volume of blood received in culture bottles   Culture NO GROWTH 3 DAYS  Final   Report Status PENDING  Incomplete  Urine Culture     Status: Abnormal   Collection Time: 07/22/17  4:04 PM  Result Value Ref Range Status   Specimen Description URINE, CLEAN CATCH  Final   Special Requests NONE  Final   Culture >=100,000 COLONIES/mL ENTEROCOCCUS FAECALIS (A)  Final   Report Status 07/25/2017 FINAL  Final   Organism ID, Bacteria ENTEROCOCCUS FAECALIS (A)  Final      Susceptibility   Enterococcus faecalis - MIC*    AMPICILLIN <=2 SENSITIVE Sensitive     LEVOFLOXACIN >=8 RESISTANT Resistant     NITROFURANTOIN 32 SENSITIVE Sensitive     VANCOMYCIN 1 SENSITIVE Sensitive     * >=100,000 COLONIES/mL ENTEROCOCCUS FAECALIS  MRSA PCR Screening     Status: None   Collection Time: 07/22/17  9:30 PM  Result Value Ref Range Status   MRSA by PCR NEGATIVE NEGATIVE Final    Comment:        The GeneXpert MRSA Assay (FDA approved for NASAL specimens only), is one component of a comprehensive MRSA colonization surveillance program. It is not intended to diagnose MRSA infection nor to guide or monitor treatment for MRSA infections.      Time coordinating discharge: Over 30 minutes  SIGNED:   Erick Blinks, MD  [facility] 07/25/2017, 11:48 AM Pager   If 7PM-7AM, please contact  night-coverage [email or web address] Password [email or web address]

## 2017-07-25 NOTE — H&P (Signed)
History and Physical    [place] Carano JKD:[id number] DOB: 05-02-1941 DOA: 07/25/2017  PCP: Patient, No Pcp Per    I have personally briefly reviewed patient's old medical records in [facility]  Chief Complaint: Inpatient hospice admission  HPI: Caitlin Riddle is a 76 y.o. female with medical history significant of advanced dementia, was initially brought to the hospital with sepsis secondary to enterococcal UTI. She was markedly dehydrated due to poor by mouth intake and had acute kidney injury on chronic kidney disease. Her overall prognosis was poor. She was seen by palliative care who recommended residential hospice. Family was agreeable for this. Unfortunately, there is no bed available at residential hospice at this time. Patient is currently under GIP status.   Review of Systems: Patient is unresponsive   Past Medical History:  Diagnosis Date  . Alzheimer disease   . Arthralgia   . Arthritis   . Back pain   . CKD (chronic kidney disease)    stage II  . Dehydration   . Dementia   . Diabetes mellitus   . Dysphagia   . Failure to thrive (0-17)   . Failure to thrive (0-17)   . Falls   . Humerus fracture   . Hyperlipidemia   . Hypertension   . Iron deficiency anemia   . Renal insufficiency   . Rheumatoid arthritis (HCC)   . Thyroid disease   . UTI (urinary tract infection)   . Vision loss of left eye     Past Surgical History:  Procedure Laterality Date  . COLONOSCOPY    . ESOPHAGOGASTRODUODENOSCOPY    . INTRAMEDULLARY (IM) NAIL INTERTROCHANTERIC Right 01/11/2015   Procedure: INTERNAL FIXATION RIGHT HIP;  Surgeon: Vickki Hearing, MD;  Location: [facility];  Service: Orthopedics;  Laterality: Right;  . TOTAL ABDOMINAL HYSTERECTOMY       reports that she has never smoked. She has never used smokeless tobacco. She reports that she does not drink alcohol or use drugs.  No Known Allergies  Family history:No pertinent family history of heart disease,  stroke   Prior to Admission medications   Medication Sig Start Date End Date Taking? Authorizing Provider  acetaminophen (TYLENOL) 325 MG tablet Take 650 mg by mouth every 4 (four) hours as needed for mild pain or moderate pain.   Yes [provider]  ondansetron (ZOFRAN) 4 MG tablet Take 4 mg by mouth every 6 (six) hours as needed for nausea or vomiting.   Yes [provider]  Propylene Glycol (SYSTANE BALANCE OP) Apply 1 drop to eye every 8 (eight) hours as needed (for dry eyes).    Yes [provider]    Physical Exam: There were no vitals filed for this visit.  Constitutional: NAD, calm, comfortable There were no vitals filed for this visit. ENMT: Mucous membranes are dry. Posterior pharynx clear of any exudate or lesions.Normal dentition.  Neck: normal, supple, no masses, no thyromegaly Respiratory: clear to auscultation bilaterally, no wheezing, no crackles. Normal respiratory effort. No accessory muscle use.  Cardiovascular: Regular rate and rhythm, no murmurs / rubs / gallops. No extremity edema. 2+ pedal pulses. No carotid bruits.    Labs on Admission: I have personally reviewed following labs and imaging studies  CBC:  Recent Labs Lab 07/22/17 1410 07/23/17 0521 07/24/17 0407  WBC 13.8* 14.0* 18.1*  NEUTROABS 9.9*  --   --   HGB 11.5* 10.6* 10.2*  HCT 36.0 32.8* 31.7*  MCV 84.1 83.2 83.2  PLT 304 232 201   Basic Metabolic Panel:  Recent Labs Lab 07/22/17 1410 07/23/17 0521 07/24/17 0407  NA 158* 153* 154*  K 4.5 3.5 2.8*  CL 123* 121* 110  CO2 16* 19* 30  GLUCOSE 212* 396* 100*  BUN 58* 42* 28*  CREATININE 3.30* 2.44* 2.18*  CALCIUM 9.8 8.2* 8.3*   GFR: Estimated Creatinine Clearance: 16.2 mL/min (A) (by C-G formula based on SCr of 2.18 mg/dL (H)). Liver Function Tests:  Recent Labs Lab 07/22/17 1410  AST 26  ALT 15  ALKPHOS 135*  BILITOT 1.0  PROT 8.7*  ALBUMIN 3.4*   No results for input(s): LIPASE, AMYLASE in  the last 168 hours. No results for input(s): AMMONIA in the last 168 hours. Coagulation Profile: No results for input(s): INR, PROTIME in the last 168 hours. Cardiac Enzymes: No results for input(s): CKTOTAL, CKMB, CKMBINDEX, TROPONINI in the last 168 hours. BNP (last 3 results) No results for input(s): PROBNP in the last 8760 hours. HbA1C: No results for input(s): HGBA1C in the last 72 hours. CBG:  Recent Labs Lab 07/23/17 2044 07/24/17 0025 07/24/17 0422 07/24/17 0741 07/24/17 1120  GLUCAP 134* 164* 123* 110* 123*   Lipid Profile: No results for input(s): CHOL, HDL, LDLCALC, TRIG, CHOLHDL, LDLDIRECT in the last 72 hours. Thyroid Function Tests: No results for input(s): TSH, T4TOTAL, FREET4, T3FREE, THYROIDAB in the last 72 hours. Anemia Panel: No results for input(s): VITAMINB12, FOLATE, FERRITIN, TIBC, IRON, RETICCTPCT in the last 72 hours. Urine analysis:    Component Value Date/Time   COLORURINE YELLOW 07/22/2017 1403   APPEARANCEUR CLOUDY (A) 07/22/2017 1403   LABSPEC 1.013 07/22/2017 1403   PHURINE 5.0 07/22/2017 1403   GLUCOSEU NEGATIVE 07/22/2017 1403   HGBUR SMALL (A) 07/22/2017 1403   BILIRUBINUR NEGATIVE 07/22/2017 1403   KETONESUR 5 (A) 07/22/2017 1403   PROTEINUR 30 (A) 07/22/2017 1403   UROBILINOGEN 0.2 01/17/2014 1204   NITRITE NEGATIVE 07/22/2017 1403   LEUKOCYTESUR LARGE (A) 07/22/2017 1403    Radiological Exams on Admission: No results found.    Assessment/Plan Active Problems:   Urinary tract infection without hematuria   Alzheimer's disease   CKD (chronic kidney disease) stage 3, GFR 30-59 ml/min (HCC)   Sepsis (HCC)   Type 2 diabetes mellitus with complication, without long-term current use of insulin (HCC)   Acute metabolic encephalopathy   AKI (acute kidney injury) (HCC)    76 year old female with advanced dementia, admitted to the hospital with sepsis, enterococcal urinary tract infection, dehydration and acute kidney injury. She  also has failure to thrive. Long-term prognosis is poor. Family has elected residential hospice. Once a bed is available at the residential hospice facility, she can discharge there. Goal of treatment is comfort measures. She is under GIP status.  DVT prophylaxis: None Code Status: DO NOT RESUSCITATE, comfort measures Family Communication: No family present Disposition Plan: Awaiting a better residential hospice Consults called:  Admission status: Loreli Dollar MD [facility] Pager [phone]  If 7PM-7AM, please contact night-coverage [email or web address] Password [email or web address]  07/25/2017, 6:59 PM

## 2017-07-25 NOTE — Care Management Important Message (Signed)
Important Message  Patient Details  Name: Caitlin Riddle MRN: [id number] Date of Birth: 1940/10/24   Medicare Important Message Given:  Yes    Malcolm Metro, RN 07/25/2017, 10:47 AM

## 2017-07-26 DIAGNOSIS — N183 Chronic kidney disease, stage 3 (moderate): Secondary | ICD-10-CM

## 2017-07-26 NOTE — Plan of Care (Signed)
Problem: Pain Managment: Goal: General experience of comfort will improve Outcome: Progressing Per PAINAD scale pt seems to be comfortable and free of pain. Pt does seem to experience discomfort with turns and baths.

## 2017-07-26 NOTE — Progress Notes (Signed)
Hospice nurse visit made to pt's room 340. No family in room at this time. Pt opens eyes to voice but does not respond in any other way. Lungs clear. Respirations even and unlabored. No display of pain. Pt has sling to left shoulder. +2 edema noted to left arm. HR 113. Pt with low grade fever...99.0 Axillary. Purewick external urinary cathether draining drk yellow urine. Pt is not eating or drinking. No coolness or mottling noted to bilateral hands, knees or feet. Updated pt's nurse, Jillyn Hidden, RN of pt assessment. Pt is currently #1 on the list to transfer to the Hospice Home once a bed becomes available.

## 2017-07-26 NOTE — Progress Notes (Signed)
PROGRESS NOTE    [place] Kohlenberg  XQJ:[id number] DOB: April 19, 1941 DOA: 07/25/2017 PCP: Patient, No Pcp Per   Assessment & Plan:   Active Problems:   Urinary tract infection without hematuria   Alzheimer's disease   CKD (chronic kidney disease) stage 3, GFR 30-59 ml/min (HCC)   Sepsis (HCC)   Type 2 diabetes mellitus with complication, without long-term current use of insulin (HCC)   Acute metabolic encephalopathy   AKI (acute kidney injury) (HCC)   76 year old female with advanced dementia, admitted to the hospital with sepsis, enterococcal urinary tract infection, dehydration and acute kidney injury.  She also has failure to thrive.  Long-term prognosis is poor.  Family has elected residential hospice.  Once a bed is available at the residence of a hospice facility, she will be discharged there.  Goals of treatment is comfort measures.  She is under GIP status.  Had a long conversation with her son today since he was inquiring why the patient was not getting IV fluids.  I explained that patient appears to be actively dying and that IV fluids would not change that.  I explained that IV fluids do not have any effect on her comfort.  He is agreeable.   DVT prophylaxis: none Code Status: DNR comfort measures Family Communication: discussed with son at the bedside Disposition Plan: awaiting bed at residential hospice   Subjective: unresponsive  Objective: Vitals:   07/25/17 2055 07/26/17 0606 07/26/17 1057 07/26/17 1511  BP: (!) 149/77 138/68 (!) 145/76 136/66  Pulse: 100 (!) 101 (!) 113 (!) 111  Resp: 16 16 16 16   Temp: 98.4 F (36.9 C) 98.7 F (37.1 C) 99 F (37.2 C) 98 F (36.7 C)  TempSrc: Axillary Axillary Axillary Axillary  SpO2: 99% 100% 100% 98%    Intake/Output Summary (Last 24 hours) at 07/26/17 1744 Last data filed at 07/25/17 2200  Gross per 24 hour  Intake               10 ml  Output                0 ml  Net               10 ml   There were no vitals  filed for this visit.  Examination:  General exam: Appears calm and comfortable  Respiratory system: Clear to auscultation. Respiratory effort normal. Cardiovascular system: S1 & S2 heard, RRR. No JVD, murmurs, rubs, gallops or clicks. No pedal edema.  Data Reviewed: I have personally reviewed following labs and imaging studies  CBC:  Recent Labs Lab 07/22/17 1410 07/23/17 0521 07/24/17 0407  WBC 13.8* 14.0* 18.1*  NEUTROABS 9.9*  --   --   HGB 11.5* 10.6* 10.2*  HCT 36.0 32.8* 31.7*  MCV 84.1 83.2 83.2  PLT 304 232 201   Basic Metabolic Panel:  Recent Labs Lab 07/22/17 1410 07/23/17 0521 07/24/17 0407  NA 158* 153* 154*  K 4.5 3.5 2.8*  CL 123* 121* 110  CO2 16* 19* 30  GLUCOSE 212* 396* 100*  BUN 58* 42* 28*  CREATININE 3.30* 2.44* 2.18*  CALCIUM 9.8 8.2* 8.3*   GFR: Estimated Creatinine Clearance: 16.2 mL/min (A) (by C-G formula based on SCr of 2.18 mg/dL (H)). Liver Function Tests:  Recent Labs Lab 07/22/17 1410  AST 26  ALT 15  ALKPHOS 135*  BILITOT 1.0  PROT 8.7*  ALBUMIN 3.4*   No results for input(s): LIPASE, AMYLASE in the last 168 hours.  No results for input(s): AMMONIA in the last 168 hours. Coagulation Profile: No results for input(s): INR, PROTIME in the last 168 hours. Cardiac Enzymes: No results for input(s): CKTOTAL, CKMB, CKMBINDEX, TROPONINI in the last 168 hours. BNP (last 3 results) No results for input(s): PROBNP in the last 8760 hours. HbA1C: No results for input(s): HGBA1C in the last 72 hours. CBG:  Recent Labs Lab 07/23/17 2044 07/24/17 0025 07/24/17 0422 07/24/17 0741 07/24/17 1120  GLUCAP 134* 164* 123* 110* 123*   Lipid Profile: No results for input(s): CHOL, HDL, LDLCALC, TRIG, CHOLHDL, LDLDIRECT in the last 72 hours. Thyroid Function Tests: No results for input(s): TSH, T4TOTAL, FREET4, T3FREE, THYROIDAB in the last 72 hours. Anemia Panel: No results for input(s): VITAMINB12, FOLATE, FERRITIN, TIBC, IRON,  RETICCTPCT in the last 72 hours. Sepsis Labs:  Recent Labs Lab 07/22/17 1418 07/22/17 1703  LATICACIDVEN 2.9* 3.9*    Recent Results (from the past 240 hour(s))  Blood Culture (routine x 2)     Status: None (Preliminary result)   Collection Time: 07/22/17  2:22 PM  Result Value Ref Range Status   Specimen Description BLOOD RIGHT FOREARM  Final   Special Requests   Final    BOTTLES DRAWN AEROBIC AND ANAEROBIC Blood Culture results may not be optimal due to an inadequate volume of blood received in culture bottles   Culture NO GROWTH 4 DAYS  Final   Report Status PENDING  Incomplete  Blood Culture (routine x 2)     Status: None (Preliminary result)   Collection Time: 07/22/17  2:30 PM  Result Value Ref Range Status   Specimen Description BLOOD RIGHT WRIST  Final   Special Requests   Final    BOTTLES DRAWN AEROBIC AND ANAEROBIC Blood Culture results may not be optimal due to an inadequate volume of blood received in culture bottles   Culture NO GROWTH 4 DAYS  Final   Report Status PENDING  Incomplete  Urine Culture     Status: Abnormal   Collection Time: 07/22/17  4:04 PM  Result Value Ref Range Status   Specimen Description URINE, CLEAN CATCH  Final   Special Requests NONE  Final   Culture >=100,000 COLONIES/mL ENTEROCOCCUS FAECALIS (A)  Final   Report Status 07/25/2017 FINAL  Final   Organism ID, Bacteria ENTEROCOCCUS FAECALIS (A)  Final      Susceptibility   Enterococcus faecalis - MIC*    AMPICILLIN <=2 SENSITIVE Sensitive     LEVOFLOXACIN >=8 RESISTANT Resistant     NITROFURANTOIN 32 SENSITIVE Sensitive     VANCOMYCIN 1 SENSITIVE Sensitive     * >=100,000 COLONIES/mL ENTEROCOCCUS FAECALIS  MRSA PCR Screening     Status: None   Collection Time: 07/22/17  9:30 PM  Result Value Ref Range Status   MRSA by PCR NEGATIVE NEGATIVE Final    Comment:        The GeneXpert MRSA Assay (FDA approved for NASAL specimens only), is one component of a comprehensive MRSA  colonization surveillance program. It is not intended to diagnose MRSA infection nor to guide or monitor treatment for MRSA infections.          Radiology Studies: No results found.      Scheduled Meds: . sodium chloride flush  3 mL Intravenous Q12H   Continuous Infusions: . sodium chloride       LOS: 1 day    Time spent:15 mins    MEMON,JEHANZEB, MD [facility] Pager [phone]  If 7PM-7AM, please  contact night-coverage [email or web address] Password [email or web address] 07/26/2017, 5:44 PM

## 2017-07-27 DIAGNOSIS — Z7401 Bed confinement status: Secondary | ICD-10-CM | POA: Diagnosis not present

## 2017-07-27 DIAGNOSIS — R279 Unspecified lack of coordination: Secondary | ICD-10-CM | POA: Diagnosis not present

## 2017-07-27 LAB — CULTURE, BLOOD (ROUTINE X 2)
Culture: NO GROWTH
Culture: NO GROWTH

## 2017-07-27 MED ORDER — MORPHINE SULFATE (CONCENTRATE) 10 MG/0.5ML PO SOLN: 5.0000 mg | ORAL | 0 refills | Status: AC | PRN

## 2017-07-27 MED ORDER — LORAZEPAM 1 MG PO TABS
1.0000 mg | ORAL_TABLET | ORAL | 0 refills | Status: AC | PRN
Start: 2017-07-27 — End: ?

## 2017-07-27 NOTE — Discharge Summary (Signed)
Physician Discharge Summary  Caitlin Riddle AVW:[id number] DOB: 1941/08/21 DOA: 07/25/2017  PCP: Patient, No Pcp Per  Admit date: 07/25/2017 Discharge date: 07/27/2017  Admitted From: Skilled nursing facility Disposition: Residential hospice  Recommendations for Outpatient Follow-up:  1. Patient is being discharged to residential hospice for end-of-life care  Discharge Condition: Hospice CODE STATUS: DNR, comfort measures Diet recommendation: Regular foods for comfort  Brief/Interim Summary: Please see discharge summary done on 10/19 for further details regarding her acute illness.  In brief, this is a 76 year old female with advanced dementia, admitted to the hospital with sepsis, enterococcal urinary tract infection, dehydration and acute kidney injury. She also had failure to thrive, was underweight and nonambulatory.  She was treated with IV fluids and antibiotics, but long-term prognosis was poor.  After discussing with palliative care, family elected residential hospice.  Since there were not any beds available at residential hospice facility, she was admitted under GIP status.  Today, a bed is available at residential hospice and she will be discharged there for end-of-life care.  She appears comfortable at this time.  Discharge Diagnoses:  Active Problems:   Urinary tract infection without hematuria   Alzheimer's disease   CKD (chronic kidney disease) stage 3, GFR 30-59 ml/min (HCC)   Sepsis (HCC)   Type 2 diabetes mellitus with complication, without long-term current use of insulin (HCC)   Acute metabolic encephalopathy   AKI (acute kidney injury) [facility])    Discharge Instructions  Discharge Instructions    Diet - low sodium heart healthy    Complete by:  As directed    Increase activity slowly    Complete by:  As directed      Allergies as of 07/27/2017   No Known Allergies     Medication List    TAKE these medications   acetaminophen 325 MG tablet Commonly  known as:  TYLENOL Take 650 mg by mouth every 4 (four) hours as needed for mild pain or moderate pain.   LORazepam 1 MG tablet Commonly known as:  ATIVAN Take 1 tablet (1 mg total) by mouth every 4 (four) hours as needed for anxiety.   morphine CONCENTRATE 10 MG/0.5ML Soln concentrated solution Place 0.25 mLs (5 mg total) under the tongue every 2 (two) hours as needed for moderate pain (or dyspnea).   ondansetron 4 MG tablet Commonly known as:  ZOFRAN Take 4 mg by mouth every 6 (six) hours as needed for nausea or vomiting.   SYSTANE BALANCE OP Apply 1 drop to eye every 8 (eight) hours as needed (for dry eyes).       No Known Allergies  Consultations:     Procedures/Studies: Dg Chest Portable 1 View  Result Date: 07/22/2017 CLINICAL DATA:  Fever, confusion, hypertension, diabetes mellitus, Alzheimer's, question pneumonia EXAM: PORTABLE CHEST 1 VIEW COMPARISON:  Portable exam 1545 hours compared to 01/10/2015 FINDINGS: Normal heart size, mediastinal contours, and pulmonary vascularity. Minimal LEFT basilar atelectasis. Lungs otherwise clear. No infiltrate, pleural effusion or pneumothorax. Diffuse osseous demineralization. Comminuted displaced and angulated mid LEFT humeral diaphyseal fracture. IMPRESSION: Minimal LEFT basilar atelectasis. Comminuted displaced and angulated LEFT humeral diaphyseal fracture. Electronically Signed   By: Ulyses Southward M.D.   On: 07/22/2017 16:01   Dg Humerus Left  Result Date: 07/08/2017 CLINICAL DATA:  Initial evaluation for acute pain status post fall. EXAM: LEFT HUMERUS - 2+ VIEW COMPARISON:  None. FINDINGS: There is an acute comminuted fracture involving the midshaft of the left humerus with angulation. Mild lateral and  posterior displacement of the main butterfly fragment. Bones are diffusely osteopenic. No soft tissue abnormality. IMPRESSION: Acute comminuted fracture involving the midshaft of the left humerus. Electronically Signed   By: Rise Mu M.D.   On: 07/08/2017 13:52   Xr Humerus Left  Result Date: 07/16/2017 Displaced humeral shaft fracture      Subjective: unresponsive  Discharge Exam: Vitals:   07/26/17 2113 07/27/17 0641  BP: 139/62 (!) 177/76  Pulse: (!) 113 (!) 109  Resp: 16 12  Temp: 98.9 F (37.2 C) 98.5 F (36.9 C)  SpO2: 99% 97%   Vitals:   07/26/17 1057 07/26/17 1511 07/26/17 2113 07/27/17 0641  BP: (!) 145/76 136/66 139/62 (!) 177/76  Pulse: (!) 113 (!) 111 (!) 113 (!) 109  Resp: 16 16 16 12   Temp: 99 F (37.2 C) 98 F (36.7 C) 98.9 F (37.2 C) 98.5 F (36.9 C)  TempSrc: Axillary Axillary Axillary Axillary  SpO2: 100% 98% 99% 97%    General: unresponsive, not in acute distress Cardiovascular:tachcyardic, S1/S2 +, no rubs, no gallops Respiratory: CTA bilaterally, no wheezing, no rhonchi Abdominal: Soft, NT, ND, bowel sounds + Extremities: no edema, no cyanosis    The results of significant diagnostics from this hospitalization (including imaging, microbiology, ancillary and laboratory) are listed below for reference.     Microbiology: Recent Results (from the past 240 hour(s))  Blood Culture (routine x 2)     Status: None (Preliminary result)   Collection Time: 07/22/17  2:22 PM  Result Value Ref Range Status   Specimen Description BLOOD RIGHT FOREARM  Final   Special Requests   Final    BOTTLES DRAWN AEROBIC AND ANAEROBIC Blood Culture results may not be optimal due to an inadequate volume of blood received in culture bottles   Culture NO GROWTH 4 DAYS  Final   Report Status PENDING  Incomplete  Blood Culture (routine x 2)     Status: None (Preliminary result)   Collection Time: 07/22/17  2:30 PM  Result Value Ref Range Status   Specimen Description BLOOD RIGHT WRIST  Final   Special Requests   Final    BOTTLES DRAWN AEROBIC AND ANAEROBIC Blood Culture results may not be optimal due to an inadequate volume of blood received in culture bottles   Culture NO GROWTH 4  DAYS  Final   Report Status PENDING  Incomplete  Urine Culture     Status: Abnormal   Collection Time: 07/22/17  4:04 PM  Result Value Ref Range Status   Specimen Description URINE, CLEAN CATCH  Final   Special Requests NONE  Final   Culture >=100,000 COLONIES/mL ENTEROCOCCUS FAECALIS (A)  Final   Report Status 07/25/2017 FINAL  Final   Organism ID, Bacteria ENTEROCOCCUS FAECALIS (A)  Final      Susceptibility   Enterococcus faecalis - MIC*    AMPICILLIN <=2 SENSITIVE Sensitive     LEVOFLOXACIN >=8 RESISTANT Resistant     NITROFURANTOIN 32 SENSITIVE Sensitive     VANCOMYCIN 1 SENSITIVE Sensitive     * >=100,000 COLONIES/mL ENTEROCOCCUS FAECALIS  MRSA PCR Screening     Status: None   Collection Time: 07/22/17  9:30 PM  Result Value Ref Range Status   MRSA by PCR NEGATIVE NEGATIVE Final    Comment:        The GeneXpert MRSA Assay (FDA approved for NASAL specimens only), is one component of a comprehensive MRSA colonization surveillance program. It is not intended to diagnose MRSA infection nor  to guide or monitor treatment for MRSA infections.      Labs: BNP (last 3 results) No results for input(s): BNP in the last 8760 hours. Basic Metabolic Panel:  Recent Labs Lab 07/22/17 1410 07/23/17 0521 07/24/17 0407  NA 158* 153* 154*  K 4.5 3.5 2.8*  CL 123* 121* 110  CO2 16* 19* 30  GLUCOSE 212* 396* 100*  BUN 58* 42* 28*  CREATININE 3.30* 2.44* 2.18*  CALCIUM 9.8 8.2* 8.3*   Liver Function Tests:  Recent Labs Lab 07/22/17 1410  AST 26  ALT 15  ALKPHOS 135*  BILITOT 1.0  PROT 8.7*  ALBUMIN 3.4*   No results for input(s): LIPASE, AMYLASE in the last 168 hours. No results for input(s): AMMONIA in the last 168 hours. CBC:  Recent Labs Lab 07/22/17 1410 07/23/17 0521 07/24/17 0407  WBC 13.8* 14.0* 18.1*  NEUTROABS 9.9*  --   --   HGB 11.5* 10.6* 10.2*  HCT 36.0 32.8* 31.7*  MCV 84.1 83.2 83.2  PLT 304 232 201   Cardiac Enzymes: No results for  input(s): CKTOTAL, CKMB, CKMBINDEX, TROPONINI in the last 168 hours. BNP: Invalid input(s): POCBNP CBG:  Recent Labs Lab 07/23/17 2044 07/24/17 0025 07/24/17 0422 07/24/17 0741 07/24/17 1120  GLUCAP 134* 164* 123* 110* 123*   D-Dimer No results for input(s): DDIMER in the last 72 hours. Hgb A1c No results for input(s): HGBA1C in the last 72 hours. Lipid Profile No results for input(s): CHOL, HDL, LDLCALC, TRIG, CHOLHDL, LDLDIRECT in the last 72 hours. Thyroid function studies No results for input(s): TSH, T4TOTAL, T3FREE, THYROIDAB in the last 72 hours.  Invalid input(s): FREET3 Anemia work up No results for input(s): VITAMINB12, FOLATE, FERRITIN, TIBC, IRON, RETICCTPCT in the last 72 hours. Urinalysis    Component Value Date/Time   COLORURINE YELLOW 07/22/2017 1403   APPEARANCEUR CLOUDY (A) 07/22/2017 1403   LABSPEC 1.013 07/22/2017 1403   PHURINE 5.0 07/22/2017 1403   GLUCOSEU NEGATIVE 07/22/2017 1403   HGBUR SMALL (A) 07/22/2017 1403   BILIRUBINUR NEGATIVE 07/22/2017 1403   KETONESUR 5 (A) 07/22/2017 1403   PROTEINUR 30 (A) 07/22/2017 1403   UROBILINOGEN 0.2 01/17/2014 1204   NITRITE NEGATIVE 07/22/2017 1403   LEUKOCYTESUR LARGE (A) 07/22/2017 1403   Sepsis Labs Invalid input(s): PROCALCITONIN,  WBC,  LACTICIDVEN Microbiology Recent Results (from the past 240 hour(s))  Blood Culture (routine x 2)     Status: None (Preliminary result)   Collection Time: 07/22/17  2:22 PM  Result Value Ref Range Status   Specimen Description BLOOD RIGHT FOREARM  Final   Special Requests   Final    BOTTLES DRAWN AEROBIC AND ANAEROBIC Blood Culture results may not be optimal due to an inadequate volume of blood received in culture bottles   Culture NO GROWTH 4 DAYS  Final   Report Status PENDING  Incomplete  Blood Culture (routine x 2)     Status: None (Preliminary result)   Collection Time: 07/22/17  2:30 PM  Result Value Ref Range Status   Specimen Description BLOOD RIGHT  WRIST  Final   Special Requests   Final    BOTTLES DRAWN AEROBIC AND ANAEROBIC Blood Culture results may not be optimal due to an inadequate volume of blood received in culture bottles   Culture NO GROWTH 4 DAYS  Final   Report Status PENDING  Incomplete  Urine Culture     Status: Abnormal   Collection Time: 07/22/17  4:04 PM  Result Value Ref Range  Status   Specimen Description URINE, CLEAN CATCH  Final   Special Requests NONE  Final   Culture >=100,000 COLONIES/mL ENTEROCOCCUS FAECALIS (A)  Final   Report Status 07/25/2017 FINAL  Final   Organism ID, Bacteria ENTEROCOCCUS FAECALIS (A)  Final      Susceptibility   Enterococcus faecalis - MIC*    AMPICILLIN <=2 SENSITIVE Sensitive     LEVOFLOXACIN >=8 RESISTANT Resistant     NITROFURANTOIN 32 SENSITIVE Sensitive     VANCOMYCIN 1 SENSITIVE Sensitive     * >=100,000 COLONIES/mL ENTEROCOCCUS FAECALIS  MRSA PCR Screening     Status: None   Collection Time: 07/22/17  9:30 PM  Result Value Ref Range Status   MRSA by PCR NEGATIVE NEGATIVE Final    Comment:        The GeneXpert MRSA Assay (FDA approved for NASAL specimens only), is one component of a comprehensive MRSA colonization surveillance program. It is not intended to diagnose MRSA infection nor to guide or monitor treatment for MRSA infections.      Time coordinating discharge: Over 30 minutes  SIGNED:   Erick Blinks, MD  [facility] 07/27/2017, 9:27 AM Pager   If 7PM-7AM, please contact night-coverage [email or web address] Password [email or web address]

## 2017-07-27 NOTE — Progress Notes (Signed)
Patient discharged to hospice house.  Report given to hospice house RN.  EMS transport to facility.

## 2017-08-07 DEATH — deceased

## 2017-08-28 IMAGING — DX DG TIBIA/FIBULA 2V*R*
3 series · 3 of 3 positions shown · non-contrast
Comparison: None.

CLINICAL DATA: Right leg pain

EXAM:
RIGHT TIBIA AND FIBULA - 2 VIEW

[tibia ap]
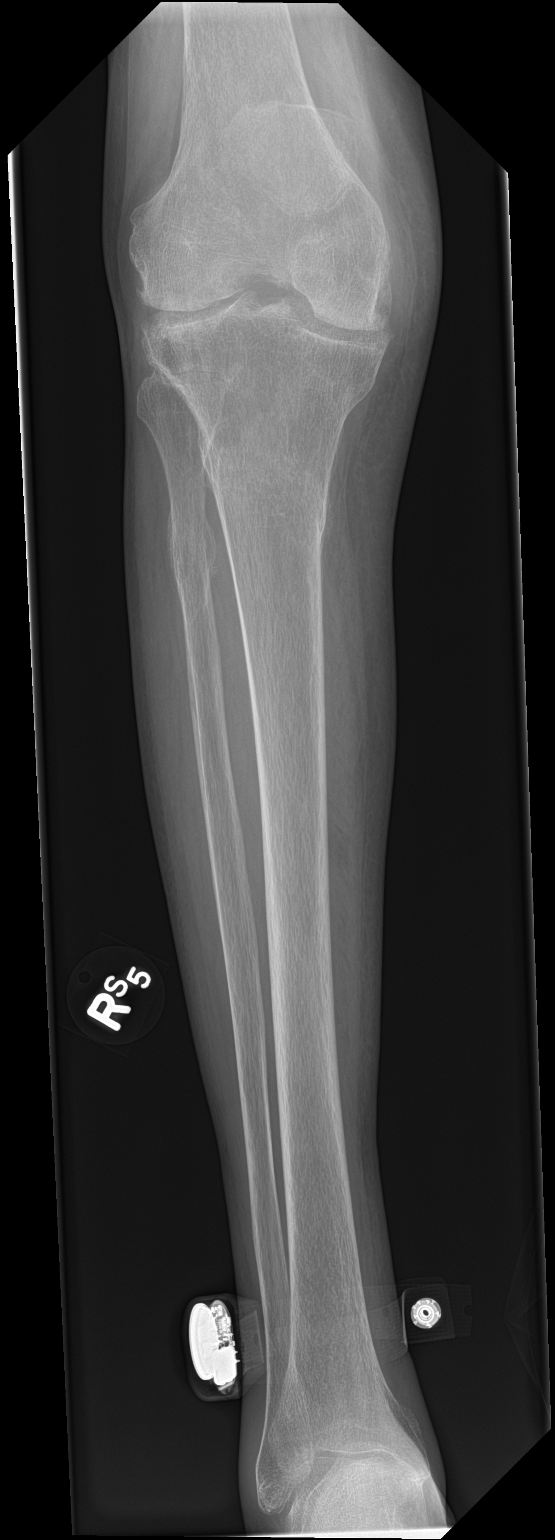

[tibia lat (1 of 2)]
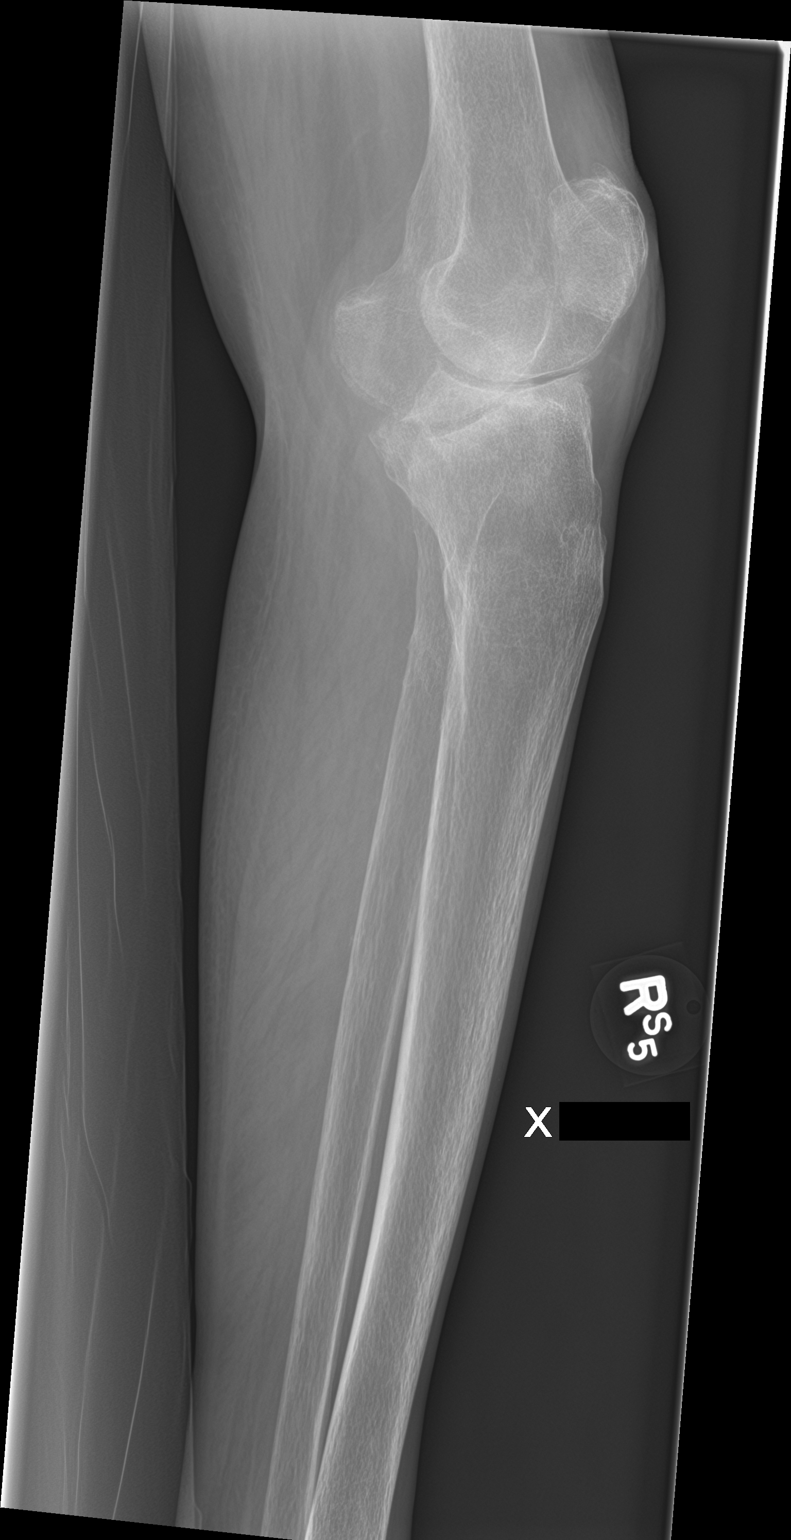

[tibia lat (2 of 2)]
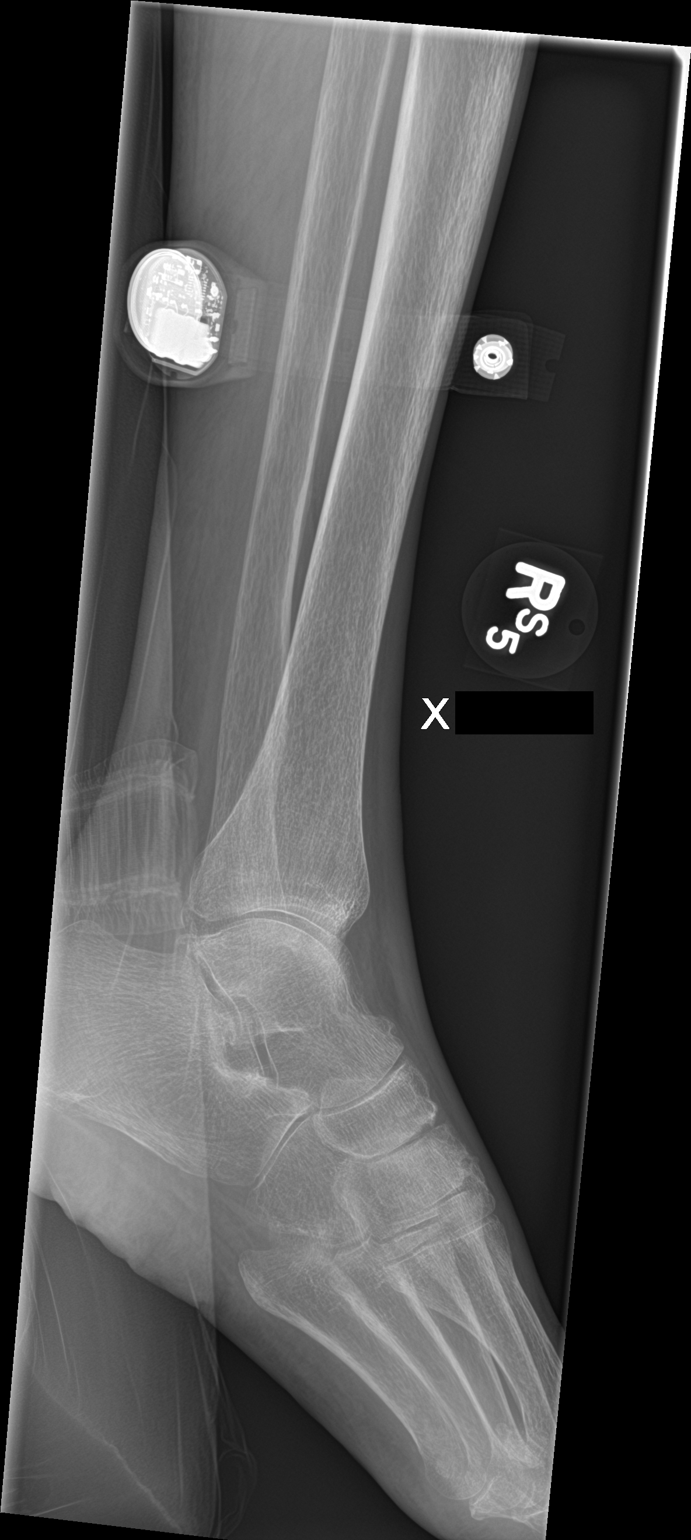

[3 of 3 positions shown; findings below may reference images not displayed]

FINDINGS: No acute fracture. No dislocation. There is deformity of the
proximal tibial and fibula which have a chronic appearance.
Osteopenia.
IMPRESSION: No acute bony pathology.

## 2020-08-16 ENCOUNTER — Encounter: Payer: Self-pay | Admitting: Internal Medicine
